# Patient Record
Sex: Male | Born: 1935
Health system: Southern US, Community
[De-identification: ages and names within clinical notes are randomized; demographics above are authoritative.]

## PROBLEM LIST (undated history)

## (undated) DIAGNOSIS — Z789 Other specified health status: Secondary | ICD-10-CM

## (undated) DIAGNOSIS — R21 Rash and other nonspecific skin eruption: Secondary | ICD-10-CM

## (undated) DIAGNOSIS — C61 Malignant neoplasm of prostate: Secondary | ICD-10-CM

## (undated) DIAGNOSIS — E785 Hyperlipidemia, unspecified: Secondary | ICD-10-CM

## (undated) DIAGNOSIS — I739 Peripheral vascular disease, unspecified: Secondary | ICD-10-CM

## (undated) DIAGNOSIS — I779 Disorder of arteries and arterioles, unspecified: Secondary | ICD-10-CM

## (undated) DIAGNOSIS — J449 Chronic obstructive pulmonary disease, unspecified: Secondary | ICD-10-CM

## (undated) DIAGNOSIS — E7800 Pure hypercholesterolemia, unspecified: Secondary | ICD-10-CM

## (undated) DIAGNOSIS — I10 Essential (primary) hypertension: Secondary | ICD-10-CM

## (undated) DIAGNOSIS — G8929 Other chronic pain: Secondary | ICD-10-CM

## (undated) DIAGNOSIS — F419 Anxiety disorder, unspecified: Secondary | ICD-10-CM

## (undated) DIAGNOSIS — K219 Gastro-esophageal reflux disease without esophagitis: Secondary | ICD-10-CM

## (undated) DIAGNOSIS — E1142 Type 2 diabetes mellitus with diabetic polyneuropathy: Principal | ICD-10-CM

## (undated) DIAGNOSIS — I4891 Unspecified atrial fibrillation: Secondary | ICD-10-CM

## (undated) DIAGNOSIS — Z951 Presence of aortocoronary bypass graft: Secondary | ICD-10-CM

## (undated) DIAGNOSIS — R9430 Abnormal result of cardiovascular function study, unspecified: Secondary | ICD-10-CM

## (undated) DIAGNOSIS — I2510 Atherosclerotic heart disease of native coronary artery without angina pectoris: Secondary | ICD-10-CM

## (undated) DIAGNOSIS — R519 Headache, unspecified: Secondary | ICD-10-CM

## (undated) DIAGNOSIS — K449 Diaphragmatic hernia without obstruction or gangrene: Secondary | ICD-10-CM

## (undated) DIAGNOSIS — D472 Monoclonal gammopathy: Secondary | ICD-10-CM

## (undated) DIAGNOSIS — IMO0002 Reserved for concepts with insufficient information to code with codable children: Secondary | ICD-10-CM

## (undated) DIAGNOSIS — R51 Headache: Secondary | ICD-10-CM

## (undated) HISTORY — PX: PROSTATE SURGERY: SHX751

## (undated) HISTORY — DX: Other chronic pain: G89.29

## (undated) HISTORY — DX: Abnormal result of cardiovascular function study, unspecified: R94.30

## (undated) HISTORY — DX: Anxiety disorder, unspecified: F41.9

## (undated) HISTORY — DX: Malignant neoplasm of prostate: C61

## (undated) HISTORY — DX: Hyperlipidemia, unspecified: E78.5

## (undated) HISTORY — DX: Atherosclerotic heart disease of native coronary artery without angina pectoris: I25.10

## (undated) HISTORY — DX: Other specified health status: Z78.9

## (undated) HISTORY — DX: Headache, unspecified: R51.9

## (undated) HISTORY — DX: Type 2 diabetes mellitus with diabetic polyneuropathy: E11.42

## (undated) HISTORY — PX: CARDIAC SURGERY: SHX584

## (undated) HISTORY — DX: Monoclonal gammopathy: D47.2

## (undated) HISTORY — DX: Headache: R51

## (undated) HISTORY — DX: Presence of aortocoronary bypass graft: Z95.1

## (undated) HISTORY — DX: Diaphragmatic hernia without obstruction or gangrene: K44.9

## (undated) HISTORY — DX: Pure hypercholesterolemia, unspecified: E78.00

## (undated) HISTORY — DX: Essential (primary) hypertension: I10

## (undated) HISTORY — DX: Unspecified atrial fibrillation: I48.91

## (undated) HISTORY — DX: Gastro-esophageal reflux disease without esophagitis: K21.9

## (undated) HISTORY — DX: Reserved for concepts with insufficient information to code with codable children: IMO0002

## (undated) HISTORY — DX: Chronic obstructive pulmonary disease, unspecified: J44.9

## (undated) HISTORY — DX: Disorder of arteries and arterioles, unspecified: I77.9

## (undated) HISTORY — PX: BLADDER SURGERY: SHX569

## (undated) HISTORY — DX: Rash and other nonspecific skin eruption: R21

## (undated) HISTORY — DX: Peripheral vascular disease, unspecified: I73.9

---

## 2001-04-24 ENCOUNTER — Encounter: Payer: Self-pay | Admitting: *Deleted

## 2001-04-25 ENCOUNTER — Inpatient Hospital Stay (HOSPITAL_COMMUNITY): Admission: RE | Admit: 2001-04-25 | Discharge: 2001-05-01 | Payer: Self-pay | Admitting: *Deleted

## 2001-04-26 ENCOUNTER — Encounter: Payer: Self-pay | Admitting: Thoracic Surgery (Cardiothoracic Vascular Surgery)

## 2001-04-27 ENCOUNTER — Encounter: Payer: Self-pay | Admitting: Thoracic Surgery (Cardiothoracic Vascular Surgery)

## 2001-04-28 ENCOUNTER — Encounter: Payer: Self-pay | Admitting: Thoracic Surgery (Cardiothoracic Vascular Surgery)

## 2001-04-29 ENCOUNTER — Encounter: Payer: Self-pay | Admitting: Thoracic Surgery (Cardiothoracic Vascular Surgery)

## 2001-06-05 ENCOUNTER — Encounter (HOSPITAL_COMMUNITY): Admission: RE | Admit: 2001-06-05 | Discharge: 2001-09-03 | Payer: Self-pay | Admitting: Cardiology

## 2001-07-19 ENCOUNTER — Encounter: Admission: RE | Admit: 2001-07-19 | Discharge: 2001-07-19 | Payer: Self-pay | Admitting: Urology

## 2001-07-19 ENCOUNTER — Encounter: Payer: Self-pay | Admitting: Urology

## 2001-07-23 ENCOUNTER — Ambulatory Visit (HOSPITAL_BASED_OUTPATIENT_CLINIC_OR_DEPARTMENT_OTHER): Admission: RE | Admit: 2001-07-23 | Discharge: 2001-07-23 | Payer: Self-pay | Admitting: Urology

## 2001-07-23 ENCOUNTER — Encounter: Payer: Self-pay | Admitting: Urology

## 2001-07-23 ENCOUNTER — Encounter (INDEPENDENT_AMBULATORY_CARE_PROVIDER_SITE_OTHER): Payer: Self-pay

## 2002-10-03 ENCOUNTER — Ambulatory Visit (HOSPITAL_BASED_OUTPATIENT_CLINIC_OR_DEPARTMENT_OTHER): Admission: RE | Admit: 2002-10-03 | Discharge: 2002-10-03 | Payer: Self-pay | Admitting: Urology

## 2002-10-03 ENCOUNTER — Encounter (INDEPENDENT_AMBULATORY_CARE_PROVIDER_SITE_OTHER): Payer: Self-pay | Admitting: *Deleted

## 2004-02-03 ENCOUNTER — Encounter (INDEPENDENT_AMBULATORY_CARE_PROVIDER_SITE_OTHER): Payer: Self-pay | Admitting: Specialist

## 2004-02-03 ENCOUNTER — Ambulatory Visit (HOSPITAL_COMMUNITY): Admission: RE | Admit: 2004-02-03 | Discharge: 2004-02-03 | Payer: Self-pay | Admitting: Gastroenterology

## 2004-10-07 ENCOUNTER — Encounter: Admission: RE | Admit: 2004-10-07 | Discharge: 2004-10-07 | Payer: Self-pay | Admitting: Urology

## 2004-10-14 ENCOUNTER — Ambulatory Visit (HOSPITAL_BASED_OUTPATIENT_CLINIC_OR_DEPARTMENT_OTHER): Admission: RE | Admit: 2004-10-14 | Discharge: 2004-10-14 | Payer: Self-pay | Admitting: Urology

## 2004-10-14 ENCOUNTER — Ambulatory Visit (HOSPITAL_COMMUNITY): Admission: RE | Admit: 2004-10-14 | Discharge: 2004-10-14 | Payer: Self-pay | Admitting: Urology

## 2005-06-14 ENCOUNTER — Ambulatory Visit: Payer: Self-pay | Admitting: Cardiology

## 2005-08-15 ENCOUNTER — Ambulatory Visit (HOSPITAL_BASED_OUTPATIENT_CLINIC_OR_DEPARTMENT_OTHER): Admission: RE | Admit: 2005-08-15 | Discharge: 2005-08-15 | Payer: Self-pay | Admitting: Urology

## 2005-08-15 ENCOUNTER — Ambulatory Visit (HOSPITAL_COMMUNITY): Admission: RE | Admit: 2005-08-15 | Discharge: 2005-08-15 | Payer: Self-pay | Admitting: Urology

## 2005-08-15 ENCOUNTER — Encounter (INDEPENDENT_AMBULATORY_CARE_PROVIDER_SITE_OTHER): Payer: Self-pay | Admitting: *Deleted

## 2006-06-14 ENCOUNTER — Ambulatory Visit: Payer: Self-pay | Admitting: Cardiology

## 2006-08-30 ENCOUNTER — Ambulatory Visit (HOSPITAL_COMMUNITY): Admission: RE | Admit: 2006-08-30 | Discharge: 2006-08-30 | Payer: Self-pay | Admitting: Urology

## 2006-09-25 ENCOUNTER — Ambulatory Visit: Admission: RE | Admit: 2006-09-25 | Discharge: 2007-02-21 | Payer: Self-pay | Admitting: Radiation Oncology

## 2006-12-21 ENCOUNTER — Ambulatory Visit: Admission: RE | Admit: 2006-12-21 | Discharge: 2007-02-21 | Payer: Self-pay | Admitting: Radiation Oncology

## 2007-02-18 ENCOUNTER — Encounter: Admission: RE | Admit: 2007-02-18 | Discharge: 2007-02-18 | Payer: Self-pay | Admitting: Surgery

## 2007-02-25 ENCOUNTER — Ambulatory Visit (HOSPITAL_BASED_OUTPATIENT_CLINIC_OR_DEPARTMENT_OTHER): Admission: RE | Admit: 2007-02-25 | Discharge: 2007-02-25 | Payer: Self-pay | Admitting: Urology

## 2007-03-18 ENCOUNTER — Ambulatory Visit: Admission: RE | Admit: 2007-03-18 | Discharge: 2007-04-17 | Payer: Self-pay | Admitting: Radiation Oncology

## 2007-07-02 ENCOUNTER — Ambulatory Visit: Payer: Self-pay | Admitting: Cardiology

## 2007-09-25 ENCOUNTER — Ambulatory Visit: Payer: Self-pay

## 2007-10-01 ENCOUNTER — Inpatient Hospital Stay (HOSPITAL_COMMUNITY): Admission: EM | Admit: 2007-10-01 | Discharge: 2007-10-02 | Payer: Self-pay | Admitting: *Deleted

## 2007-10-01 ENCOUNTER — Encounter: Payer: Self-pay | Admitting: Cardiology

## 2007-10-01 ENCOUNTER — Ambulatory Visit: Payer: Self-pay | Admitting: Internal Medicine

## 2007-10-11 ENCOUNTER — Ambulatory Visit: Payer: Self-pay

## 2007-10-15 ENCOUNTER — Ambulatory Visit: Payer: Self-pay | Admitting: Cardiology

## 2007-11-19 ENCOUNTER — Ambulatory Visit: Payer: Self-pay | Admitting: Cardiology

## 2007-12-04 ENCOUNTER — Ambulatory Visit: Payer: Self-pay | Admitting: Cardiology

## 2007-12-30 ENCOUNTER — Ambulatory Visit: Payer: Self-pay | Admitting: Cardiology

## 2008-02-05 ENCOUNTER — Ambulatory Visit: Payer: Self-pay | Admitting: Cardiology

## 2008-04-14 ENCOUNTER — Ambulatory Visit: Payer: Self-pay

## 2008-04-14 ENCOUNTER — Encounter: Payer: Self-pay | Admitting: Cardiology

## 2008-07-30 ENCOUNTER — Ambulatory Visit: Payer: Self-pay | Admitting: Cardiology

## 2008-09-09 ENCOUNTER — Emergency Department (HOSPITAL_COMMUNITY): Admission: EM | Admit: 2008-09-09 | Discharge: 2008-09-09 | Payer: Self-pay | Admitting: Emergency Medicine

## 2008-09-19 ENCOUNTER — Emergency Department (HOSPITAL_COMMUNITY): Admission: EM | Admit: 2008-09-19 | Discharge: 2008-09-19 | Payer: Self-pay | Admitting: Emergency Medicine

## 2008-10-14 ENCOUNTER — Ambulatory Visit: Payer: Self-pay

## 2009-01-21 ENCOUNTER — Ambulatory Visit: Payer: Self-pay | Admitting: Cardiology

## 2009-04-14 ENCOUNTER — Encounter: Payer: Self-pay | Admitting: Cardiology

## 2009-04-14 ENCOUNTER — Ambulatory Visit: Payer: Self-pay

## 2009-07-19 ENCOUNTER — Encounter: Payer: Self-pay | Admitting: Cardiology

## 2009-07-20 ENCOUNTER — Ambulatory Visit: Payer: Self-pay | Admitting: Cardiology

## 2009-10-26 ENCOUNTER — Encounter: Payer: Self-pay | Admitting: Cardiology

## 2009-10-27 ENCOUNTER — Encounter: Payer: Self-pay | Admitting: Cardiology

## 2009-10-27 ENCOUNTER — Ambulatory Visit: Payer: Self-pay

## 2010-02-17 ENCOUNTER — Encounter: Payer: Self-pay | Admitting: Cardiology

## 2010-02-18 ENCOUNTER — Ambulatory Visit: Payer: Self-pay | Admitting: Cardiology

## 2010-04-27 ENCOUNTER — Encounter: Payer: Self-pay | Admitting: Cardiology

## 2010-04-28 ENCOUNTER — Ambulatory Visit: Payer: Self-pay

## 2010-04-28 ENCOUNTER — Encounter: Payer: Self-pay | Admitting: Cardiology

## 2010-07-09 IMAGING — CT CT HEAD W/O CM
3 series · 17 of 37 positions shown, 19 images · non-contrast
Comparison: None

CT HEAD

CLINICAL DATA: Fall.

CT HEAD WITHOUT CONTRAST
CT CERVICAL SPINE WITHOUT CONTRAST
TECHNIQUE: Multidetector CT imaging of the head and cervical spine
was performed following the standard protocol without intravenous
contrast.  Multiplanar CT image reconstructions of the cervical
spine were also generated.

[Series 3: (id) head 4.8 h37s st · axial · 0.50mm/px · z∈[+1126,+1253]mm · 6 of 36 slices shown, 8 images]
[im 6/36  brain]
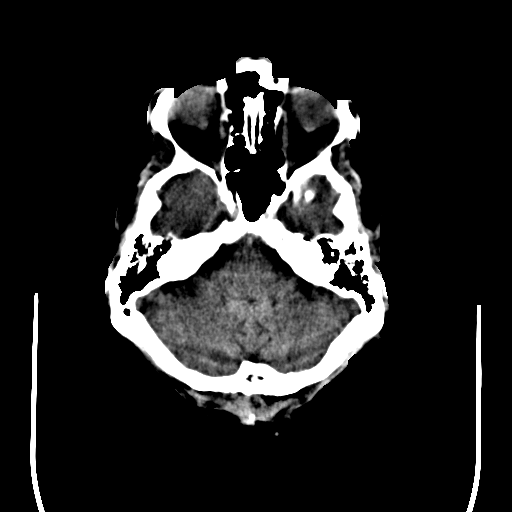
[im 6/36  bone]
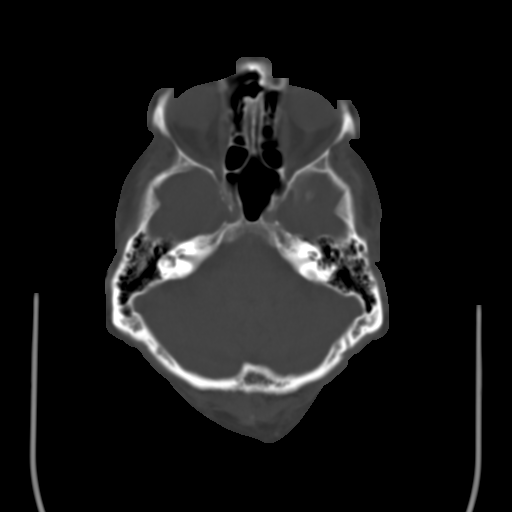
[im 11/36  brain]
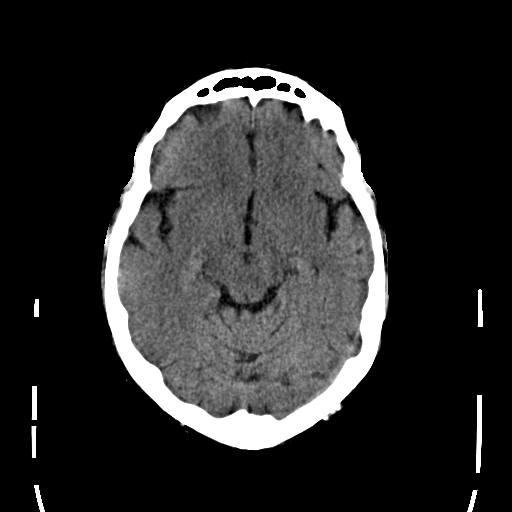
[im 16/36  brain]
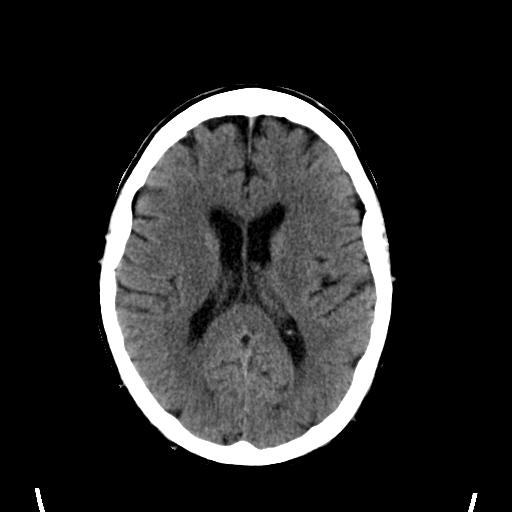
[im 21/36  brain]
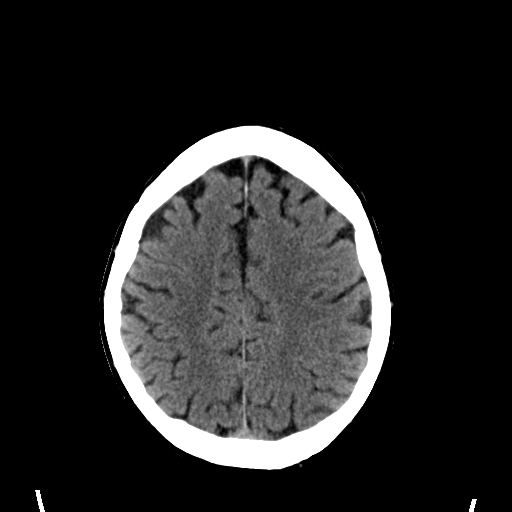
[im 26/36  brain]
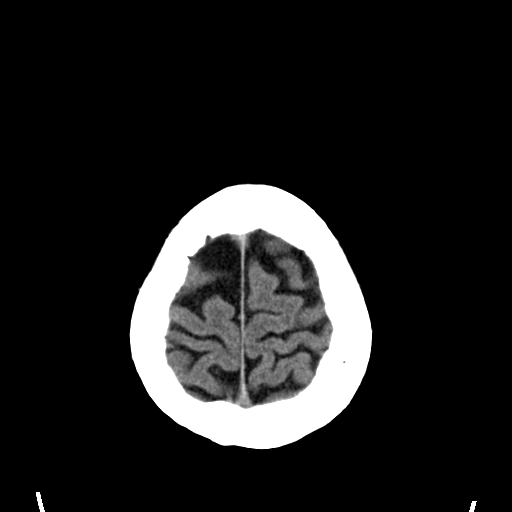
[im 26/36  bone]
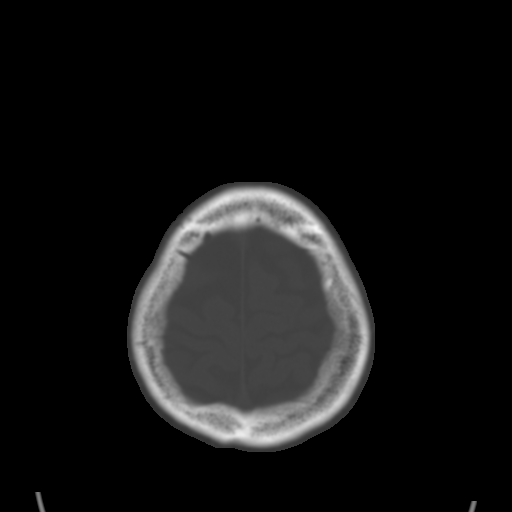
[im 31/36  brain]
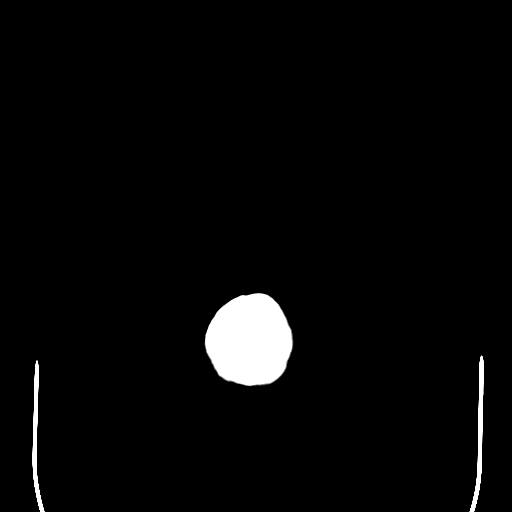

[Series 6: c_spine 2.0 b31s detail · axial · 0.27mm/px · z∈[+925,+1077]mm · 8 of 94 slices shown]
[im 9/94  bone]
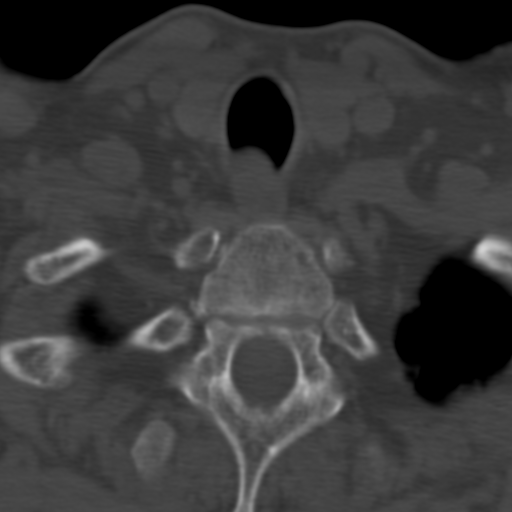
[im 18/94  bone]
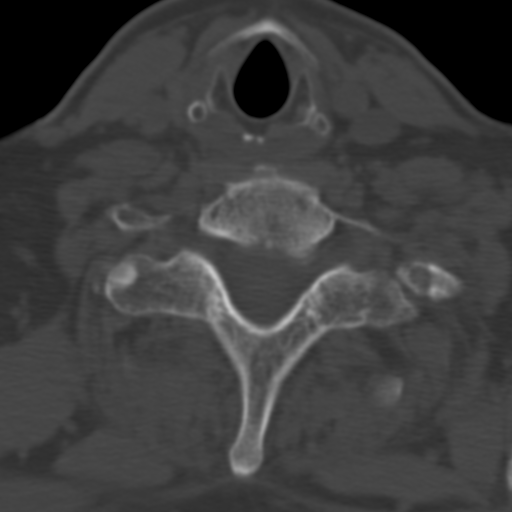
[im 32/94  bone]
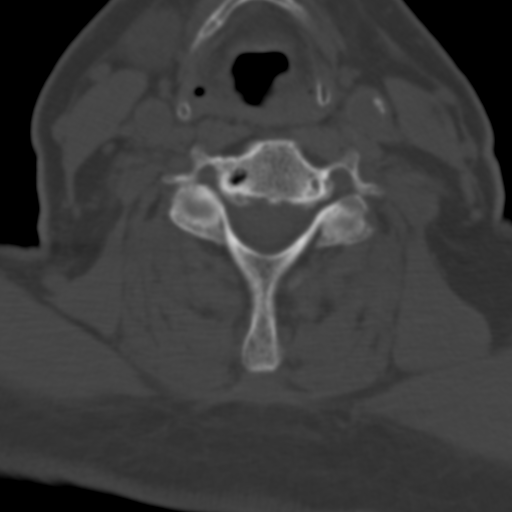
[im 40/94  bone]
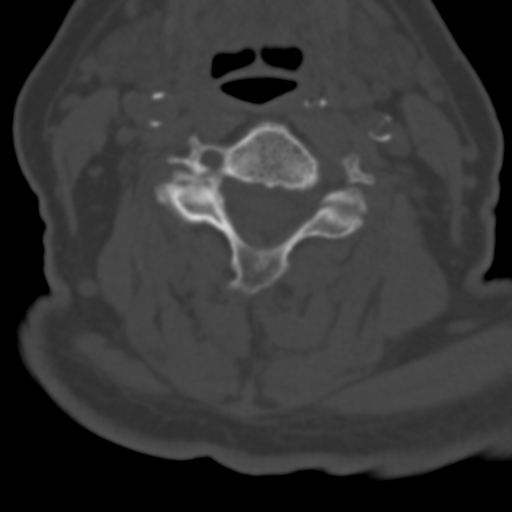
[im 54/94  bone]
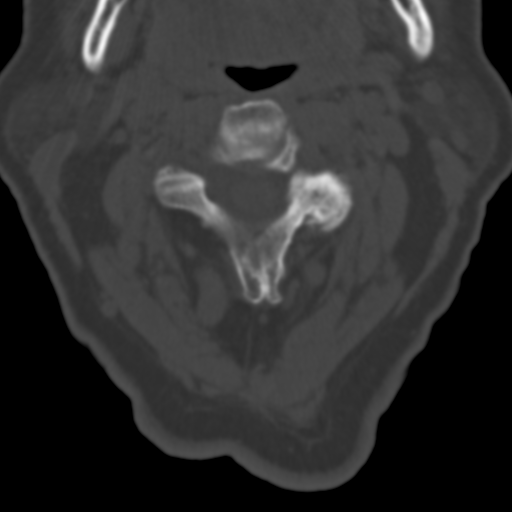
[im 63/94  bone]
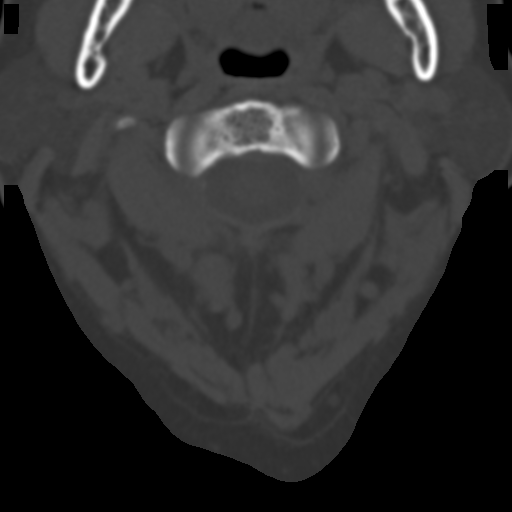
[im 76/94  bone]
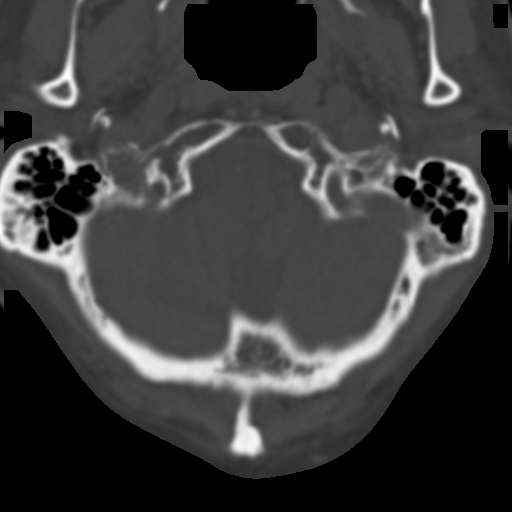
[im 85/94  bone]
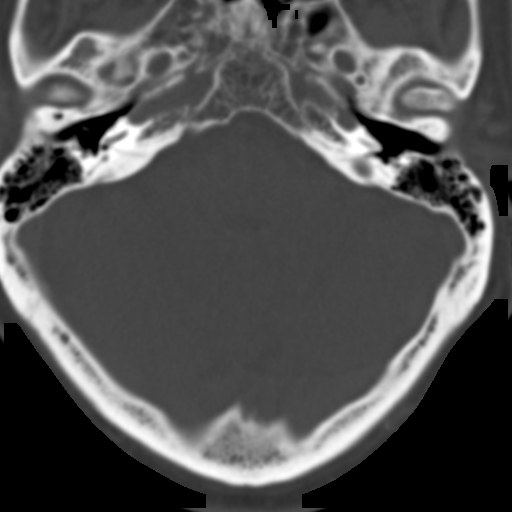

[Series 10: c_spine 3.0 spo sag thins · sagittal · 0.37mm/px · 3 of 39 slices shown]
[im 13/39  brain]
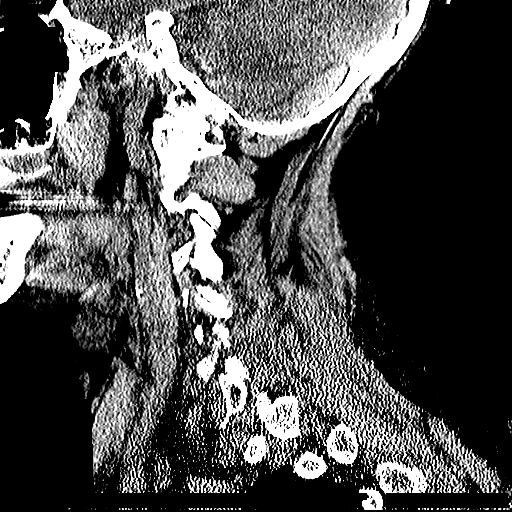
[im 20/39  brain]
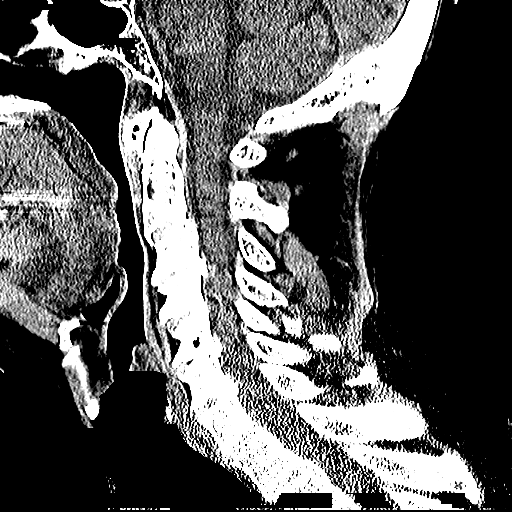
[im 26/39  brain]
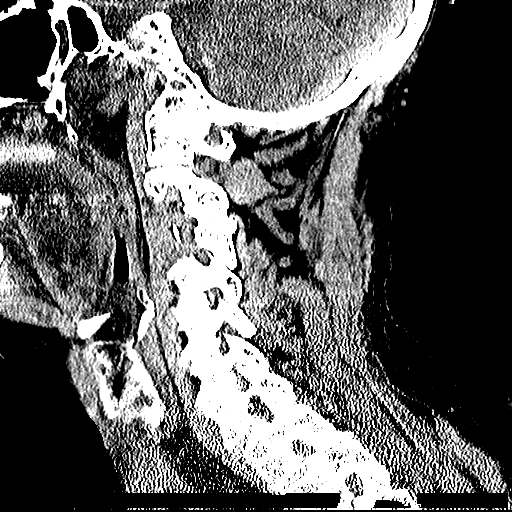

[17 of 37 positions shown; findings below may reference images not displayed]

FINDINGS: The ventricles are normal.  Negative for intracranial
hemorrhage.  Small area of  ischemia in the right internal capsule
is probably chronic.  There is no acute infarct or mass.  Negative
for skull fracture.
IMPRESSION: No acute abnormality.

CT CERVICAL SPINE
FINDINGS: Negative for fracture.  Disc degeneration and spondylosis
is noted at C3-4 especially on the right.  There is diffuse
uncinate spurring at C5-6 and C6-7.  There is facet degeneration at
multiple levels.  There is mild anterior slip of C2 and C3.
IMPRESSION: Negative for fracture.

## 2010-08-17 ENCOUNTER — Encounter: Payer: Self-pay | Admitting: Cardiology

## 2010-08-18 ENCOUNTER — Ambulatory Visit: Payer: Self-pay | Admitting: Cardiology

## 2010-08-18 ENCOUNTER — Encounter: Payer: Self-pay | Admitting: Cardiology

## 2010-08-29 ENCOUNTER — Telehealth (INDEPENDENT_AMBULATORY_CARE_PROVIDER_SITE_OTHER): Payer: Self-pay | Admitting: Radiology

## 2010-08-30 ENCOUNTER — Encounter: Payer: Self-pay | Admitting: Cardiology

## 2010-08-30 ENCOUNTER — Encounter (HOSPITAL_COMMUNITY)
Admission: RE | Admit: 2010-08-30 | Discharge: 2010-10-18 | Payer: Self-pay | Source: Home / Self Care | Attending: Cardiology | Admitting: Cardiology

## 2010-08-30 ENCOUNTER — Ambulatory Visit: Payer: Self-pay

## 2010-09-21 ENCOUNTER — Encounter: Payer: Self-pay | Admitting: Cardiology

## 2010-09-22 ENCOUNTER — Ambulatory Visit
Admission: RE | Admit: 2010-09-22 | Discharge: 2010-09-22 | Payer: Self-pay | Source: Home / Self Care | Attending: Cardiology | Admitting: Cardiology

## 2010-09-22 ENCOUNTER — Encounter: Payer: Self-pay | Admitting: Cardiology

## 2010-10-18 NOTE — Miscellaneous (Signed)
  Clinical Lists Changes  Observations: Added new observation of US CAROTID: Stable, carotid artery disease, bilaterally. 0-39% RICA stenosis 60-79% LICA stenosis  f/u 6 months (10/27/2009 10:37)      Carotid Doppler  Procedure date:  10/27/2009  Findings:      Stable, carotid artery disease, bilaterally. 0-39% RICA stenosis 60-79% LICA stenosis  f/u 6 months

## 2010-10-18 NOTE — Assessment & Plan Note (Signed)
Summary: f/u 6 months   Visit Type:  Follow-up Primary Provider:  Benedetto Goad MD  CC:  CAD.  History of Present Illness: The patient is seen for cardiology followup.  He's having some mild chest discomfort.  It is not clear if it's GI or cardiac.  He is able to exercise 3 days a week at the [facility].  Current Medications (verified): 1)  Plavix 75 Mg Tabs (Clopidogrel Bisulfate) .... Take 1 Tablet By Mouth Once A Day 2)  Triamterene-Hctz 37.5-25 Mg Tabs (Triamterene-Hctz) .... Take 1 Tablet By Mouth Once A Day 3)  Calcium 500/vitamin D 500-125 Mg-Unit Tabs (Calcium Carbonate-Vitamin D) .... Take One Tablet By Mouth Once Daily. 4)  Toprol Xl 50 Mg Xr24h-Tab (Metoprolol Succinate) .... 1/2 Tab By Mouth Once Daily 5)  Aspirin 81 Mg Tbec (Aspirin) .... Take One Tablet By Mouth Daily 6)  Vitamin C 500 Mg  Tabs (Ascorbic Acid) .... Take One Tablet By Mouth Once Daily. 7)  Vitamin D 1000 Unit  Tabs (Cholecalciferol) .... Take One Tablet By Mouth Once Daily. 8)  Fish Oil   Oil (Fish Oil) .... 2 Tabs Two Times A Day 9)  Prilosec Otc 20 Mg Tbec (Omeprazole Magnesium) .... As Needed 10)  Vitamin B-12 1000 Mcg Tabs (Cyanocobalamin) .... Once Daily  Allergies: 1)  ! Aciphex 2)  ! * Has Allergies To Other Meds, But Does Not Know The Names.  Past History:  Past Medical History: hiatal herniawe will Hypertension treated with meds changing over time Hyperlipidemia.Marland Kitchen we have never been successful starting a statin COPD Mild anxiety GERD Chronic headaches Prostate cancer treated with TUR and an implant.. CAD....DES proximal circumflex.. January, 2009 CABG 2002 Dr. Dorris Fetch Drug-eluting stent to 95% proximal circumflex January 2009 (grafts patent at that time) Ejection fraction 60%...catheterization... 2009  ( no echo data as of August 17, 2010) Carotid stenosis 60-79% LICA.  /  Doppler July, 2010.Marland Kitchen 0-39% RICA... 60-79% LICA /   Doppler.. October 27, 2009...Marland KitchenMarland KitchenMarland Kitchen stable 0-39% R. ICA, 60-79%  LICA... followup 6 months /  0-39% RICA, 60-79%LICA  Doppler... April 28, 2010 Rash in early 2009 etiology not clear.. not from Plavix.. Claritin helped  Review of Systems       Patient denies fever, chills, headache, sweats, rash, change in vision, change in hearing, shortness of breath, cough, nausea vomiting, urinary symptoms.  All of the systems are reviewed and are negative.  Vital Signs:  Patient profile:   77 year old male Height:      69 inches Weight:      192 pounds BMI:     28.46 Pulse rate:   64 / minute BP sitting:   110 / 68  (left arm)  Vitals Entered By: Laurance Flatten CMA (August 18, 2010 10:09 AM)  Physical Exam  General:  patient looks quite good. Head:  head is atraumatic. Eyes:  no xanthelasma. Neck:  no jugular venous extension. Chest Wall:  no chest wall tenderness. Lungs:  lungs are clear.  Respiratory effort is nonlabored. Heart:  cardiac exam reveals S1-S2.  No clicks or significant murmurs. Abdomen:  abdomen is soft. Msk:  no musculoskeletal deformities. Extremities:  no peripheral edema. Skin:  no skin rashes. Psych:  patient is oriented to person time and place.  Affect is normal.   Impression & Recommendations:  Problem # 1:  * STRESS OF CARING FOR HIS WIFE. This issue continues but the patient does best he can.  Problem # 2:  CAROTID ARTERY STENOSIS (  ICD-433.10)  His updated medication list for this problem includes:    Plavix 75 Mg Tabs (Clopidogrel bisulfate) .Marland Kitchen... Take 1 tablet by mouth once a day    Aspirin 81 Mg Tbec (Aspirin) .Marland Kitchen... Take one tablet by mouth daily The patient had followup carotid Dopplers in August, 2011.  Stable.  Problem # 3:  CAD (ICD-414.00)  His updated medication list for this problem includes:    Plavix 75 Mg Tabs (Clopidogrel bisulfate) .Marland Kitchen... Take 1 tablet by mouth once a day    Toprol Xl 50 Mg Xr24h-tab (Metoprolol succinate) .Marland Kitchen... 1/2 tab by mouth once daily    Aspirin 81 Mg Tbec (Aspirin) .Marland Kitchen... Take one  tablet by mouth daily the patient is having some chest discomfort.  It could be GI.  His last intervention was 2009.  We need an exercise nuclear scan for further information and this will be scheduled.  EKG done today and reviewed by me.  I compared it to the old tracing.  Patient has old diffuse ST-T wave changes.  There is no significant change.  Orders: Nuclear Stress Test (Nuc Stress Test) EKG w/ Interpretation (93000)  Problem # 4:  GERD (ICD-530.81) The patient has GERD.  I've encouraged him to use a PPI but not omeprazole.I'll see him for cardiology followup.  Patient Instructions: 1)  Your physician recommends that you schedule a follow-up appointment in: 5 weeks with Dr. Myrtis Ser 2)  Your physician recommends that you continue on your current medications as directed. Please refer to the Current Medication list given to you today. 3)  Your physician has requested that you have an exercise stress myoview.  For further information please visit [email or web address]  Please follow instruction sheet, as given.

## 2010-10-18 NOTE — Miscellaneous (Signed)
  Clinical Lists Changes  Observations: Added new observation of PAST MED HX: hiatal herniawe will Hypertension treated with meds changing over time Hyperlipidemia.Marland Kitchen we have never been successful starting a statin COPD Mild anxiety GERD Chronic headaches Prostate cancer treated with TUR and an implant CAD CABG 2002 Dr. Dorris Fetch Drug-eluting stent to 95% proximal circumflex January 2009 (grafts patent at that time) Ejection fraction 60% Carotid stenosis 60-79% LICA.  /  Doppler July, 2010.Marland Kitchen 0-39% RICA... 60-79% LICA Rash in early 2009 etiology not clear.. not from Plavix.. Claritin helped (07/19/2009 13:51) Added new observation of PRIMARY MD: Benedetto Goad MD (07/19/2009 13:51)       Past History:  Past Medical History: hiatal herniawe will Hypertension treated with meds changing over time Hyperlipidemia.Marland Kitchen we have never been successful starting a statin COPD Mild anxiety GERD Chronic headaches Prostate cancer treated with TUR and an implant CAD CABG 2002 Dr. Dorris Fetch Drug-eluting stent to 95% proximal circumflex January 2009 (grafts patent at that time) Ejection fraction 60% Carotid stenosis 60-79% LICA.  /  Doppler July, 2010.Marland Kitchen 0-39% RICA... 60-79% LICA Rash in early 2009 etiology not clear.. not from Plavix.. Claritin helped

## 2010-10-18 NOTE — Miscellaneous (Signed)
Summary: Orders Update  Clinical Lists Changes  Orders: Added new Test order of Carotid Duplex (Carotid Duplex) - Signed 

## 2010-10-18 NOTE — Assessment & Plan Note (Signed)
Summary: f67m   Visit Type:  Follow-up Primary Provider:  Benedetto Goad MD  CC:  CAD.  History of Present Illness: The patient is seen for followup of coronary disease.  Is not having any significant chest pain.  He does exercise regularly.  His wife is limited to the house and he has to help her physically all day long.  Because of this he did not get away from the house on a regular basis.  He does exercise in the gym.  He would like to ride a bicycle I cannot get away from his home.  He does work outside.  He describes some chest heaviness but it is usually related to soreness the day after he's done work.  It does not sound like he is having significant ischemia.  He has not had syncope or presyncope.  Current Medications (verified): 1)  Plavix 75 Mg Tabs (Clopidogrel Bisulfate) .... Take 1 Tablet By Mouth Once A Day 2)  Triamterene-Hctz 37.5-25 Mg Tabs (Triamterene-Hctz) .... Take 1 Tablet By Mouth Once A Day 3)  Calcium 500/vitamin D 500-125 Mg-Unit Tabs (Calcium Carbonate-Vitamin D) .... Take One Tablet By Mouth Once Daily. 4)  Toprol Xl 50 Mg Xr24h-Tab (Metoprolol Succinate) .... 1/2 Tab By Mouth Once Daily 5)  Aspirin 81 Mg Tbec (Aspirin) .... Take One Tablet By Mouth Daily 6)  Vitamin C 500 Mg  Tabs (Ascorbic Acid) .... Take One Tablet By Mouth Once Daily. 7)  Vitamin D 1000 Unit  Tabs (Cholecalciferol) .... Take One Tablet By Mouth Once Daily. 8)  Fish Oil   Oil (Fish Oil) .... 2 Tabs Two Times A Day 9)  Prilosec Otc 20 Mg Tbec (Omeprazole Magnesium) .... As Needed 10)  Vitamin B-12 1000 Mcg Tabs (Cyanocobalamin) .... Take One Tablet By Mouth Once Daily.  Allergies (verified): 1)  ! Aciphex 2)  ! * Has Allergies To Other Meds, But Does Not Know The Names.  Past History:  Past Medical History: hiatal herniawe will Hypertension treated with meds changing over time Hyperlipidemia.Marland Kitchen we have never been successful starting a statin COPD Mild anxiety GERD Chronic  headaches Prostate cancer treated with TUR and an implant CAD....DES proximal circumflex.. January, 2009 CABG 2002 Dr. Dorris Fetch Drug-eluting stent to 95% proximal circumflex January 2009 (grafts patent at that time) Ejection fraction 60%...catheterization... 2009 Carotid stenosis 60-79% LICA.  /  Doppler July, 2010.Marland Kitchen 0-39% RICA... 60-79% LICA Rash in early 2009 etiology not clear.. not from Plavix.. Claritin helped  Review of Systems       The patient denies fever, chills, headache, sweats, rash, change in vision, change in hearing, chest pain, shortness of breath, cough, nausea vomiting, urinary symptoms.  All other systems are reviewed and are negative.  Vital Signs:  Patient profile:   77 year old male Height:      69 inches Weight:      188 pounds BMI:     27.86 Pulse rate:   58 / minute BP sitting:   130 / 76  (left arm)  Vitals Entered By: Laurance Flatten CMA (July 20, 2009 11:12 AM)  Physical Exam  General:  patient is quite stable today. Eyes:  no xanthelasma. Neck:  no jugular venous distention. Lungs:  lungs are clear.  Respiratory effort is nonlabored. Heart:  cardiac exam reveals S1-S2.  No clicks or significant murmurs. Abdomen:  abdomen is soft. Msk:  no musculoskeletal deformities. Extremities:  no peripheral edema. Psych:  patient is oriented to person time and place.  Affect is normal.   Impression & Recommendations:  Problem # 1:  CAROTID ARTERY STENOSIS (ICD-433.10)  His updated medication list for this problem includes:    Plavix 75 Mg Tabs (Clopidogrel bisulfate) .Marland Kitchen... Take 1 tablet by mouth once a day    Aspirin 81 Mg Tbec (Aspirin) .Marland Kitchen... Take one tablet by mouth daily Patient has had followup Dopplers and his carotids are stable.  No change in therapy.  Problem # 2:  CAD (ICD-414.00)  His updated medication list for this problem includes:    Plavix 75 Mg Tabs (Clopidogrel bisulfate) .Marland Kitchen... Take 1 tablet by mouth once a day    Toprol Xl 50 Mg  Xr24h-tab (Metoprolol succinate) .Marland Kitchen... 1/2 tab by mouth once daily    Aspirin 81 Mg Tbec (Aspirin) .Marland Kitchen... Take one tablet by mouth daily Coronary disease is stable.  I have carefully questioned him and I feel his current symptoms are noncardiac in origin. EKG is done today and reviewed by me.  There is sinus bradycardia and old inverted T waves diffusely.  There is no significant change.  Problem # 3:  ESSENTIAL HYPERTENSION, BENIGN (ICD-401.1)  His updated medication list for this problem includes:    Triamterene-hctz 37.5-25 Mg Tabs (Triamterene-hctz) .Marland Kitchen... Take 1 tablet by mouth once a day    Toprol Xl 50 Mg Xr24h-tab (Metoprolol succinate) .Marland Kitchen... 1/2 tab by mouth once daily    Aspirin 81 Mg Tbec (Aspirin) .Marland Kitchen... Take one tablet by mouth daily Blood pressure is under good control.  No change in therapy.  Problem # 4:  * STRESS OF CARING FOR HIS WIFE. Patient is totally committed to helping take care of his wife.  He is very positive about this but admits that it is very large job and limits his ability to get out of his home.  Other Orders: EKG w/ Interpretation (93000)  Patient Instructions: 1)  Your physician recommends that you schedule a follow-up appointment in: 6 months. The office will mail a reminder 1 to 2 months prior appointment date.

## 2010-10-18 NOTE — Assessment & Plan Note (Signed)
Summary: 6 month/dmp  Medications Added PLAVIX 75 MG TABS (CLOPIDOGREL BISULFATE) Take 1 tablet by mouth once a day TRIAMTERENE-HCTZ 37.5-25 MG TABS (TRIAMTERENE-HCTZ) Take 1 tablet by mouth once a day CALCIUM 500/VITAMIN D 500-125 MG-UNIT TABS (CALCIUM CARBONATE-VITAMIN D) Take one tablet by mouth once daily. ACIPHEX 20 MG TBEC (RABEPRAZOLE SODIUM) Take one tablet by mouth once daily. TOPROL XL 50 MG XR24H-TAB (METOPROLOL SUCCINATE) 1/2 tab by mouth once daily ASPIRIN 81 MG TBEC (ASPIRIN) Take one tablet by mouth daily VITAMIN C 500 MG  TABS (ASCORBIC ACID) Take one tablet by mouth once daily. VITAMIN D 1000 UNIT  TABS (CHOLECALCIFEROL) Take one tablet by mouth once daily. LOVAZA 1 GM CAPS (OMEGA-3-ACID ETHYL ESTERS) Take two tablets by mouth two times a day BUDEPRION XL 150 MG XR24H-TAB (BUPROPION HCL) Take one tablet by mouth once daily.        Visit Type:  Follow-up Primary Provider:  Benedetto Goad MD  CC:  Followup coronary artery disease.  History of Present Illness: The patient is doing well and initially with his coronary disease he had a pressing squeezing sensation in his anterior chest. He has not had any of this sensation. He does mention to me than walking on the treadmill he has the discomfort and under his right shoulder blade. Also he has some shortness of breath at night. At the same time he was able to scrub wooden floors for 4 hours in the past few weeks with no problems. I believe that his shortness of breath he is merely the fact that he takes a deep breath from time to time. I feel that his shoulder discomfort is not ischemic in origin. I have considered a stress Myoview scan but decided not to because his exercise level is so high at this time.   Current Medications (verified): 1)  Plavix 75 Mg Tabs (Clopidogrel Bisulfate) .... Take 1 Tablet By Mouth Once A Day 2)  Triamterene-Hctz 37.5-25 Mg Tabs (Triamterene-Hctz) .... Take 1 Tablet By Mouth Once A Day 3)  Calcium  500/vitamin D 500-125 Mg-Unit Tabs (Calcium Carbonate-Vitamin D) .... Take One Tablet By Mouth Once Daily. 4)  Aciphex 20 Mg Tbec (Rabeprazole Sodium) .... Take One Tablet By Mouth Once Daily. 5)  Toprol Xl 50 Mg Xr24h-Tab (Metoprolol Succinate) .... 1/2 Tab By Mouth Once Daily 6)  Aspirin 81 Mg Tbec (Aspirin) .... Take One Tablet By Mouth Daily 7)  Vitamin C 500 Mg  Tabs (Ascorbic Acid) .... Take One Tablet By Mouth Once Daily. 8)  Vitamin D 1000 Unit  Tabs (Cholecalciferol) .... Take One Tablet By Mouth Once Daily. 9)  Lovaza 1 Gm Caps (Omega-3-Acid Ethyl Esters) .... Take Two Tablets By Mouth Two Times A Day 10)  Budeprion Xl 150 Mg Xr24h-Tab (Bupropion Hcl) .... Take One Tablet By Mouth Once Daily.  Past History:  Past Medical History:    hiatal hernia    Hypertension treated with meds changing over time    Hyperlipidemia.Marland Kitchen we have never been successful starting a statin    COPD    Mild anxiety    GERD    Chronic headaches    Prostate cancer treated with TUR and an implant    CAD    CABG 2002 Dr. Dorris Fetch    Drug-eluting stent to 95% proximal circumflex January 2009 (grafts patent at that time)    Ejection fraction 60%    Carotid stenosis 60-79% LICA    Rash in early 2009 etiology not clear.. not from Plavix.. Claritin  helped (07/30/2008)  Review of Systems       the patient is quite stable he has no fevers or chills. No skin rashes at this time. He has no headaches. There's no change in his vision or his hearing. There is no cough. As mentioned he has some shortness of breath only Down at night and this is not PND or orthopnea area he takes a deep breath and he is fine. He's not having any of his classic chest discomfort which is a squeezing in the anterior aspect of his chest. He has no GI or GU symptoms. There is no swelling. All other systems are reviewed and are negative.  Vital Signs:  Patient profile:   77 year old male Height:      69 inches Weight:      184  pounds BMI:     27.27 Pulse rate:   57 / minute BP sitting:   96 / 64  (right arm)  Vitals Entered By: Laurance Flatten CMA (Jan 21, 2009 9:37 AM)  Physical Exam  General:  In general he is quite stable. Eyes:  No xanthelasma there is normal extraocular motion. Neck:  There is no jugular venous distention. Lungs:  Lungs clear. Heart:  Cardiac exam normal S1-S2. No clicks or significant murmurs. Abdomen:  The abdomen is soft. Extremities:  There is no significant peripheral edema. Psych:  The patient is oriented to person time and place. Affect is normal.   Impression & Recommendations:  Problem # 1:  CAROTID ARTERY STENOSIS (ICD-433.10)  His updated medication list for this problem includes:    Plavix 75 Mg Tabs (Clopidogrel bisulfate) .Marland Kitchen... Take 1 tablet by mouth once a day    Aspirin 81 Mg Tbec (Aspirin) .Marland Kitchen... Take one tablet by mouth daily H. and sclerotic diseases followed with ongoing Dopplers.  Orders: EKG w/ Interpretation (93000)  His updated medication list for this problem includes:    Plavix 75 Mg Tabs (Clopidogrel bisulfate) .Marland Kitchen... Take 1 tablet by mouth once a day    Aspirin 81 Mg Tbec (Aspirin) .Marland Kitchen... Take one tablet by mouth daily  Problem # 2:  CAD (ICD-414.00)  His updated medication list for this problem includes:    Plavix 75 Mg Tabs (Clopidogrel bisulfate) .Marland Kitchen... Take 1 tablet by mouth once a day    Toprol Xl 50 Mg Xr24h-tab (Metoprolol succinate) .Marland Kitchen... 1/2 tab by mouth once daily    Aspirin 81 Mg Tbec (Aspirin) .Marland Kitchen... Take one tablet by mouth daily The patient had an intervention in January of 09. I have considered a Myoview scan but decided not to do that at this time. I believe that his shoulder discomfort is not ischemic. He is not having any of his usual anterior chest discomfort. I will see him back in 6 months to reconsider.  His updated medication list for this problem includes:    Plavix 75 Mg Tabs (Clopidogrel bisulfate) .Marland Kitchen... Take 1 tablet by mouth  once a day    Toprol Xl 50 Mg Xr24h-tab (Metoprolol succinate) .Marland Kitchen... 1/2 tab by mouth once daily    Aspirin 81 Mg Tbec (Aspirin) .Marland Kitchen... Take one tablet by mouth daily  Problem # 3:  HYPERLIPIDEMIA (ICD-272.4)  His updated medication list for this problem includes:    Lovaza 1 Gm Caps (Omega-3-acid ethyl esters) .Marland Kitchen... Take two tablets by mouth two times a day Unfortunately he cannot use a statin.  His updated medication list for this problem includes:    Lovaza 1 Gm  Caps (Omega-3-acid ethyl esters) .Marland Kitchen... Take two tablets by mouth two times a day  Problem # 4:  ESSENTIAL HYPERTENSION, BENIGN (ICD-401.1)  His updated medication list for this problem includes:    Triamterene-hctz 37.5-25 Mg Tabs (Triamterene-hctz) .Marland Kitchen... Take 1 tablet by mouth once a day    Toprol Xl 50 Mg Xr24h-tab (Metoprolol succinate) .Marland Kitchen... 1/2 tab by mouth once daily    Aspirin 81 Mg Tbec (Aspirin) .Marland Kitchen... Take one tablet by mouth daily Blood pressure is under good control at this time. No change in his meds.  His updated medication list for this problem includes:    Triamterene-hctz 37.5-25 Mg Tabs (Triamterene-hctz) .Marland Kitchen... Take 1 tablet by mouth once a day    Toprol Xl 50 Mg Xr24h-tab (Metoprolol succinate) .Marland Kitchen... 1/2 tab by mouth once daily    Aspirin 81 Mg Tbec (Aspirin) .Marland Kitchen... Take one tablet by mouth daily  Patient Instructions: 1)  Follow up in 6 months

## 2010-10-18 NOTE — Miscellaneous (Signed)
  Clinical Lists Changes  Observations: Added new observation of PAST MED HX: hiatal herniawe will Hypertension treated with meds changing over time Hyperlipidemia.Marland Kitchen we have never been successful starting a statin COPD Mild anxiety GERD Chronic headaches Prostate cancer treated with TUR and an implant CAD....DES proximal circumflex.. January, 2009 CABG 2002 Dr. Dorris Fetch Drug-eluting stent to 95% proximal circumflex January 2009 (grafts patent at that time) Ejection fraction 60%...catheterization... 2009  ( no echo data as of August 17, 2010) Carotid stenosis 60-79% LICA.  /  Doppler July, 2010.Marland Kitchen 0-39% RICA... 60-79% LICA /   Doppler.. October 27, 2009...Marland KitchenMarland KitchenMarland Kitchen stable 0-39% R. ICA, 60-79% LICA... followup 6 months /  0-39% RICA, 60-79%LICA  Doppler... April 28, 2010 Rash in early 2009 etiology not clear.. not from Plavix.. Claritin helped (08/17/2010 13:17) Added new observation of PRIMARY MD: Benedetto Goad MD (08/17/2010 13:17)       Past History:  Past Medical History: hiatal herniawe will Hypertension treated with meds changing over time Hyperlipidemia.Marland Kitchen we have never been successful starting a statin COPD Mild anxiety GERD Chronic headaches Prostate cancer treated with TUR and an implant CAD....DES proximal circumflex.. January, 2009 CABG 2002 Dr. Dorris Fetch Drug-eluting stent to 95% proximal circumflex January 2009 (grafts patent at that time) Ejection fraction 60%...catheterization... 2009  ( no echo data as of August 17, 2010) Carotid stenosis 60-79% LICA.  /  Doppler July, 2010.Marland Kitchen 0-39% RICA... 60-79% LICA /   Doppler.. October 27, 2009...Marland KitchenMarland KitchenMarland Kitchen stable 0-39% R. ICA, 60-79% LICA... followup 6 months /  0-39% RICA, 60-79%LICA  Doppler... April 28, 2010 Rash in early 2009 etiology not clear.. not from Plavix.. Claritin helped

## 2010-10-18 NOTE — Assessment & Plan Note (Signed)
Summary: 6 month rov/sl   Visit Type:  Follow-up Primary Provider:  Benedetto Goad MD  CC:  CAD.  History of Present Illness: The patient is seen for cardiology followup.  He is doing well.  He exercises.  Is not having chest pain or shortness of breath.  He also takes full care of his wife who is housebound.  Current Medications (verified): 1)  Plavix 75 Mg Tabs (Clopidogrel Bisulfate) .... Take 1 Tablet By Mouth Once A Day 2)  Triamterene-Hctz 37.5-25 Mg Tabs (Triamterene-Hctz) .... Take 1 Tablet By Mouth Once A Day 3)  Calcium 500/vitamin D 500-125 Mg-Unit Tabs (Calcium Carbonate-Vitamin D) .... Take One Tablet By Mouth Once Daily. 4)  Toprol Xl 50 Mg Xr24h-Tab (Metoprolol Succinate) .... 1/2 Tab By Mouth Once Daily 5)  Aspirin 81 Mg Tbec (Aspirin) .... Take One Tablet By Mouth Daily 6)  Vitamin C 500 Mg  Tabs (Ascorbic Acid) .... Take One Tablet By Mouth Once Daily. 7)  Vitamin D 1000 Unit  Tabs (Cholecalciferol) .... Take One Tablet By Mouth Once Daily. 8)  Fish Oil   Oil (Fish Oil) .... 2 Tabs Two Times A Day 9)  Prilosec Otc 20 Mg Tbec (Omeprazole Magnesium) .... As Needed  Allergies (verified): 1)  ! Aciphex 2)  ! * Has Allergies To Other Meds, But Does Not Know The Names.  Past History:  Past Medical History: Reviewed history from 02/17/2010 and no changes required. hiatal herniawe will Hypertension treated with meds changing over time Hyperlipidemia.Marland Kitchen we have never been successful starting a statin COPD Mild anxiety GERD Chronic headaches Prostate cancer treated with TUR and an implant CAD....DES proximal circumflex.. January, 2009 CABG 2002 Dr. Dorris Fetch Drug-eluting stent to 95% proximal circumflex January 2009 (grafts patent at that time) Ejection fraction 60%...catheterization... 2009 Carotid stenosis 60-79% LICA.  /  Doppler July, 2010.Marland Kitchen 0-39% RICA... 60-79% LICA /   Doppler.. October 27, 2009...Marland KitchenMarland KitchenMarland Kitchen stable 0-39% R. ICA, 60-79% LICA... followup 6 months Rash  in early 2009 etiology not clear.. not from Plavix.. Claritin helped  Review of Systems       Patient denies fever, chills, headache, sweats, rash, change in vision, change in hearing, chest pain, cough, nausea vomiting, urinary symptoms.  All of the systems are reviewed and are negative.  Vital Signs:  Patient profile:   77 year old male Height:      69 inches Weight:      192 pounds BMI:     28.46 Pulse rate:   64 / minute BP sitting:   130 / 60  (left arm)  Vitals Entered By: Burnett Kanaris, CNA (February 18, 2010 3:41 PM)  Physical Exam  General:  patient is stable. Eyes:  no xanthelasma. Neck:  no jugular venous distention. Lungs:  lungs are clear respiratory effort is nonlabored. Heart:  cardiac exam reveals S1 and S2.  No clicks or significant murmurs. Abdomen:  abdomen is soft. Extremities:  no peripheral edema. Psych:  patient is oriented to person time and place.  Affect is normal   Impression & Recommendations:  Problem # 1:  * STRESS OF CARING FOR HIS WIFE. this tissue continues but the patient continues to move forward.  Problem # 2:  CAROTID ARTERY STENOSIS (ICD-433.10)  His updated medication list for this problem includes:    Plavix 75 Mg Tabs (Clopidogrel bisulfate) .Marland Kitchen... Take 1 tablet by mouth once a day    Aspirin 81 Mg Tbec (Aspirin) .Marland Kitchen... Take one tablet by mouth daily The patient's  carotids are followed very carefully.  His last study was done October 27, 2009.  He has a 60-70% LICA.  There is plan for follow up in 6 months.  Problem # 3:  CAD (ICD-414.00)  His updated medication list for this problem includes:    Plavix 75 Mg Tabs (Clopidogrel bisulfate) .Marland Kitchen... Take 1 tablet by mouth once a day    Toprol Xl 50 Mg Xr24h-tab (Metoprolol succinate) .Marland Kitchen... 1/2 tab by mouth once daily    Aspirin 81 Mg Tbec (Aspirin) .Marland Kitchen... Take one tablet by mouth daily Coronary disease is stable.  Is not having any significant symptoms and no tests are needed.  We'll see him  back 6 months.  Patient Instructions: 1)  Your physician recommends that you schedule a follow-up appointment in: 6 months

## 2010-10-18 NOTE — Miscellaneous (Signed)
  Clinical Lists Changes  Observations: Added new observation of PAST MED HX: hiatal herniawe will Hypertension treated with meds changing over time Hyperlipidemia.Marland Kitchen we have never been successful starting a statin COPD Mild anxiety GERD Chronic headaches Prostate cancer treated with TUR and an implant CAD....DES proximal circumflex.. January, 2009 CABG 2002 Dr. Dorris Fetch Drug-eluting stent to 95% proximal circumflex January 2009 (grafts patent at that time) Ejection fraction 60%...catheterization... 2009 Carotid stenosis 60-79% LICA.  /  Doppler July, 2010.Marland Kitchen 0-39% RICA... 60-79% LICA /   Doppler.. October 27, 2009...Marland KitchenMarland KitchenMarland Kitchen stable 0-39% R. ICA, 60-79% LICA... followup 6 months Rash in early 2009 etiology not clear.. not from Plavix.. Claritin helped (02/17/2010 16:24) Added new observation of PRIMARY MD: Benedetto Goad MD (02/17/2010 16:24)       Past History:  Past Medical History: hiatal herniawe will Hypertension treated with meds changing over time Hyperlipidemia.Marland Kitchen we have never been successful starting a statin COPD Mild anxiety GERD Chronic headaches Prostate cancer treated with TUR and an implant CAD....DES proximal circumflex.. January, 2009 CABG 2002 Dr. Dorris Fetch Drug-eluting stent to 95% proximal circumflex January 2009 (grafts patent at that time) Ejection fraction 60%...catheterization... 2009 Carotid stenosis 60-79% LICA.  /  Doppler July, 2010.Marland Kitchen 0-39% RICA... 60-79% LICA /   Doppler.. October 27, 2009...Marland KitchenMarland KitchenMarland Kitchen stable 0-39% R. ICA, 60-79% LICA... followup 6 months Rash in early 2009 etiology not clear.. not from Plavix.. Claritin helped

## 2010-10-20 NOTE — Progress Notes (Signed)
Summary: ncu pre-procedure  Phone Note Outgoing Call   Call placed by: Harlow Asa CNMT Call placed to: Patient Reason for Call: Confirm/change Appt Summary of Call: Reviewed information on [employer] Information Sheet (see scanned document for further details).  Spoke with patient's wife.      Nuclear Med Background Indications for Stress Test: Evaluation for Ischemia, Graft Patency, Stent Patency   History: CABG, COPD, Heart Catheterization, Stents  History Comments: '02 CABG ;'09 Cath/Stents- 95% CFX  Symptoms: Chest Pain    Nuclear Pre-Procedure Cardiac Risk Factors: Carotid Disease, Hypertension Height (in): 69 Tech Comments: 7/10  0-39% RICA / 60-79% LICA

## 2010-10-20 NOTE — Assessment & Plan Note (Signed)
Summary: [id number]   Visit Type:  Follow-up Primary Provider:  Benedetto Goad MD  CC:  CAD.  History of Present Illness: The patient is seen for followup coronary artery disease.  I saw him on August 18, 2010.  He did have some vague chest discomfort.  He has known coronary disease.  Decision was made to proceed with a stress nuclear scan.  This was done and he had no ischemia.  Wall motion was normal with a normal ejection fraction.  Today he is feeling well.  He does have some mild GERD symptoms.  Current Medications (verified): 1)  Plavix 75 Mg Tabs (Clopidogrel Bisulfate) .... Take 1 Tablet By Mouth Once A Day 2)  Triamterene-Hctz 37.5-25 Mg Tabs (Triamterene-Hctz) .... Take 1 Tablet By Mouth Once A Day 3)  Calcium 500/vitamin D 500-125 Mg-Unit Tabs (Calcium Carbonate-Vitamin D) .... Take One Tablet By Mouth Once Daily. 4)  Toprol Xl 50 Mg Xr24h-Tab (Metoprolol Succinate) .... 1/2 Tab By Mouth Once Daily 5)  Aspirin 81 Mg Tbec (Aspirin) .... Take One Tablet By Mouth Daily 6)  Vitamin C 500 Mg  Tabs (Ascorbic Acid) .... Take One Tablet By Mouth Once Daily. 7)  Vitamin D 1000 Unit  Tabs (Cholecalciferol) .... Take One Tablet By Mouth Once Daily. 8)  Fish Oil   Oil (Fish Oil) .... 2 Tabs Two Times A Day 9)  Prevacid 30 Mg Cpdr (Lansoprazole) .... Take 1 Tablet By Mouth Once A Day As Needed 10)  Vitamin B-12 1000 Mcg Tabs (Cyanocobalamin) .... Once Daily  Allergies (verified): 1)  ! Aciphex 2)  ! * Has Allergies To Other Meds, But Does Not Know The Names.  Past History:  Past Medical History: hiatal herniawe will Hypertension treated with meds changing over time Hyperlipidemia.Marland Kitchen we have never been successful starting a statin COPD Mild anxiety.Marland Kitchen GERD Chronic headaches Prostate cancer treated with TUR and an implant.. CAD....DES proximal circumflex.. January, 2009  /   nuclear..08/30/2010...no ischemia..EF 70% CABG 2002 Dr. Dorris Fetch Drug-eluting stent to 95% proximal circumflex  January 2009 (grafts patent at that time) Ejection fraction 60%...catheterization... 2009  ( no echo data as of August 17, 2010) Carotid stenosis 60-79% LICA.  /  Doppler July, 2010.Marland Kitchen 0-39% RICA... 60-79% LICA /   Doppler.. October 27, 2009...Marland KitchenMarland KitchenMarland Kitchen stable 0-39% R. ICA, 60-79% LICA... followup 6 months /  0-39% RICA, 60-79%LICA  Doppler... April 28, 2010 Rash in early 2009 etiology not clear.. not from Plavix.. Claritin helped EF  70%...nuclear...08/2010  Review of Systems       Patient denies fever, chills, headache, sweats, rash, change in vision, change in hearing chest pain, cough, nausea vomiting, urinary symptoms.  All other systems are reviewed and are negative.  Vital Signs:  Patient profile:   77 year old male Height:      69.5 inches Weight:      190 pounds BMI:     27.76 Pulse rate:   75 / minute BP sitting:   130 / 68  (left arm) Cuff size:   regular  Vitals Entered By: Hardin Negus, RMA (September 22, 2010 2:45 PM)  Physical Exam  General:  he is stable today. Eyes:  no xanthelasma. Neck:  no jugular venous distention. Lungs:  lungs are clear.  Respiratory is not labored. Heart:  cardiac exam reveals S1-S2 no clicks or significant murmurs. Abdomen:  abdomen is soft. Extremities:  no peripheral edema. Psych:  patient is oriented to person time and place her affect is normal.  Impression & Recommendations:  Problem # 1:  * STRESS OF CARING FOR HIS WIFE. This issue continues but he feels with it well.  Problem # 2:  CAROTID ARTERY STENOSIS (ICD-433.10)  His updated medication list for this problem includes:    Plavix 75 Mg Tabs (Clopidogrel bisulfate) .Marland Kitchen... Take 1 tablet by mouth once a day    Aspirin 81 Mg Tbec (Aspirin) .Marland Kitchen... Take one tablet by mouth daily His carotids are followed carefully.  Problem # 3:  CAD (ICD-414.00)  His updated medication list for this problem includes:    Plavix 75 Mg Tabs (Clopidogrel bisulfate) .Marland Kitchen... Take 1 tablet by mouth  once a day    Toprol Xl 50 Mg Xr24h-tab (Metoprolol succinate) .Marland Kitchen... 1/2 tab by mouth once daily    Aspirin 81 Mg Tbec (Aspirin) .Marland Kitchen... Take one tablet by mouth daily Coronary disease is stable.  His nuclear exercise study showed no ischemia and normal LV function.  No further workup.  I will see him back in one year for followup.  Patient Instructions: 1)  Your physician wants you to follow-up in:  1 year.  You will receive a reminder letter in the mail two months in advance. If you don't receive a letter, please call our office to schedule the follow-up appointment.

## 2010-10-20 NOTE — Miscellaneous (Signed)
  Clinical Lists Changes  Observations: Added new observation of PAST MED HX: hiatal herniawe will Hypertension treated with meds changing over time Hyperlipidemia.William Weber we have never been successful starting a statin COPD Mild anxiety GERD Chronic headaches Prostate cancer treated with TUR and an implant.. CAD....DES proximal circumflex.. January, 2009  /   nuclear..08/30/2010...no ischemia..EF 70% CABG 2002 Dr. Dorris Fetch Drug-eluting stent to 95% proximal circumflex January 2009 (grafts patent at that time) Ejection fraction 60%...catheterization... 2009  ( no echo data as of August 17, 2010) Carotid stenosis 60-79% LICA.  /  Doppler July, 2010.William Weber 0-39% RICA... 60-79% LICA /   Doppler.. October 27, 2009...William KitchenMarland KitchenMarland Weber stable 0-39% R. ICA, 60-79% LICA... followup 6 months /  0-39% RICA, 60-79%LICA  Doppler... April 28, 2010 Rash in early 2009 etiology not clear.. not from Plavix.. Claritin helped EF  70%...nuclear...08/2010 (09/21/2010 19:27) Added new observation of PRIMARY MD: Benedetto Goad MD (09/21/2010 19:27)       Past History:  Past Medical History: hiatal herniawe will Hypertension treated with meds changing over time Hyperlipidemia.William Weber we have never been successful starting a statin COPD Mild anxiety GERD Chronic headaches Prostate cancer treated with TUR and an implant.. CAD....DES proximal circumflex.. January, 2009  /   nuclear..08/30/2010...no ischemia..EF 70% CABG 2002 Dr. Dorris Fetch Drug-eluting stent to 95% proximal circumflex January 2009 (grafts patent at that time) Ejection fraction 60%...catheterization... 2009  ( no echo data as of August 17, 2010) Carotid stenosis 60-79% LICA.  /  Doppler July, 2010.William Weber 0-39% RICA... 60-79% LICA /   Doppler.. October 27, 2009...William KitchenMarland KitchenMarland Weber stable 0-39% R. ICA, 60-79% LICA... followup 6 months /  0-39% RICA, 60-79%LICA  Doppler... April 28, 2010 Rash in early 2009 etiology not clear.. not from Plavix.. Claritin helped EF   70%...nuclear...08/2010

## 2010-10-20 NOTE — Assessment & Plan Note (Signed)
Summary: Cardiology Nuclear Testing  Nuclear Med Background Indications for Stress Test: Evaluation for Ischemia, Graft Patency, Stent Patency   History: CABG, COPD, Heart Catheterization, Stents  History Comments: '02 CABG ;'09 Cath/Stents- 95% CFX  Symptoms: Chest Pain, DOE, Palpitations, SOB    Nuclear Pre-Procedure Cardiac Risk Factors: Carotid Disease, Hypertension Caffeine/Decaff Intake: None NPO After: 7:00 PM Lungs: clear IV 0.9% NS with Angio Cath: 22g     IV Site: R Wrist IV Started by: Irean Hong, RN Chest Size (in) 42     Height (in): 69.5 Weight (lb): 188 BMI: 27.46 Tech Comments: Held toprol x 36 hrs.  Nuclear Med Study 1 or 2 day study:  1 day     Stress Test Type:  Stress Reading MD:  Olga Millers, MD     Referring MD:  J.Katz Resting Radionuclide:  Technetium 66m Tetrofosmin     Resting Radionuclide Dose:  11.0 mCi  Stress Radionuclide:  Technetium 73m Tetrofosmin     Stress Radionuclide Dose:  33.0 mCi   Stress Protocol Exercise Time (min):  7:31 min     Max HR:  132 bpm     Predicted Max HR:  142 bpm  Max Systolic BP: 206 mm Hg     Percent Max HR:  92.96 %     METS: 9.3 Rate Pressure Product:  13086    Stress Test Technologist:  Milana Na, EMT-P     Nuclear Technologist:  Doyne Keel, CNMT  Rest Procedure  Myocardial perfusion imaging was performed at rest 45 minutes following the intravenous administration of Technetium 52m Tetrofosmin.  Stress Procedure  The patient exercised for 7:31. The patient stopped due to fatigue and denied any chest pain.  There were non specific ST-T wave changes occ pacs/pvcs/pat.  Technetium 31m Tetrofosmin was injected at peak exercise and myocardial perfusion imaging was performed after a brief delay.  QPS Raw Data Images:  Acquisition technically good; normal left ventricular size. Stress Images:  Normal homogeneous uptake in all areas of the myocardium. Rest Images:  Normal homogeneous uptake in all  areas of the myocardium. Subtraction (SDS):  No evidence of ischemia. Transient Ischemic Dilatation:  0.96  (Normal <1.22)  Lung/Heart Ratio:  0.32  (Normal <0.45)  Quantitative Gated Spect Images QGS EDV:  80 ml QGS ESV:  22 ml QGS EF:  73 % QGS cine images:  Normal wall motion.   Overall Impression  Exercise Capacity: Fair exercise capacity. BP Response: Normal blood pressure response. Clinical Symptoms: No chest pain ECG Impression: No significant ST segment change suggestive of ischemia. Overall Impression: Normal stress nuclear study with no ischemia or infarction.  Appended Document: Cardiology Nuclear Testing Good  Appended Document: Cardiology Nuclear Testing pt's wife aware

## 2010-10-20 NOTE — Miscellaneous (Signed)
Summary: Rehab Report  Rehab Report   Imported By: Omer Jack 09/22/2010 16:33:28  _____________________________________________________________________  External Attachment:    Type:   Image     Comment:   External Document

## 2010-11-16 ENCOUNTER — Encounter: Payer: Self-pay | Admitting: Cardiology

## 2010-11-17 ENCOUNTER — Encounter (INDEPENDENT_AMBULATORY_CARE_PROVIDER_SITE_OTHER): Payer: Medicare Other

## 2010-11-17 ENCOUNTER — Encounter: Payer: Self-pay | Admitting: Cardiology

## 2010-11-17 DIAGNOSIS — I6529 Occlusion and stenosis of unspecified carotid artery: Secondary | ICD-10-CM

## 2010-11-24 NOTE — Miscellaneous (Signed)
Summary: Orders Update  Clinical Lists Changes  Orders: Added new Test order of Carotid Duplex (Carotid Duplex) - Signed 

## 2011-01-31 NOTE — Assessment & Plan Note (Signed)
[facility]                            CARDIOLOGY OFFICE NOTE   William Weber, William Weber                 MRN:          [id number]  DATE:12/30/2007                            DOB:          [birth year]    William Weber is doing well.  I saw him last on December 04, 2007.  Fortunately, we did not have to stop his Plavix.  Since then, he has had  the return of some mild intermittent itching.  I made it clear to him  that Claritin on a regular basis would be quite safe for him.  He has  not had any major change in his headaches.  Unfortunately, his blood  pressure remains elevated.  He takes his pressure at home, and usually  the systolic is somewhere mildly above 150.  Today it is 165/77.   PAST MEDICAL HISTORY:   ALLERGIES:  No known drug allergies.   MEDICATIONS:  1 . Plavix.  1. Aspirin.  2. Fish oil.  3. Toprol 25.  4. Lisinopril 20 b.i.d.   OTHER MEDICAL PROBLEMS:  See the list on the note of November 19, 2007.   REVIEW OF SYSTEMS:  Other than the HPI, his Review of Systems is  negative.   PHYSICAL EXAMINATION:  VITAL SIGNS:  Blood pressure is 165/77 with a  pulse of 58.  GENERAL:  The patient is oriented to person, time and place.  Affect is  normal.  HEENT:  Reveals no xanthelasma.  He has normal extraocular motion.  NECK:  There are no carotid bruits.  There is no jugular venous  distention.  LUNGS:  Clear.  Respiratory effort is not labored.  CARDIAC:  Exam reveals S1 with an S2.  There are no clicks or  significant murmurs.  ABDOMEN:  Soft.  EXTREMITIES:  He is no peripheral edema.   Problems are listed on the note of November 19, 2007.  #2.  Hypertension.  I believe that amlodipine would be a great drug for  him, but we are worried about headaches.  We will try  hydrochlorothiazide in a small dose, and I will see him back for  followup.    [name], MD, [facility]  Electronically Signed   JDK/MedQ  DD: 12/30/2007  DT: 12/30/2007   Job #: [id number]   cc:   [name], [name]

## 2011-01-31 NOTE — Assessment & Plan Note (Signed)
[facility]                            CARDIOLOGY OFFICE NOTE   ISAO, SELTZER                 MRN:          [id number]  DATE:12/04/2007                            DOB:          1929/09/25    William Weber is seen in followup.  See my note of November 19, 2007.  At  that time, he had a rash and we were quite concerned about it.  We had  him take Claritin for a few days and he was going to switch to Ticlid if  needed.  His rash disappeared and he felt better and he never had to  take Ticlid.  He is having a mild headache on a more frequent basis than  usual.  He is concerned that it might be from his Plavix, but he is  stable.  He is doing well otherwise.   PAST MEDICAL HISTORY:   ALLERGIES:  NO KNOWN DRUG ALLERGIES.   MEDICATIONS:  1. Plavix.  2. Aspirin.  3. Lisinopril 20 (to be increased to 20 b.i.d.).  4. Calcium.  5. Flaxseed oil.  6. Fish oil.  7. Toprol.   OTHER MEDICAL PROBLEMS:  See the list on my note of November 19, 2007.   REVIEW OF SYSTEMS:  He is feeling well other than the HPI.   PHYSICAL EXAM:  Blood pressure is 140/77 with a pulse of 56.  He brought  in blood pressures from home and his systolic tends to run higher.  We  will increase his medications.  HEENT:  Reveals no xanthelasma.  He has normal extraocular motion.  There are no carotid bruits.  There is no jugular venous distention.  LUNGS:  Clear.  Respiratory effort is not labored.  CARDIAC:  Exam reveals an S1 with an S2.  There are no clicks or  significant murmurs.  The abdomen is soft.  There is no peripheral  edema.   William Weber is stable.  We will increase his lisinopril dose to 20  b.i.d.  I considered adding Norvasc, but I did not want to cause any  worsening headaches.  I will see him for followup.     William Abed, MD, [facility]  Electronically Signed    JDK/MedQ  DD: 12/04/2007  DT: 12/05/2007  Job #: [id number]

## 2011-01-31 NOTE — Assessment & Plan Note (Signed)
[facility]                            CARDIOLOGY OFFICE NOTE   BRYSON, William                 MRN:          [id number]  DATE:09/25/2007                            DOB:          09/03/1930    This is a patient of Dr. Willa Rough.  [name] is a 77 year old  married white male patient of Dr. Henrietta Hoover who has a history of coronary  artery disease status post CABG in 2002.  He has done very well since  then without any significant problems.  He comes in today because he got  up to go to bathroom Saturday evening and noticed a discomfort in his  left chest and numbness in his left arm.  This went away on its own but  when he told Dr. Andrey Campanile about it on Monday he made an appointment to be  seen here.  In talking in detail with this patient, he says he has had  numbness in his left chest since the surgery and felt it was due to  nerve being cut.  He can reproduce the pain in his arm and tingling by  squeezing on his elbow.  He walks a half an hour on the treadmill 3  times a week, lifts weights without any symptoms.  In the same breath,  he says he can walk doing his regular activity and feel some numbness in  his left chest.  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  He also has elevated blood pressure in the office  today 180/90, although he has not taken his medications before coming  in.  He also does not take any of lipid lowering agents because he has  had side effects from all of them.   The patient also has had increased symptoms from his hiatal hernia.  He  says he has quite a lot of belching and reflux of bile-like fluid after  he eats.  He recently changed himself from Prilosec to Aciphex.   CURRENT MEDICATIONS:  1. Metoprolol 50 mg 1/2 daily.  2. Fish oil 1200 mg daily.  3. Aspirin 81 mg daily.  4. Vesicare 10 mg daily.  5. Essential vitamins daily.  6. Calcium plus D 500 mg daily.  7. Flax seed oil daily.  8. Aciphex 20 mg daily.   PHYSICAL EXAM:  This is a pleasant 77 year old white male in no acute  distress.  Blood pressure 180/90, pulse 63, weight 197.  Neck is without JVD, HJR, question of a right carotid bruit.  LUNGS:  Clear anterior posterior and lateral.  HEART:  Regular rate and rhythm at 63 beats per minute.  Normal S1-S2,  positive S4 no murmur, rub, bruits, thrill or heave noted.  Abdomen is soft organomegaly, masses, lesions or abnormal tenderness.  EXTREMITIES:  Without cyanosis, clubbing, or edema.  He has good distal  pulses.   IMPRESSION:  1. Chest pain with some typical and atypical features.  2. Coronary artery disease status post CABG in 2002.  LIMA to the LAD,  SVG to the diagonal, SVG to the PDA and PLA, and SVG to the OM1.    1. Hypertension uncontrolled.  2. Hypercholesterolemia.  The patient cannot tolerate Statins.  3. Question of chronic obstructive pulmonary disease.  4. Prostate cancer as well as a bladder tumor followed by Dr. Dayton Scrape      and Dr. Logan Bores.  5. History of anxiety.  6. History of headaches stable.  7. History of gastroesophageal reflux disease and hiatal hernia acting      up.   PLAN:  At this time I had a long discussion with Mr. Adkison about  options.  One of which was proceeding with cardiac catheterization  versus stress Myoview.  His wife is quite disabled and he is the primary  care giver, and would like to hold off on cath if at all possible.  Some  of his symptoms are quite atypical so I feel we can proceed safely with  a stress Myoview to further evaluate his chest pain.  I have also given  a prescription for lisinopril 10 mg daily in hopes of better blood  pressure control, and he will need carotid Dopplers.  He will follow up  with Dr. Myrtis Ser after these tests are performed.      Jacolyn Reedy, PA-C  Electronically  Signed      Veverly Fells. Excell Seltzer, MD  Electronically Signed   ML/MedQ  DD: 09/25/2007  DT: 09/25/2007  Job #: [id number]   cc:   [name], [name]  Jamison Neighbor, [name]  Maryln Gottron, [name]

## 2011-01-31 NOTE — Assessment & Plan Note (Signed)
[facility]                            CARDIOLOGY OFFICE NOTE   William Weber, William Weber                 MRN:          [id number]  DATE:07/30/2008                            DOB:          [birth year]    William Weber is doing well.  I followed him for hypertension and  coronary artery disease.  He also has carotid disease.  His coronary  artery disease is stable.  He had a procedure in January 2009.  He has  been stable since then.  He had a drug-eluting stent to the proximal  circumflex.  I told him I would like for him to be on Plavix for  approximately 2 years.  He is not having any chest pain or shortness of  breath.   PAST MEDICAL HISTORY:   ALLERGIES:  No known drug allergies.   MEDICATIONS:  See the flow sheet.   REVIEW OF SYSTEMS:  He has no GI or GU symptoms.  He is not having any  fevers or chills.  He has no headaches.  There are no skin rashes.  His  review of systems otherwise is negative.   PHYSICAL EXAMINATION:  VITAL SIGNS:  Blood pressure is 136/74 with a  pulse of 61.  GENERAL:  The patient is oriented to person, time, and place.  Affect is  normal.  HEENT:  No xanthelasma.  He has normal extraocular motion.  NECK:  There are no carotid bruits.  There is no jugular venous  distention.  LUNGS:  Clear.  Respiratory effort is not labored.  CARDIAC:  S1 with an S2.  There are no clicks or significant murmurs.  ABDOMEN:  Soft.  EXTREMITIES:  He has no peripheral edema.   EKG shows old diffuse ST-T wave changes, but no change.   Problems are listed on the note of November 19, 2007.  #2.  Hypertension controlled.  #3.  Hyperlipidemia.  Unfortunately, we have never been able to get him  on a statin.  #10.  Status post intervention in January 2009 and he is quite stable,  and he will continue Plavix.  #11.  Carotid disease.  He had followup carotid Doppler on April 14, 2008, and he has disease of significance on the left that is  being  followed with a 76-month followup.   William Weber is stable.  No change in his meds.  I will see him back  in 6 months.     [name], MD, [facility]  Electronically Signed    JDK/MedQ  DD: 07/30/2008  DT: 07/31/2008  Job #: [id number]   cc:   [name], [name]

## 2011-01-31 NOTE — Cardiovascular Report (Signed)
NAME:  LINCOLN, GINLEY NO.:  [id number]   MEDICAL RECORD NO.:  [id number]          PATIENT TYPE:  INP   LOCATION:  2807                         FACILITY:  [facility]   PHYSICIAN:  Everardo Beals. Juanda Chance, MD, FACCDATE OF BIRTH:  December 04, 1929   DATE OF PROCEDURE:  10/01/2007  DATE OF DISCHARGE:                            CARDIAC CATHETERIZATION   CLINICAL HISTORY:  Mr. Dull is 77 years old and in 2002 had bypass  surgery by Dr. Dorris Fetch.  He has done well since that time but over  the last several weeks he has developed exertional chest pain and today  his pain got worse and he came to the emergency room where his pain was  unrelieved with nitroglycerin.  He was seen by Dr. Gala Romney and  scheduled for evaluation with angiography.  He is currently retired but  previously held 2 jobs Pharmacist, community.   PROCEDURE:  The procedure was performed via the right femoral artery  using an arterial sheath and 5-French preformed coronary catheters.  A  front wall arterial puncture was performed and Omnipaque contrast was  used.  JR-4 catheter was used for injection of the LIMA graft.  At  completion of the diagnostic study we made a decision to do intervention  on the native circumflex artery which was a complex lesion.  This lesion  was new from the preoperative study.   The patient was given Angiomax bolus and infusion and was given 4  chewable aspirin and was given 600 mg of Plavix and 20 mg of Pepcid.  We  chose a CLS-4 6-French guiding catheter with side holes.  The lesion was  quite complex with a sharp bend in the circumflex artery and then a  complex long lesion that was extremely tight.  We were finally able to  navigate across the lesion with a PT2 light support wire.  We used a  long wire and over-the-wire balloon.  We then passed a 2.0 x 20-mm  Maverick to the lesion and expanded this with 2 inflations up to 12  atmospheres for 20 seconds.  It took 12 atmospheres  to eliminate the  weight in the balloon.  We then deployed a 2.5 x 18 mm Promus stent.  There was a sharper than 90 degree bend in the circumflex artery and it  was very difficult to get the Promus stent to the lesion, but we were  finally able to accomplish this by deep seating the guiding catheter.  We positioned the stent and deployed it with 1 inflation of 12  atmospheres for 30 seconds.  We then postdilated with a 2.75 x 8 mm  Quantum Maverick, performing 3 inflations to 16 atmospheres for 20  seconds.  Final diagnostics were then performed through the guiding  catheter.  The right femoral artery was closed with Angio-Seal at the  end of the procedure.  The patient tolerated the procedure well and left  the laboratory in satisfactory condition.   RESULTS:  Left main coronary artery:  The left main coronary artery was  free of significant disease.   Left anterior descending artery:  The left anterior descending  artery  gave rise to 2 diagonal branches and then there was competing flow  distally.  There was a 50% narrowing in the proximal LAD and there was  an 80% ostial narrowing in the first diagonal branch.   Circumflex artery:  The circumflex artery gave rise to a ramus branch, a  marginal branch and 2 posterolateral branches.  There was 80% stenosis  in the ramus branch.  The marginal branch was completely occluded.  There was a complex lesion in the proximal circumflex artery which  extended over about 15 mm right after a sharp bend in the circumflex  artery.  There was also an 80% lesion at the ostium of the first of the  posterolateral branches.   Right coronary artery:  The right coronary artery was a moderate-sized  vessel and gave rise to a right ventricular branch, a posterior  descending branch and a posterolateral branch.  There was a 50% ostial  stenosis and a long 50% stenosis in the midportion of the vessel.  There  was a 90% stenosis in the  posterolateral branch  and an 80% stenosis in  the posterolateral branch.  There was competing flow in the  posterolateral branch.   The saphenous vein graft to the posterior descending branch of the right  coronary was patent and functioned normally.  The previous report said  there was a graft to the posterior descending branch as well but we were  not able to identify that there was a connection on our angiogram today.   The saphenous vein graft to the marginal branch of the circumflex artery  was patent with a 20% narrowing in the proximal portion.   The saphenous vein graft to the diagonal branch of the LAD was patent  with luminal irregularities.   The LIMA graft to the LAD was patent and functioned normally.   Left ventriculogram:  Left ventriculogram performed in the RAO  projection showed good wall motion with no areas of hypokinesis.  The  estimated ejection fraction was 60%.   Following stenting of the lesion in the circumflex artery, stenosis  improved from 95% to 0%.   CONCLUSION:  1. Coronary artery disease status post prior coronary bypass graft      surgery 2002.  2. Severe native vessel disease with 50% stenosis in the proximal LAD,      80% ostial stenosis in the first large diagonal branch, 80%      stenosis in a small ramus branch of the circumflex artery, total      occlusion of the marginal branch of the circumflex artery, 95%      stenosis in the proximal circumflex artery, a 50% ostial and 50%      mid stenosis in the right coronary artery with 90 and 80% stenosis      in the large posterolateral branch.  3. Patent vein graft to the posterolateral branch of the right      coronary artery, patent vein graft to the marginal branch of the      circumflex artery with 20% proximal stenosis, patent vein graft to      the diagonal branch of the LAD, and patent LIMA graft to the LAD.  4. Normal left ventricular function with estimated ejection fraction      of 60%.  5. Successful  percutaneous coronary intervention of the lesion in the      proximal native circumflex artery using a Promus drug-eluting stent      with  improvement of the __________  narrowing from 95% to 0%.   DISPOSITION:  The patient was returned __________  for further  observation.  He should remain on Plavix for at least a year.  Will plan  to discharge him tomorrow if he remains stable.      Bruce Elvera Lennox Juanda Chance, MD, [facility]  Electronically Signed     BRB/MEDQ  D:  10/01/2007  T:  10/02/2007  Job:  [id number]   cc:   [name], MD, [facility]  [name], [name]

## 2011-01-31 NOTE — Assessment & Plan Note (Signed)
[facility]                            CARDIOLOGY OFFICE NOTE   William, [name]                 MRN:          [id number]  DATE:07/02/2007                            DOB:          05-May-1930    Mr. William Weber is doing very well.  He is very active physically.  He is  not having chest pain.  He has had no syncope or presyncope.  There has  been no shortness of breath.  He has coronary disease post CABG in 2005.  He has been treated for his bladder and prostate tumors, and he has done  very well.  He is followed by Dr. Marcelyn Bruins and Dr. Dayton Scrape in the  cancer center.   Today in the office, he is doing well.   PAST MEDICAL HISTORY:   ALLERGIES:  He has felt poorly and hurt all over with cholesterol  medicines.   MEDICATIONS:  Metoprolol, fish oil, aspirin, Vesicare, Prilosec,  vitamins, and he receives a shot every 4 months related to his prostate  cancer.  He has had prostate radiation and seed implants.   OTHER MEDICAL PROBLEMS.:  See the list below.   REVIEW OF SYSTEMS:  He has some hoarseness in his voice.  This is  intermittent.  He wonders if this is related to his taking Nexium which  he has now stopped.  Otherwise, Review of Systems is negative.   PHYSICAL EXAMINATION:  VITAL SIGNS:.  Weight is 192, blood pressure  130/80 with a pulse of 59.  GENERAL:  The patient is oriented to person, time and place.  Affect is  normal.  HEENT:  Reveals no xanthelasma.  He has normal extraocular motion.  NECK:  No carotid bruits, no jugular venous distention.  LUNGS:  Clear.  Respiratory effort is not labored.  CARDIAC:  Exam reveals S1 and S2.  There are no clicks or significant  murmurs.  ABDOMEN:  Soft.  He has no masses or bruits.  He has normal bowel  sounds.  EXTREMITIES:  There is no significant peripheral edema.  There are no  significant musculoskeletal deformities.   EKG reveals sinus bradycardia with nonspecific ST-T wave  changes.   PROBLEM LIST:  1. Coronary disease post coronary artery bypass grafting.  He is on a      beta blocker and aspirin.  2. Hypercholesterolemia.  I would very much like for him to be on a      statin.  He  absolutely will not do that as he says he felt      terrible with it.  He is taking some over-the-counter medications.      We talked about the pros and cons of check his cholesterol.  I      believe this will be helpful at this point, and then we can at      least re-discuss where we are.  3. Question of chronic obstructive pulmonary disease.  4. Prostate and bladder tumors which have been treated successfully      with Dr. Dayton Scrape and Dr. Logan Bores.  5. History of some anxiety that is quite stable.  6. Hypertension, treated.  7. Headaches, stable.  8. Intermittent roughness in his voice.  Etiology is not clear to      me.  No further workup from the cardiac viewpoint.   Mr. Brockbank is stable.  We will check a fasting lipid.  No further  cardiac evaluation at this time.  I will see him back in 1 year.     [name], MD, [facility]  Electronically Signed    JDK/MedQ  DD: 07/02/2007  DT: 07/03/2007  Job #: [id number]   cc:   [name], [name]  Jamison Neighbor, [name]  Maryln Gottron, [name]

## 2011-01-31 NOTE — Discharge Summary (Signed)
NAME:  GOPAL, MALTER NO.:  [id number]   MEDICAL RECORD NO.:  [id number]          PATIENT TYPE:  INP   LOCATION:  2918                         FACILITY:  [facility]   PHYSICIAN:  Everardo Beals. Juanda Chance, MD, FACCDATE OF BIRTH:  [birth year]   DATE OF ADMISSION:  10/01/2007  DATE OF DISCHARGE:  10/02/2007                               DISCHARGE SUMMARY   PRIMARY CARDIOLOGIST:  Dr. Willa Rough.   PRIMARY CARE PROVIDERS:  Dr. Benedetto Goad and Dr. Marcelyn Bruins.   DISCHARGE DIAGNOSIS:  Unstable angina/coronary artery disease.   SECONDARY DIAGNOSES:  1. History of hiatal hernia.  2. Hypertension.  3. Hyperlipidemia.  4. Chronic obstructive pulmonary disease.  5. Anxiety.  6. Gastroesophageal reflux disease.  7. Chronic headaches.  8. Prostate carcinoma status post seed implant and transurethral      resection of the prostate.   ALLERGIES:  NO KNOWN DRUG ALLERGIES.   PROCEDURE:  Left heart cardiac catheterization with successful PCI and  stenting of the native left circumflex with placement of a 2.5 x 18 mm  Promus drug-eluting stent.   HISTORY OF PRESENT ILLNESS:  A 77 year old married Caucasian male with  prior history of CAD status post CABG in August 2002 who recently was  seen by Herma Carson, PA in our office on September 25, 2007 with  complaints of chest discomfort.  Catheterization was recommended,  however, the patient preferred a stress Myoview which was subsequently  scheduled.  He was also hypertensive at that visit and started on  lisinopril 10 mg daily.  Unfortunately he woke up on the evening of  January 12,2009 with chest discomfort with radiation to the left  shoulder and elbow which subsequently lingered when he went back to bed.  He then presented to the [facility] ED for further evaluation.  In the  ED ECG showed anterior ST-segment depression and he was admitted for  further evaluation and cardiac catheterization.   HOSPITAL COURSE:  The patient  underwent left heart rate catheterization  on October 01, 2007 revealing 4/4 patent grafts including the LIMA to  the LAD, vein graft to the diagonal, vein graft to the obtuse marginal  and vein graft to the posterolateral branch of the RCA.  EF was 60%.  He  did have new a 95% stenosis in the proximal left circumflex.  This was  successfully stented with a 2.5 x 18 mm Promus drug-eluting stent.  Post  procedure he was maintained on IV nitroglycerin secondary to elevated  blood pressure, however, with titration of ACE inhibitor therapy blood  pressure has improved and IV nitro was weaned off.  He has been  ambulating without difficulty or recurrent symptoms and will be  discharged home today in satisfactory condition.  Of note, the patient  has tried multiple statins in the past including as well as Zetia all of  which have caused myalgias and for that reason he will not initiated on  statin therapy.   DISCHARGE LABS:  Hemoglobin 12.8, hematocrit 36.6, WBC 6.7, platelets  160.  Sodium 140, potassium 3.8, chloride 104, CO2 28, BUN 7, creatinine  0.86, glucose 123,  CK 117, MB 2.2, troponin I 0.06, total cholesterol  202, triglycerides 123, HDL 34, LDL 143, INR 1.0.   DISPOSITION:  The patient is being discharged home today in good  condition.   FOLLOW-UP APPOINTMENTS:  We have arrange for follow-up with Dr. Willa Rough on October 15, 2007 at 11:00 a.m.  He has a carotid Doppler  scheduled for October 07, 2007 at 11:30 a.m. (These have been previously  scheduled).  He is asked to follow up with Dr. Benedetto Goad as scheduled.   DISCHARGE MEDICATIONS:  1. Aspirin 325 mg daily.  2. Plavix 75 mg daily.  3. Toprol XL 25 mg daily.  4. Lisinopril 20 mg daily.  5. Vesicare 10 mg daily.  6. Aciphex 20 mg daily.  7. Calcium 600 mg plus D daily.  8. Flaxedil 1 tablet daily.  9. Fish oil 2 tablets daily.  10.Nitroglycerin 0.4 mg sublingual p.r.n. chest pain.   OUTSTANDING LAB STUDIES:   None.   Duration of discharge encounter 60 minutes including physician time.      Nicolasa Ducking, ANP      Bruce R. Juanda Chance, MD, [facility]  Electronically Signed    CB/MEDQ  D:  10/02/2007  T:  10/02/2007  Job:  [id number]   cc:   [name], [name]

## 2011-01-31 NOTE — H&P (Signed)
NAME:  KHYREN, HING NO.:  [id number]   MEDICAL RECORD NO.:  [id number]          PATIENT TYPE:  INP   LOCATION:  1846                         FACILITY:  [facility]   PHYSICIAN:  Bevelyn Buckles. Bensimhon, MDDATE OF BIRTH:  Jul 31, 1930   DATE OF ADMISSION:  10/01/2007  DATE OF DISCHARGE:                              HISTORY & PHYSICAL   PRIMARY CARDIOLOGIST:  Dr. Willa Rough.   PRIMARY CARE PHYSICIAN:  Dr. Benedetto Goad.   HISTORY AND PHYSICAL:  This is a 77 year old Caucasian male with known  history of coronary artery disease status post coronary artery bypass  five vessel in 2002 with a history of prostate cancer and  hypercholesterolemia with complaints of chest discomfort.  The patient  actually saw Herma Carson, Physician Assistant, on September 25, 2007,  with complaints of chest pain.  At that time the patient was recommended  to have catheterization but refused but agreed to stress Myoview which  is scheduled later this week.  The patient was known to be hypertensive  at that time and was given lisinopril 10 mg to take secondary to this.   The patient woke up last night to go to the bathroom.  When he was  walking back to bed he began to some chest pain.  It felt like something  snapped and he felt heart pain radiating to his left shoulder and elbow.  It lasted a couple of minutes.  Went back to bed but could not sleep  secondary to lingering pain which comes and goes.  He went back to bed  after the pain went away.   He got up this morning and complained of intermittent chest discomforts  again this a.m., pressure.  He took his blood pressure medications  today.  Usually he is very active.  He goes to the gym three times a  week.  He lifts weights.  He does heavy yard work but has noticed that  he has been more tired lately and more short of breath lately.   The patient called his daughter who came by to see him concerning this  discomfort.  The patient's  daughter called [facility] and was  advised to come to the [facility].   On arrival at [facility] the patient was found to be hypertensive with a  blood pressure of 192/107, pulse 59, respirations 14, with a temperature  97.9.  O2 sat 99% on 2 liters.  The patient was given 4 baby aspirin and  started on nitroglycerin on my assessment and is being admitted to rule  out myocardial infarction with the possible need for catheterization.   REVIEW OF SYSTEMS:  Positive for chest pains, shortness of breath,  nausea, and generalized fatigue.  Otherwise negative.   PAST MEDICAL HISTORY:  1. Coronary artery disease.      a.     Status post 5-vessel coronary artery bypass grafting in 2002       with LIMA to LAD, SVG to first diagonal, SVG to OM, sequential SVG       to PDA and posterolateral in August of 2002.  2.  Hiatal hernia.  3. Hypertension.  Not well controlled.  4. Hypercholesterolemia.  5. Chronic obstructive pulmonary disease.  6. Anxiety.  7. History of chronic gastroesophageal reflux disease.  8. History of frequent headaches.   CURRENT MEDICATIONS:  1. Metoprolol 25 mg once daily.  2. Fish oil 1200 mg once daily.  3. Aspirin 81 mg once daily.  4. Vesicare 10 mg once daily.  5. Multivitamin once daily.  6. Calcium plus D 500 mg once daily.  7. Flaxseed oil once daily.  8. Aciphex 20 mg once daily.  9. Lisinopril 10 mg daily.   ALLERGIES:  No known drug allergies.   SOCIAL HISTORY:  The patient lives in [place] with his wife.  He  works part time, does a lot of physical labor doing the yard.  He is  retired Actuary. Company secretary.  He is married with 2 children.  Does not smoke,  does not drink alcohol.  No drug use.   FAMILY HISTORY:  Positive for coronary artery disease.   LABORATORY DATA:  Current labs are pending.  EKG revealing sinus with T-wave inversion noted laterally with mild  flattening noted in V3 as well and V1 unchanged since prior EKG in  June  2008.  Chest x-ray results are pending.   PHYSICAL EXAMINATION:  VITAL SIGNS:  Blood pressure is 186/88, pulse 59,  respirations 14, temperature 97.2.  HEENT:  Head is normocephalic, atraumatic.  Patient wears glasses.  NECK:  Supple.  There is jugular venous distention.  There are no  carotid bruits appreciated.  CARDIOVASCULAR:  Regular rate and rhythm.  Distant heart sounds without  murmurs, rubs, or gallops.  Pulses are 2+ and equal.  LUNGS:  Essentially clear to auscultation without wheezes, rales, or  rhonchi.  ABDOMEN:  Soft.  Non-tender.  2+ bowel sounds.  EXTREMITIES:  Without clubbing, cyanosis, and edema.  NEUROLOGIC:  Cranial nerves 2-12 are grossly intact.   IMPRESSION:  1. Unstable angina with mixed component.  2. Severe hypertension, not well controlled on Toprol and Lisinopril.  3. Known coronary artery disease.  Status post 5-vessel coronary      artery bypass grafting.  4. Hypercholesterolemia.  5. Anxiety.  6. Gastroesophageal reflux disease.   PLAN:  The patient has been seen and examined by myself and Dr. Harolyn Rutherford.  The patient will be admitted.  We will rule out myocardial  infarction.  We will start nitroglycerin drip, give hydralazine to get  blood pressure better controlled.  We will cycle cardiac enzymes.  Patient is having worsening unstable angina and uncontrolled  hypertension.  We have discussed the need to proceed with cardiac  catheterization to the patient who  verbalizes understanding.  Our plan, we will get his blood pressure  under control, start Heparin, and give hydralazine intermittently to  give get blood pressure under control as he is bradycardic at present.  We will make further recommendations after catheterization is completed  and blood pressure is well controlled.      Bettey Mare. Lyman Bishop, NP      Bevelyn Buckles. Bensimhon, MD  Electronically Signed    KML/MEDQ  D:  10/01/2007  T:  10/01/2007  Job:  [id number]    cc:   [name], [name]

## 2011-01-31 NOTE — Assessment & Plan Note (Signed)
[facility]                            CARDIOLOGY OFFICE NOTE   William, Weber                 MRN:          [id number]  DATE:10/15/2007                            DOB:          08/31/1930    William Weber is stable today.  I had seen him in on July 02, 2007.  More recently he had some symptoms.  There was consideration of a stress  study.  Also consideration was given to catheterization.  It is outlined  in the chart that the patient preferred to wait.  However, he did have  return of symptoms and he was hospitalized.  On the day of admission he  had pain in his chest.  Also he had radiation to his arm.  When he got  to the hospital he had an acute intervention and he stabilized and went  home.   Since being at home.  He has some vague chest heaviness since some vague  arm heaviness but this does not sound like his original symptom.   PAST MEDICAL HISTORY:   ALLERGIES:  No known drug allergies.   MEDICATIONS:  Plavix 75, aspirin 81, Toprol XL, Vesicare, Aciphex,  lisinopril, multivitamin, calcium, flaxseed and fish oil.   OTHER MEDICAL PROBLEMS:  See the list below.   REVIEW OF SYSTEMS:  Other than the HPI.  He is stable today and feeling  well.   PHYSICAL EXAM:  Blood pressure is 120/58 with a pulse of 60.  The patient is oriented to person, time and place.  Affect is normal.  He is here with his daughter and I have answered any questions from both  of them.  He is oriented x3.  Affect is normal.  HEENT:  Reveals no xanthelasma.  He has normal extraocular motion.  There are no carotid bruits.  There is no jugular venous distention.  Lungs are clear.  Respiratory effort is not labored.  Cardiac exam reveals S1-S2.  His abdomen is soft.  He has no masses or bruits.  He has no significant peripheral edema.   EKG is being done now for new baseline comparison out of the hospital.  Also the patient had carotid Doppler done on  October 11, 2007.  There is  0-39% right internal carotid artery stenosis and 60-79% left internal  carotid artery stenosis on the low end of the range.  It will be  recommended that he had a follow-up in 6 months.   PROBLEMS:  1. History of hiatal hernia.  2. Hypertension treated.  3. Hyperlipidemia.  Over time, we have tried very hard to have him on      a statin and we have never been able to successfully do this.  4. Chronic obstructive pulmonary disease.  5. History of some anxiety.  6. Gastroesophageal reflux disease.  7. Next chronic headache.  8. Prostate carcinoma status post seed implant and TUR.  9. Coronary disease status post CABG.  10.Status post acute event on October 01, 2007.  He had patent grafts      including a LIMA.  Ejection fraction was 60%.  He had a new  95%      proximal circumflex stenosis.  This was treated with a drug eluding      stent.  He is doing well.  11.Next carotid artery stenosis.  In 2002, his carotids are normal by      history.  He now has 60-79% lesion in the low range in his left      internal carotid and this will be followed and 6 months.   No changes in his therapy today.  I will plan to see him back in 3-4  weeks to reassess how he is doing overall.     [name], MD, [facility]  Electronically Signed    JDK/MedQ  DD: 10/15/2007  DT: 10/15/2007  Job #: [id number]   cc:   [name], [name]  Jamison Neighbor, [name]  Maryln Gottron, [name]

## 2011-01-31 NOTE — Assessment & Plan Note (Signed)
[facility]                            CARDIOLOGY OFFICE NOTE   AREND, BAHL                 MRN:          [id number]  DATE:11/19/2007                            DOB:          01-26-1930    HISTORY:  Mr. William Weber is here for cardiac followup.  He is status  post placement of a drug-eluting stent on October 01, 2007.  He tells me  now that he has had significant itching and a rash.  He did not comment  on this in the visit of October 15, 2007, 2 weeks after his Plavix had  been started.  There is question that the current rash could be related  to the Plavix, but it is not clear to me.  He says that when he lies  down at night, it often improves.  He has seen Dr. Andrey Campanile.  Claritin has  been recommended and it may be worthwhile to give him a trial of this  before switching him to Ticlid.   ALLERGIES:  NO KNOWN DRUG ALLERGIES at this point.   MEDICATIONS:  1. Plavix 75.  2. Aspirin 81.  3. Aciphex.  4. Lisinopril 20.  5. Fish oil.  6. He is also on by Bystolic, this was the replacement for his Toprol.      He has had some variation in his blood pressure recently and Dr.      Andrey Campanile had him try the Bystolic.   OTHER MEDICAL PROBLEMS:  See the list below.   REVIEW OF SYSTEMS:  Other than the HPI, his review of systems is  negative.   PHYSICAL EXAMINATION:  VITAL SIGNS:  Blood pressure is 138/75.  Pulse is  51.  GENERAL:  The patient is oriented to person, time and place.  Affect is  normal.  HEENT:  Reveals no xanthelasma.  There is normal extraocular  motion.  NECK:  There are no carotid bruits.  There is no jugular venous  distention.  LUNGS:  Clear.  Respiratory effort is not labored.  CARDIAC:  Reveals S1-S2.  There are no clicks or significant murmurs.  ABDOMEN:  Soft.  There is no peripheral edema.  SKIN:  The patient does not have at this time a discrete rash.  He seems  to have areas that  become pruritic in different  parts of his body at different times.   PROBLEMS:  1. History of hiatal hernia.  2. Hypertension.  Recently, he has had some increasing and decreasing      blood pressure.  I cannot tell if Bystolic is making a significant      difference.  He has tolerated Toprol for a long time.  At this      point, I prefer this medicine until we have further answers about      his rash.  3. Hyperlipidemia.  We have never been able to successfully get him on      a statin.  4. Chronic obstructive pulmonary disease.  5. History of some anxiety.  6. Gastroesophageal reflux disease.  7. Chronic headache.  8. Prostate cancer treated with implant and transurethral  resection.  9. Coronary disease post coronary artery bypass graft.  10.Status post a cardiac event on October 01, 2007.  Cath showed that      his grafts were patent and his LV was good with ejection fraction      of 60%.  He had a 95% proximal circumflex that was stented with a      drug-eluting stent.  11.Carotid disease.  He has 60-79% stenosis and needs a 6 month      followup.  12.Rash.  Etiology is not clear.  It may well be the Plavix.  However,      I am inclined for him to try Claritin for several days.  If he gets      significant improvement, I would continue to take the Plavix.  If      not, he will start Ticlid 250 mg p.o. b.i.d.  We will do early      follow up labs and see him back in the office for followup.     [name], MD, [facility]  Electronically Signed    JDK/MedQ  DD: 11/19/2007  DT: 11/20/2007  Job #: [id number]   cc:   [name], [name]

## 2011-01-31 NOTE — Assessment & Plan Note (Signed)
[facility]                            CARDIOLOGY OFFICE NOTE   William, Weber                 MRN:          [id number]  DATE:02/05/2008                            DOB:          11-May-1930    William Weber is now doing very well.  He is not having any more  itching.  His meds have been adjusted and his blood pressure is under  good control now with very low dose Toprol and low dose  triamterene/hydrochlorothiazide.  He is looking well and feeling well  and remaining active.  He is not having any chest pain.   PAST MEDICAL HISTORY:  Allergies - no known drug allergies.   MEDICATIONS:  1. Plavix 75.  2. Aspirin 81.  3. Calcium.  4. Fish oil.  5. Toprol 25.  6. Triamterene/hydrochlorothiazide 1/2 tablet.  7. AcipHex.   OTHER MEDICAL PROBLEMS:  See the list on my note of November 19, 2007.   REVIEW OF SYSTEMS:  He is doing well and feeling well and his review of  systems is negative.   PHYSICAL EXAMINATION:  VITAL SIGNS:  Blood pressure is 129/67.  Pulse of  60.  GENERAL:  The patient is oriented to person, time and place.  Affect is  normal.  HEENT:  Reveals no xanthelasma.  He has normal extraocular motion.  NECK:  There are no carotid bruits.  There is no jugular venous  distention.  LUNGS:  Lungs are clear.  Respiratory effort is not labored.  CARDIAC:  Reveals an S1 and S2.  There are no clicks or significant  murmurs.  ABDOMEN:  The abdomen is soft.  EXTREMITIES:  There is no peripheral edema.   William Weber is doing quite well.   The problems are listed on my note of November 19, 2007.  #2.  Hypertension.  We now have a good regimen for him.  #3.  Hyperlipidemia.  We have never been able to successfully get him on  a statin.  #10.  Cardiac event on October 01, 2007.  His grafts were patent and his  LV was good with ejection fraction 60%.  He had a 95% proximal circ that  was treated with a drug eluting stent.  He needs to  be on Plavix for at  least 1 year.  Fortunately we have shown that the recent itching that he  had was not related to Plavix.  #11.  Carotid disease.  He needs followup for his disease.  #12.  Rash.  The etiology was never clear but it was not Plavix.  Claritin seemed to help him for a period of time.  I will see him back  in 6 months.     William Abed, MD, [facility]  Electronically Signed    JDK/MedQ  DD: 02/05/2008  DT: 02/05/2008  Job #: [id number]   cc:   William Weber. William Weber, [name]

## 2011-01-31 NOTE — Op Note (Signed)
NAME:  William Weber, William Weber NO.:  [id number]   MEDICAL RECORD NO.:  [id number]          PATIENT TYPE:  AMB   LOCATION:  [facility]                         FACILITY:  [facility]   PHYSICIAN:  Jamison Neighbor, [name]  DATE OF BIRTH:  01-26-1930   DATE OF PROCEDURE:  02/25/2007  DATE OF DISCHARGE:                               OPERATIVE REPORT   PREOPERATIVE DIAGNOSIS:  Carcinoma of the prostate.   POSTOPERATIVE DIAGNOSIS:  Carcinoma of the prostate.   PROCEDURE:  Cystoscopy and radiation seed implantation.   SURGEON:  Jamison Neighbor, [name]   RADIATION ONCOLOGIST:  Maryln Gottron, [name]   ANESTHESIA:  General.   COMPLICATIONS:  None.   DRAINS:  Foley catheter.   BRIEF HISTORY:  This 77 year old male has undergone external beam  radiation therapy.  He has also received hormone therapy because of  known carcinoma of the prostate.  The patient had Gleason adenocarcinoma  score of  8, involving 70% or more of the total prostate tissue.  Bone  scan was negative.  CT scan was negative with no sign of any adenopathy.  The patient has elected to undergo the combination therapy.  The patient  understands the risks and benefits of the procedure and gave full  informed consent.   PROCEDURE:  After successful induction of adequate general anesthesia,  the patient was placed in the dorsal lithotomy position; prepped with  Betadine and draped in the usual sterile fashion.  The Nucleotron system  was used to the image the prostate real time and to make a plan.  The  previously placed fiduciary seeds that were utilized during the external  beam were easily imaged.  The total dose delivered was 90 centigray.  The total number used was 18, and the total number of seeds was 31;  using I-125.  The most remarkable thing about the prostate was how small  it was, following the combination of radiation therapy and hormone  therapy.  The seeds were all successfully implanted, and a follow-up  imaging study showed that all seeds appeared to be in appropriate  position.  The cystoscopic examination revealed a normal urethra.  Beyond the verumontanum there was a wide open prostatic fossa.  The  bladder itself was carefully inspected with both 12 degree and 7 degree  lenses.  No tumors or stones could be seen; this was particularly  important, given the fact that the patient does have a past history of  transitional cell carcinoma.  The ureters were unremarkable.  There was  no evidence of  seed material anywhere within the bladder or prostatic  urethra.  A new Foley catheter was inserted.  The patient tolerated the  procedure well and was taken to the recovery room in good condition.  He  will be sent home with a Foley catheter, which he will remove  tomorrow.  He will have doxycycline as well as Darvocet, and will return  to see me in follow-up.  We will discuss how long we will plan to keep  the hormone therapy in place.  Based on the fact that this is Gleason  score  8,  it is likely that this will be left in place for least 18-24  months if not longer.           ______________________________  Jamison Neighbor, [name]  Electronically Signed     RJE/MEDQ  D:  02/25/2007  T:  02/25/2007  Job:  [id number]   cc:   Maryln Gottron, [name]  Fax: [phone]

## 2011-02-03 NOTE — Op Note (Signed)
NAME:  William Weber, William Weber NO.:  [id number]   MEDICAL RECORD NO.:  [id number]          PATIENT TYPE:  AMB   LOCATION:  [facility]                         FACILITY:  [facility]   PHYSICIAN:  Jamison Neighbor, [name]  DATE OF BIRTH:  01/26/1930   DATE OF PROCEDURE:  08/15/2005  DATE OF DISCHARGE:                                 OPERATIVE REPORT   PREOPERATIVE DIAGNOSIS:  Recurrent transitional cell carcinoma.   POSTOPERATIVE DIAGNOSIS:  Recurrent transitional cell carcinoma.   PROCEDURE:  1.  Cystoscopy.  2.  Bladder biopsy x2.  3.  Fulguration.   SURGEON:  Dr. Logan Bores   ANESTHESIA:  General.   COMPLICATIONS:  None.   DRAINS:  None.   BRIEF HISTORY:  This 77 year old male is found to have recurrent  transitional cell carcinoma of the bladder.  These are very small  recurrences directly adjacent to a previous biopsy and fulguration site on  the left-hand side above the ureter.  This was visualized in the office.  The patient is now to undergo biopsy and fulguration.  He understands the  risks and benefits of the procedure and gave full informed consent.   DESCRIPTION OF PROCEDURE:  After the successful induction of general  anesthesia, the patient was placed in the dorsal lithotomy position and  prepped with Betadine and draped in the usual sterile fashion.  Cystoscopy  was performed.  The urethra was visualized in its entirety and was found to  be normal.  Beyond the verumontanum, there was no significant prostatic  enlargement and no evidence of any bladder outlet obstruction.  The bladder  itself was carefully inspected with both 12-degree and 70-degree lens.  The  patient had the 2 small lesions on the left-hand side that were quite tiny.  These were biopsied and successfully fulgurated.  Careful inspection with  the 12 and 70-degree  lenses showed no evidence of any other recurrences.  Ureters were normal in  appearance, and the urine coming out of each was  unremarkable.  The bladder  was drained.  The patient received a B&O suppository and was taken to the  recovery room in good condition.           ______________________________  Jamison Neighbor, [name]  Electronically Signed     RJE/MEDQ  D:  08/15/2005  T:  08/15/2005  Job:  [id number]   cc:   [name], [name]  Fax: [phone]

## 2011-02-03 NOTE — Assessment & Plan Note (Signed)
[facility]                              CARDIOLOGY OFFICE NOTE   William Weber, William Weber                 MRN:          [id number]  DATE:06/14/2006                            DOB:          19-Apr-1930    HISTORY:  William Weber is doing well.  He has worked extensively with Dr.  Andrey Campanile concerning any potential cholesterol medicines as all of them make  him feel poorly.  He is taking fish oil.  I talked with him about other  issues.  I explained to him that I generally do not encourage over-the-  counter medications in this setting.  He asked about Foltx and I told him  that I did not feel strongly that it would be of any help at this point.   The patient is not having any significant chest pain.  Her is exercising  regularly at the [facility].   ALLERGIES:  No known drug allergies.   MEDICATIONS:  1. Metoprolol 25 mg twice a day.  2. Fish oil.  3. Aspirin 81 mg.  4. Nexium.  5. Multivitamin.   PAST MEDICAL HISTORY:  For other medical problems please see the complete  list below.   REVIEW OF SYSTEMS:  CARDIOVASCULAR:  The patient is doing very well.  He has  very rare and very slight left upper chest discomfort at rest, which does  not sound like angina.  Otherwise his review of systems is negative.   PHYSICAL EXAMINATION:  VITAL SIGNS:  The blood pressure today is  160/80.  He has seen Dr. Andrey Campanile lately and his pressure has been under control.  I am  not changing his medicines at this point.  Pulse is 68.  GENERAL APPEARANCE:  The patient is oriented to person, time and place.  His  affect is normal.  The patient is oriented to person,  time and place.  LUNGS:  The lungs are clear.  CHEST:  Respiratory effort is not labored.  HEENT:  The head, eyes, ears, nose and throat reveals no xanthelasma.  He  has normal extraocular motions.  NECK:  There are no carotid bruits.  There is no jugular venous distention.  HEART:  Cardiac exam reveals an  S1 with an S2.  There are no clicks or  significant murmurs.  ABDOMEN:  The abdomen is soft.  There are no masses or bruits.  EXTREMITIES:  The patient  has no significant peripheral edema.  MUSCULOSKELETAL:  There are musculoskeletal deformities.   LABORATORY DATA:  The EKG is unchanged from the past showing diffuse  nonspecific T wave abnormalities.   PROBLEM LIST:  The patient's problems include:  1. Coronary disease status post coronary artery bypass graft in 2005.  2. Hypercholesterolemia.   Please see the complete discussion above. I am not recommending any changes  at this time.   1. Question of some chronic obstructive pulmonary disease.  2. Hematuria and bladder tumors followed carefully by Dr. Logan Bores.  3. History of some anxiety that is stable.  4. Hypertension; and, he needs follow up for this.  5. Headaches.  In the past we thought  that these were mostly musculoskeletal or nerve root  problems.   William Weber is stable.  I am not changing any of his medicines.  I will  see him for cardiology follow up I one year.            ______________________________  [name], MD, [facility]     JDK/MedQ  DD:  06/14/2006  DT:  06/15/2006  Job #:  [id number]   cc:   [name], [name]  Jamison Neighbor, [name]

## 2011-02-03 NOTE — Discharge Summary (Signed)
[name]  Patient:    William Weber, William Weber                 MRN: [id number] Adm. Date:  [id number] Disc. Date: 05/01/01 Attending:  Charlett Lango Dictator:   Dominica Severin, P.A. CC:         CVTS office  [name], [name] [facility]  [name], [name]   Discharge Summary  DATE OF BIRTH:  23-Dec-1929.  PRIMARY ADMISSION DIAGNOSIS:  Angina pectoris, new, for cardiac catheterization.  SECONDARY DIAGNOSES/PAST MEDICAL HISTORY: 1. Benign prostatic hypertrophy/transurethral prostatectomy due to prostate    cancer. 2. Dyslipidemia. 3. Positive family history of premature coronary artery disease. 4. Gastroesophageal reflux disease/hiatal hernia.  NEW DIAGNOSES/DISCHARGE DIAGNOSES: 1. Left main and three vessel coronary artery disease. 2. Status post coronary artery bypass graft surgery. 3. Postoperative fever resolved. 4. Postoperative atrial flutter, brief episode which is now resolved. 5. Postoperative hypokalemia, resolved.  PROCEDURES: 1. Cardiac catheterization done on April 24, 2001. 2. Coronary artery bypass graft surgery x 5 with the following grafts placed:    a. Left internal mammary artery to left anterior descending artery.    b. Saphenous vein graft to the diagonal branch.    c. Sequential saphenous vein graft to the posterior descending and       posterolateral branch.    d. Saphenous vein graft to the obtuse marginal I branch done on       April 26, 2001. 3. Pre-coronary artery bypass graft surgery Doppler evaluation done on    April 24, 2001.  HOSPITAL COURSE:  This patient is a 77 year old Caucasian male who had presented with new onset class III anginal symptoms.  He was admitted and underwent cardiac catheterization which revealed three vessel coronary artery disease with a 90% distal right stenosis at the takeoffs of the posterior descending.  He also had a 60% left main lesion and therefore, was referred for  coronary artery bypass graft surgery.  Indications, risks and benefits, and alternatives were reviewed with the patient and his family and they agreed to continue. The patient underwent the procedure as stated above on April 26, 2001.  He tolerated the procedure well.  His postoperative course was notable for some postoperative fever on postop day #3 which has since resolved and was felt most likely due to atelectasis and also notable for some postoperative hypokalemia.  His potassium on postop day #4 was noted to be 3.4.  He was replentished and labs were rechecked and his potassium has been replentished.  He remained hemodynamically stable.  He did not have any respiratory complications.  He did have a brief episode of atrial flutter on the morning of postoperative day #4 which has since resolved and it was felt to be related to his hypokalemia and his blood pressure medication was increased to 50 mg of Lopressor b.i.d.  He has not had any episode since then. He has not had any neurological complications or GI complications.  He is ambulated daily by cardiac rehab phase I.  He is anticipated for discharge on the morning of May 01, 2001.  CONDITION ON DISCHARGE:  Stable.  DISCHARGE MEDICATIONS: 1. Coated aspirin 325 mg daily. 2. Lopressor 50 mg twice a day. 3. Prevacid one tablet daily. 4. Ultram 50 mg one to two tablets every four hours as needed for pain.  NOTE:  The patient described seeing red spots after taking Percocet, therefore, he is switched to Ultram for pain  control which has been beneficial.  ACTIVITY:  The patient instructed not to do any driving, avoid strenuous activity, and heavy lifting, anything greater than 10 pounds.  He was told to walk daily and continue his breathing exercises.  DIET:  He is to follow a heart-healthy diet.  WOUND CARE:  He is to wash his incisions with mild soap and water.  Notify the office if any increased redness, swelling or  drainage from any of his incisions or if he has a temperature greater than 101.5.  FOLLOW-UP:  He has a follow-up appointment with [name], [name], on May 13, 2001, and he will have a chest x-ray taken at Dr. Cheron Every office and will bring that chest x-ray with him to his appointment with [name], [name], in three weeks.  He is also instructed to follow up with [name], [name], as directed in approximately four to six weeks.   DD: 04/30/01 TD:  04/30/01 Job: [id number]

## 2011-02-03 NOTE — Op Note (Signed)
NAME:  William Weber, William Weber NO.:  [id number]   MEDICAL RECORD NO.:  [id number]                   PATIENT TYPE:  AMB   LOCATION:  ENDO                                 FACILITY:  [facility]   PHYSICIAN:  Anselmo Rod, [name]               DATE OF BIRTH:  Oct 15, 1929   DATE OF PROCEDURE:  02/04/2004  DATE OF DISCHARGE:                                 OPERATIVE REPORT   PROCEDURE PERFORMED:  Colonoscopy with snare polypectomy x2 and cold  biopsies x6.   ENDOSCOPIST:  Anselmo Rod, [name]   INSTRUMENT USED:  Olympus video colonoscope.   INDICATION FOR PROCEDURE:  A 77 year old white male undergoing screening  colonoscopy for change in bowel habits.  Rule out colonic polyps, masses,  etc.   PREPROCEDURE PREPARATION:  Informed consent was procured from the patient.  The patient was fasted for eight hours prior to the procedure and prepped  with a bottle of magnesium citrate and a gallon of GoLYTELY the night prior  to the procedure.   PREPROCEDURE PHYSICAL:  VITAL SIGNS:  The patient had stable vital signs.  NECK:  Supple.  CHEST:  Clear to auscultation.  S1, S2 regular.  ABDOMEN:  Soft with normal bowel sounds.   DESCRIPTION OF PROCEDURE:  The patient was placed in the left lateral  decubitus position and sedated with 60 mg of Demerol and 6 mg of Versed  intravenously.  Once the patient was adequately sedate and maintained on low-  flow oxygen and continuous cardiac monitoring, the Olympus video colonoscope  was advanced from the rectum to the cecum.  A few early left-sided  diverticula were seen.  Six small sessile polyps were biopsied from the  rectum.  Two small sessile polyps were snared from the rectum.  Small  internal hemorrhoids were seen on retroflexion.  No other abnormalities were  noted.  The patient tolerated the procedure well without immediate  complications.   IMPRESSION:  1. Multiple polyps removed from the rectum, six by cold biopsy and  two by     snare polypectomy.  2. Small internal hemorrhoids.  3. Early left-sided diverticula.   RECOMMENDATIONS:  1. Continue a high-fiber diet with liberal fluid intake.  2. Await pathology results.  3. Repeat CRC screening depending on pathology results.  4. Avoid all nonsteroidals including aspirin for the next four weeks.  5. Outpatient follow-up in the next two weeks for further recommendations.                                               Anselmo Rod, [name]    JNM/MEDQ  D:  02/04/2004  T:  02/05/2004  Job:  [id number]   cc:   [name], [name]  [address]  [place]  [place] [address]  Fax: [phone]

## 2011-02-03 NOTE — Op Note (Signed)
NAME:  William Weber, OZMENT NO.:  [id number]   MEDICAL RECORD NO.:  [id number]          PATIENT TYPE:  AMB   LOCATION:  [facility]                         FACILITY:  [facility]   PHYSICIAN:  Jamison Neighbor, [name]  DATE OF BIRTH:  11-Sep-1930   DATE OF PROCEDURE:  10/14/2004  DATE OF DISCHARGE:                                 OPERATIVE REPORT   PREOPERATIVE DIAGNOSIS:  Transitional cell carcinoma.   POSTOPERATIVE DIAGNOSIS:  Transitional cell carcinoma.   PROCEDURE:  1.  Cystoscopy.  2.  TURB.  3.  Bilateral retrogrades with interpretation.  4.  Mitomycin C instillation.   SURGEON:  Jamison Neighbor, [name]   ANESTHESIA:  General.   COMPLICATIONS:  None.   SPECIMENS:  None (with resection, tiny tumors vaporized and no real specimen  obtained).   BRIEF HISTORY:  This 77 year old male has a yearly cystoscopy because of a  past problem with TCC, and his recent cystoscopy showed small recurrent  frond-like lesions on the left-hand side.  The patient is now to undergo  TURBT or possible fulguration.  The patient understands the risks and  benefits of the procedure and gave full informed consent.   DESCRIPTION OF PROCEDURE:  After successful induction of general anesthesia,  the patient was placed in the dorsal lithotomy position, prepped with  Betadine, and draped in the usual sterile fashion.  Cystoscopy was  performed.  The urethra was visualized in its entirety and was found to be  normal.  Beyond the verumontanum, he has an absolutely wide open prostate  with no evidence of any obstruction.  The patient has no bladder neck  contracture or any other abnormalities.  The bladder was carefully  inspected.  The right ureteral orifice was normal.  The left ureteral  orifice was somewhat hard to see.  Just lateral to the general area in the  left ureteral orifice, there were multiple tiny small frond-like lesions.  The resectoscope was inserted, and these were carefully resected.   As they  were touched with the resectoscope, they were so small that they in essence  were fulgurated, and there really was not an adequate specimen.  They were  so superficial; however, and the previous specimens never showed any  evidence of muscle invasion, and it was felt that simple fulguration as  opposed to deep resection would be adequate.  A retrograde study was  performed on each side.  The left ureteral orifice was actually somewhat  difficult to find but  eventually was seen directly adjacent to but not involved by the tumor.  The  retrograde on each side was normal.  The bladder was drained, and the  patient had Mitomycin C placed in the bladder to prevent recurrence.  The  patient tolerated the procedure well and was taken to the recovery room in  good condition.      RJE/MEDQ  D:  10/14/2004  T:  10/14/2004  Job:  [id number]   cc:   [name], [name]  [address]  [place]  [place] [address]  Fax: [phone]

## 2011-02-03 NOTE — Op Note (Signed)
[place]. [facility]  Patient:    William Weber, William Weber                 MRN: [id number] Proc. Date: 04/26/01 Adm. Date:  [id number] Attending:  Charlett Lango CC:         [name], [name]  [name], [name] [facility]   Operative Report  PREOPERATIVE DIAGNOSIS:  Left main and three-vessel disease with new onset angina.  POSTOPERATIVE DIAGNOSIS:  Left main and three-vessel disease with new onset angina.  OPERATION:  Median sternotomy, extracorporeal circulation, coronary artery bypass grafting x 5, (left internal mammary artery to the left anterior descending, saphenous vein graft to the first diagonal, saphenous vein graft to the first obtuse marginal, sequential saphenous vein graft to the posterior descending and posterior lateral).  SURGEON:  Salvatore Decent. Dorris Fetch, [name]  FIRST ASSISTANT:  Lissa Merlin, P.A.  ANESTHESIA:  General.  FINDINGS:  Heavily calcified left main, posterior descending fair quality, OM-I poor quality, remaining targets good quality.  Conduits good quality. Good left ventricular function.  Lung hyperinflation consistent with COPD.  INDICATIONS:  William Weber is a 77 year old gentleman who presented with new onset class III anginal symptoms.  He was admitted and underwent cardiac catheterization which revealed three vessel disease with a 90% distal right stenosis at the take-off of the posterior descending.  She also had a 60% left main lesion and therefore was referred for coronary artery bypass grafting. The indications, risks, benefits and alternatives to the procedures were discussed in detail with the patient and his daughter.  They understood and accepted the risks and agreed to proceed.  DESCRIPTION OF PROCEDURE:  William Weber was brought to the preop holding area on April 26, 2001.  Lines were placed to monitor arterial, central venous and arterial pressure.  EKG leads were placed for continuous telemetry.   The patient was taken to the operating room, anesthetized and intubated.  A Foley catheter was placed.  Intravenous antibiotics were administered.  The chest, abdomen and legs were prepped and draped in the usual fashion.  A median sternotomy then was performed.  Simultaneously, and incision was made in the medial aspect of the right leg and the greater saphenous vein was harvested from the ankle to the mid thigh.  The saphenous vein was of excellent quality. The left internal mammary artery was harvested in the standard fashion.  It was a 2 mm good quality vessel.  Superiorly, beginning at the fourth interspace, the mammary pedicle was densely adherent to the costal cartilages which made the dissection difficult.  However, there was no injury to the mammary artery.  There was excellent flow to the distal end of the mammary artery when it was divided.  The patient was fully heparinized prior to dividing the distal end of the mammary artery.  The pericardium was opened.  The ascending aorta was opened and palpated after confirming adequate anticoagulation with ACT measurement.  The aorta was cannulated via concentric 2-0 Ethibond pledgeted pursestring sutures.  A dual stage venous cannula was placed via pursestring suture in the right atrial appendage.  Cardiopulmonary bypass was instituted, and the patient was cooled to 32 degrees Celsius.  The coronary arteries were inspected and anastomotic sites were chosen.  The conduits were inspected and cut to length.  The foam pad was placed in the pericardium to protect the phrenic nerve.  A temperature probe was placed in the myocardial septum, and a cardioplegia cannula was placed in the ascending  aorta.  The aorta was cross-clamped.  The left ventricle was emptied via the aortic root vent.  Cardiac arrest then was achieved with a combination of cold antegrade blood cardioplegia and topical iced saline.  After achieving a complete diastolic  arrest and a myocardial septal temperature of 12 degrees Celsius, the following distal anastomosis were performed.  First, a reverse saphenous vein graft was placed sequentially to the posterior descending and posterior lateral branches of the right coronary artery.  The posterior descending was a 1.5 mm fair quality vessel.  It was diffusely diseased.  The anastomosis was performed with a running 7-0 Prolene suture in an end-to-side fashion.  An end-to-side anastomosis was then constructed to the posterior lateral.  This was a 1.8 mm good quality target.  Both anastomoses were performed with running 7-0 Prolene sutures. There was excellent flow through the graft.  Cardioplegia was administered. There was good hemostasis at both anastomoses.  Next, a reverse saphenous vein graft was placed end-to-side to the first obtuse marginal branch of the left circumflex coronary artery.  This was a small 1 mm poor quality vessel.  It did have a 90% stenosis proximally but the ramus intermedius and all of the marginals were very small vessels.  The vein graft was of good quality.  The anastomosis was performed with a running 7-0 Prolene suture.  At the completion of the anastomosis, the graft was flushed. Flow was adequate.  The anastomosis had been probed proximally and distally to ensure patency.  Cardioplegia was administered, and there was good hemostasis.  Next, a reverse saphenous vein graft was placed end-to-side to the first diagonal branch of the LAD.  This was a 1.8 mm good quality coronary.  The vein graft was of good quality.  The anastomosis was performed with a running 7-0 Prolene suture.  At the completion of the anastomosis, there was excellent flow through the graft.  Cardioplegia was administered, and there was good hemostasis.  Next, the left internal mammary artery was brought through a window in the pericardium anterior to the left phrenic nerve.  The distal end of the  mammary  artery was spatulated.  It was anastomosed end-to-side to the distal left anterior descending.  The distal left anterior descending was diffusely diseased.  It was of good quality at the site of the anastomosis.  The anastomosis was performed with a running 8-0 Prolene suture.  At the completion of the mammary to LAD anastomosis, the bulldog clamp was removed from the mammary artery.  Immediate and rapid septal rewarming was noted. Lidocaine as administered.  There was good hemostasis.  The aortic cross-clamp was removed.  The total cross-clamp time was 59 minutes.  The patient required a single defibrillation with 20 joules and remained in sinus rhythm.  A partial occlusion clamp was placed on the ascending aorta.  The cardioplegia cannula was removed.  The proximal vein graft anastomoses were performed to 4.4 mm punch aortotomies with running 6-0 Prolene sutures.  At the completion of the final proximal anastomosis, the patient was placed in Trendelenburg position.  Air was allowed to vent as the partial clamp was removed.  Air was aspirated from each of the vein grafts.  The bulldog clamps were removed.  All proximal and distal anastomosis were inspected for hemostasis.  Epicardial pacings wires were placed on the right ventricle and right atrium.  The patient was being rewarmed during the proximal anastomosis, and when the core temperature reached 37 degrees Celsius, he was weaned  from cardiopulmonary bypass.  The patient was weaned from bypass without inotropic support. Initial cardiac index was greater than 2 liters per minute per meter sq, and the patient remained hemodynamically stable throughout the post bypass period. Total bypass time was 122 minutes.  A test dose of Protamine was administered and was well tolerated.  The atrial and aortic cannulae were removed.  There was good hemostasis at both cannulation sites.  The remainder of the Protamine was administered  without incident.  The chest was irrigated with one liter of warm normal saline containing 1 g of vancomycin.  Hemostasis was achieved.  The pericardium was loosely reapproximated with interrupted 3-0 silk sutures and came together easily without tension.  Care was taken not to disturb the underlying grafts. The left pleural and two mediastinal chest tubes were placed through separate subcostal incisions.  The sternum was closed with a heavy gauge stainless steel wires.  The pectoralis fascia was closed with a running #1 Vicryl suture. The subcutaneous tissue was closed with a running 2-0 Vicryl suture, and the skin was closed with a 3-0 Vicryl subcuticular suture.  The leg incision was closed in two layers.  All sponge needle and instrument counts were correct at the end of the procedure.  The patient remained hemodynamically stable and was taken from the operating room to the surgical intensive care unit in good condition. DD:  04/26/01 TD:  04/28/01 Job: [id number]

## 2011-02-03 NOTE — Op Note (Signed)
NAME:  William Weber, William Weber NO.:  [id number]   MEDICAL RECORD NO.:  [id number]                   PATIENT TYPE:  AMB   LOCATION:  [facility]                                 FACILITY:  [facility]   PHYSICIAN:  Jamison Neighbor, [name]               DATE OF BIRTH:  September 30, 1929   DATE OF PROCEDURE:  10/03/2002  DATE OF DISCHARGE:                                 OPERATIVE REPORT   PREOPERATIVE DIAGNOSIS:  Recurrent transitional cell carcinoma of the  bladder.   POSTOPERATIVE DIAGNOSIS:  Recurrent transitional cell carcinoma of the  bladder.   PROCEDURE:  Cystoscopy and transurethral resection of bladder tumor.   SURGEON:  Jamison Neighbor, [name]   ANESTHESIA:  General.   COMPLICATIONS:  None.   DRAINS:  None.   BRIEF HISTORY:  This 77 year old male has a past history of transitional  cell carcinoma.  He is now to undergo repeat cystoscopy with removal of a  small bladder tumor that was found during recent surveillance cystoscopy.  He understands the risks and benefits of the procedure and gave full and  informed consent.   DESCRIPTION OF PROCEDURE:  After the successful induction of general  anesthesia, the patient was placed in the dorsal lithotomy position and  prepped with Betadine and draped in the usual sterile fashion.  Cystoscopy  was performed, and the urethra was visualized in its entirety.  It was found  to be normal.  Beyond the verumontanum the prostate was wide.  There was no  significant obstruction noted.  The bladder was carefully inspected.  There  was a tumor adjacent to the previous resection site on the left-hand side  out lateral to the ureteral orifice as well as somewhat superior to that  structure.  The remainder of the bladder was carefully inspected with both  12 degree and 70 degree lenses.  No other tumors could be seen.  The area in  question was cold-cup biopsied.  Two cold cups were used to remove all of  the obvious tissue.  This was  sent for pathology.  The biopsy base was  cauterized with  electrocautery.  The patient tolerated the procedure well and was taken to  the recovery room in good condition.  He will be sent home with Lorcet Plus  for any pain as well as Levaquin for three days, return to the office in  follow-up, and a decision will be made at that time as to whether he should  undergo BCG therapy.                                               Jamison Neighbor, [name]    RJE/MEDQ  D:  10/03/2002  T:  10/04/2002  Job:  [id number]   cc:   [name], [name]  [address]  [place]   [address]  Fax: [phone]

## 2011-02-03 NOTE — Op Note (Signed)
[facility]  Patient:    William Weber, William Weber Visit Number: [id number] MRN: [id number]          Service Type: NES Location: N[facility] Attending Physician:  Londell Moh Dictated by:   Jamison Neighbor, [name] Proc. Date: 07/23/01 Admit Date:  07/23/2001 Discharge Date: 07/23/2001   CC:         Merlyn Albert H. Andrey Campanile, [name]   Operative Report  PREOPERATIVE DIAGNOSES:  Transitional cell carcinoma of the bladder, microscopic hematuria.  POSTOPERATIVE DIAGNOSES:  Transitional cell carcinoma of the bladder, microscopic hematuria.  PROCEDURES:  Cystoscopy, bilateral retrograde and transurethral resection of the bladder.  SURGEON:  Jamison Neighbor, [name]  ANESTHESIA:  General.  COMPLICATIONS:  None.  DRAINS:  A 20 French catheter.  BRIEF HISTORY:   This 77 year old male developed hematuria and underwent evalj .  He was found on cystoscopy to have several superficial tumors out lateral to the left ureteral orifice.  He is now to undergo resection of his tumors plus bilateral retrogrades.  He understands the risks and benefits of the procedure and gave fully informed consent.  DESCRIPTION OF PROCEDURE:  After successful induction of general endotracheal anesthesia, the patient was placed in the dorsal lithotomy position and prepped with Betadine and draped in the usual sterile fashion.  Cystoscopy was performed and the bladder was carefully inspected.  No stones could be seen. Both ureteral orifices were unremarkable in their appearance.  Out lateral to the left hand ureter, the patient had the previously described superficial bladder tumors.  The patient on bilateral retrogrades which showed normal upper tracts.  The cystoscope was removed.  The urethra was dilated to 30-French with Sissy Hoff sounds.  The resectoscope was then inserted.  The patient underwent resection of the tumors with cauterization of the base.  The resection went down into the muscle  in order to insure that this was not invasive disease.  Because the bladder looked a little thin, it was felt that it would be best to place a Foley catheter and leave it in place until next week.  The bladder was drained.  The chips were removed.  The patient tolerated the procedure well and was taken to the recovery room in good condition.  It should be noted that because the resection was close to the ureteral orifice, a ureteral catheter was passed at the end of the procedure, passed easily with no injury whatsoever to the ureter along its course from the resection.  The Foley catheter was placed to straight drainage.  The patient was given instructions on how to use the catheter.  He was given intraoperative Toradol and Zofran as well as a B&S suppository and lidocaine instilled into the urethra.   The patient will be sent home with Lorcet Plus, Pyridium Plus and Septra DS and will return to see me next week. Dictated by:   Jamison Neighbor, [name] Attending Physician:  Londell Moh DD:  07/23/01 TD:  07/25/01 Job: [id number]

## 2011-02-03 NOTE — Consult Note (Signed)
[place]. [facility]  Patient:    William Weber, William Weber                 MRN: [id number] Proc. Date: 04/24/01 Adm. Date:  [id number] Attending:  Glennon Weber CC:         William Weber, [name] [facility]  William Weber. William Weber, [name]   Consultation Report  REASON FOR CONSULTATION:  Left main coronary disease.  CHIEF COMPLAINT:  Exertional chest pain.  HISTORY OF PRESENT ILLNESS:  Mr. William Weber is a 77 year old male with no prior history of heart disease.  He had noted approximately two weeks ago while vacationing three episodes of exertional chest pain.  The first occurred while swimming laps.  At the end of one lap with breast stroke he developed a severe burning discomfort in his substernal region superiorly.  He rested and had resolution of his discomfort.  He did not have any radiation, diaphoresis, nausea, or vomiting.  He had two other episodes, again while exerting himself and both again were relieved with rest.  He does have a strange sort of milder burning sensation which is there from time to time at rest, but is not of the same severity or quite the same nature as the exertional pain.  He was seen by Dr. Benedetto Weber and found to have some small Q-waves in leads 1 and AVL and referred to Dr. Myrtis Weber for further evaluation.  He then was brought in today for cardiac catheterization which revealed left main disease as well as three vessel coronary disease.  His left ventricular function was well preserved.  PAST MEDICAL HISTORY:  Significant for prostate cancer seven years ago treated with a TURP.  He has had some orthopedic injuries, broken hands, elbows, and back problems.  He has had hypercholesterolemia.  He has had a hiatal hernia. He has not had any history of hypertension, diabetes, stroke, or TIA.  MEDICATIONS: 1. Prevacid 15 mg p.o. q.d. 2. Garlic tablets.  ALLERGIES:  No known drug allergies.  FAMILY HISTORY:  Positive for coronary disease.  SOCIAL  HISTORY:  He is married with two children.  He is retired from [employer] for seven years.  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  He uses chainsaws eight hours a day.  He does not use alcohol or tobacco.  REVIEW OF SYSTEMS:  He has had no cough, sputum, hemoptysis, stroke, or TIA symptoms.  No PND, orthopnea, or peripheral edema.  He does have some remote compression fractures in his lower back and some degenerative joint disease. He has had no recent change in bowel or bladder habits.  He will occasionally have some heartburn but this is not new.  He has no history of bleeding or clotting abnormalities.  All other systems are negative.  PHYSICAL EXAMINATION  GENERAL:  Mr. William Weber is a well appearing 77 year old white male in no acute distress.  He is well-developed and well-nourished and appears to be in good physical condition.  VITAL SIGNS:  Blood pressure 166/99, heart rate 71, respirations 16 and unlabored.  HEENT:  Unremarkable.  NEUROLOGIC:  Alert and oriented x 3 and grossly intact.  NECK:  Supple without thyromegaly, adenopathy, JVD, or bruits.  CARDIAC:  Regular rate and rhythm.  Normal S1 and S2.  No rubs, murmurs, or gallops.  LUNGS:  Clear to auscultation and percussion with no rales or wheezing.  ABDOMEN:  Soft and nontender.  EXTREMITIES:  Without cyanosis, clubbing, or edema.  He has  2+ pulses throughout all four extremities.  SKIN:  Intact.  LABORATORIES:  Cardiac catheterization films were reviewed.  His EKG shows normal sinus rhythm with left axis deviation and nonspecific T-wave abnormalities.  Chest x-ray shows changes consistent with COPD, but no acute abnormalities.  Coags were normal.  Potassium 4.7, BUN and creatinine 16 and 0.8.  White count 4.2, hematocrit 42, platelet count 230,000.  IMPRESSION:  Mr. William Weber is a 77 year old gentleman with new onset angina. He underwent cardiac catheterization today which revealed  significant left main and three vessel coronary disease with overall good left ventricular function.  He is in excellent physical condition and is an excellent candidate for coronary artery bypass grafting.  Coronary artery bypass grafting is indicated in the presence of left main disease for relief of symptoms and long-term survival.  The indications, risks, benefits, and alternative procedures were discussed in detail with the patient and his daughter.  They understand that while there can be no guarantee of successful result, he has a greater than 90% chance of relief of symptoms and will have survival benefit with bypass surgery versus medical therapy.  He understands that the nature of the operation, the planned approach.  He understands the risks include, but are not limited to, death, stroke, MI, bleeding, possible need for blood transfusions, DVT, PE, infection, other organ systems dysfunction including respiratory, renal, hepatic, and GI.  He understands and accepts these risks and agrees to proceed.  Will plan for surgery Friday second case which is the first available OR time. DD:  04/24/01 TD:  04/25/01 Job: [id number]

## 2011-02-03 NOTE — Cardiovascular Report (Signed)
[place]. [facility]  Patient:    William Weber, William Weber                 MRN: [id number] Proc. Date: 04/24/01 Adm. Date:  [id number] Attending:  Glennon Hamilton CC:         Dr. Marilynne Halsted, [name] [facility]   Cardiac Catheterization  DATE OF BIRTH:  [birth year]  REFERRING PHYSICIAN:  Dr. Andrey Campanile  CARDIOLOGIST:  [name], [name]  PROCEDURES PERFORMED: 1. Left heart catheterization with selective coronary angiography. 2. Ventriculography.  INDICATIONS:  Anginal exertional, stable.  413.9.  DIAGNOSES: 1. A 50-60% distal left main coronary artery stenosis. 2. High-grade distal right coronary artery stenosis compromising two    posterolateral branches and a large posterior descending artery. 3. Normal left ventricular systolic function. 4. Hypertension.  HISTORY:  The pain is a 77 year old male with a history of dyslipidemia, as well as a family history of premature coronary artery disease.  The patient was seen in the office by Dr. Myrtis Ser after being referred by Dr. Andrey Campanile due to new onset exertional chest pain.  The patient was subsequently referred for diagnostic catheterization to assess his coronary anatomy.  DESCRIPTION OF PROCEDURE:  After informed consent was obtained, the patient was brought to the catheterization laboratory and a 6 French arterial sheath was placed in the right femoral artery after injection with 1% lidocaine. The 6 French sheath was placed using the modified Seldinger technique. Subsequently, a preformed 6 [place] and JL4 catheters were used to engage the left and right coronary system, respectively.  Selective angiography was then performed in various projections using manual injection of contrast. After selective coronary angiography, ventriculography was performed using a 6 French angled pigtail catheter.  Appropriate left-sided hemodynamics were obtained.  Ventriculography was performed using a single plane  RAO projection using power injection of contrast.  At the termination of the case, all catheters and sheath were removed after adequate blood pressure control was obtained.  The patient was subsequently brought to the holding area.  Adequate hemostasis was obtained and no complications were noted.  FINDINGS: 1. Hemodynamics:  Left ventricular pressure 198/18 mmHg.  Aortic pressure    is 198/86 mmHg. 2. Ventriculography:  A 50-55% ejection fraction.  No segmental wall motion    abnormalities.  No mitral regurgitation.  SELECTIVE CORONARY ANGIOGRAPHY: 1. Left main coronary was a large caliber vessel with distal tapering with    a focal 50-60% stenosis.  In the RAO projection there was some haziness    in the distal vessel. 2. Left anterior descending artery was a large caliber vessel with 50%    focal stenosis extending from the lesion from the left main coronary    artery.  The left anterior descending artery has two diagonal branches,    the first being the largest one.  There is a 40% focal stenosis in this    first diagonal branch.  There is no other flow-limiting coronary artery    disease noted in the diagonal distribution. 3. Ramus is a small vessel with no evidence for flow-limiting coronary artery    disease. 4. Left circumflex coronary artery is a large caliber vessel giving rise to    three obtuse marginal branches.  The first obtuse marginal branch is a    moderate sized vessel with a proximal focal 90% stenosis. 5. Right coronary artery:  The right coronary artery is a large caliber vessel    giving rise  to two posterolateral branches and a large posterior descending    artery.  The proximal ostia has a diffuse 20-30% stenosis where as the    distal vessel has a focal 90% stenosis involving the posterior descending    artery.  CONCLUSIONS: 1. Significant left main coronary artery with 50-60% stenosis, high-grade    right coronary artery stenosis of 90% involving the  posterior descending    artery. 2. Normal left ventricular systolic function. 3. Severe hypertension.  RECOMMENDATIONS:  Angiographic images will be reviewed with Dr. Chales Abrahams.  At this point in time, I would be leaning towards coronary artery bypass grafting for complete surgical revascularization.  The decision of PCI to the distal right coronary artery will be discussed but this would probably not be a very favorable option. DD:  04/24/01 TD:  04/25/01 Job: [id number]

## 2011-03-08 ENCOUNTER — Telehealth: Payer: Self-pay | Admitting: Cardiology

## 2011-03-08 NOTE — Telephone Encounter (Signed)
Per Dr Myrtis Ser ok to hold Plavix would prefer pt continue ASA if possible pt is aware and will continue ASA, note faxed to Dr Dagoberto Ligas at [facility] at [phone]

## 2011-03-08 NOTE — Telephone Encounter (Signed)
Pt will be having a cataract removed from his eye and he needs to stop some medication and he wants to let you know what that is and make sure it is ok

## 2011-03-08 NOTE — Telephone Encounter (Signed)
Pt scheduled for cataract surgery on Tue they want him to hold his ASA and Plavix starting tom. Will check w/Dr Myrtis Ser and call him back

## 2011-03-09 NOTE — Telephone Encounter (Signed)
agree

## 2011-05-19 ENCOUNTER — Other Ambulatory Visit: Payer: Self-pay | Admitting: Cardiology

## 2011-05-19 DIAGNOSIS — I6529 Occlusion and stenosis of unspecified carotid artery: Secondary | ICD-10-CM

## 2011-05-23 ENCOUNTER — Encounter (INDEPENDENT_AMBULATORY_CARE_PROVIDER_SITE_OTHER): Payer: Medicare Other | Admitting: *Deleted

## 2011-05-23 DIAGNOSIS — I6529 Occlusion and stenosis of unspecified carotid artery: Secondary | ICD-10-CM

## 2011-05-29 NOTE — Progress Notes (Signed)
N/A.  LMTC. 

## 2011-05-30 ENCOUNTER — Telehealth: Payer: Self-pay | Admitting: Cardiology

## 2011-05-30 NOTE — Telephone Encounter (Signed)
Pt notified of results

## 2011-05-30 NOTE — Telephone Encounter (Signed)
Pt rtn call to nurse re results of carotid,  Ok to leave message, will be at hospital with wife, but can call back if he needs to be talked with

## 2011-06-07 LAB — COMPREHENSIVE METABOLIC PANEL
ALT: 54 — ABNORMAL HIGH
ALT: 60 — ABNORMAL HIGH
AST: 35
AST: 41 — ABNORMAL HIGH
Albumin: 3.7
Albumin: 4.1
Alkaline Phosphatase: 34 — ABNORMAL LOW
Alkaline Phosphatase: 43
BUN: 10
BUN: 7
CO2: 24
CO2: 28
Calcium: 9.1
Calcium: 9.1
Chloride: 103
Chloride: 104
Creatinine, Ser: 0.76
Creatinine, Ser: 0.86
GFR calc Af Amer: >60
GFR calc non Af Amer: >60
GFR calc non Af Amer: >60
Glucose, Bld: 109 — ABNORMAL HIGH
Glucose, Bld: 123 — ABNORMAL HIGH
Potassium: 3.8
Potassium: 4
Sodium: 139
Sodium: 140
Total Bilirubin: 0.7
Total Bilirubin: 0.8
Total Protein: 6.3
Total Protein: 6.8

## 2011-06-07 LAB — CBC
HCT: 36.6 — ABNORMAL LOW
HCT: 41.3
Hemoglobin: 12.8 — ABNORMAL LOW
Hemoglobin: 14.3
MCHC: 34.6
MCHC: 35.1
MCV: 88.7
MCV: 89.3
Platelets: 160
Platelets: 168
RBC: 4.1 — ABNORMAL LOW
RBC: 4.66
RDW: 13.6
RDW: 13.8
WBC: 3.1 — ABNORMAL LOW
WBC: 6.7

## 2011-06-07 LAB — PROTIME-INR
INR: 1
INR: 1
Prothrombin Time: 13.5
Prothrombin Time: 13.6

## 2011-06-07 LAB — APTT
aPTT: 29
aPTT: 30

## 2011-06-07 LAB — CARDIAC PANEL(CRET KIN+CKTOT+MB+TROPI)
CK, MB: 2.2
CK, MB: 2.5
Relative Index: 1.9
Relative Index: 1.9
Total CK: 117
Total CK: 133

## 2011-06-07 LAB — DIFFERENTIAL
Basophils Absolute: 0
Basophils Absolute: 0
Basophils Relative: 0
Basophils Relative: 0
Eosinophils Absolute: 0.1
Eosinophils Absolute: 0.1
Eosinophils Relative: 1
Eosinophils Relative: 3
Lymphocytes Relative: 11 — ABNORMAL LOW
Lymphocytes Relative: 33
Lymphs Abs: 0.7
Lymphs Abs: 1
Monocytes Absolute: 0.3
Monocytes Absolute: 0.5
Monocytes Relative: 7
Monocytes Relative: 9
Neutro Abs: 1.7
Neutro Abs: 5.4
Neutrophils Relative %: 56
Neutrophils Relative %: 81 — ABNORMAL HIGH

## 2011-06-07 LAB — LIPID PANEL
Cholesterol: 202 — ABNORMAL HIGH
HDL: 34 — ABNORMAL LOW
LDL Cholesterol: 143 — ABNORMAL HIGH
Total CHOL/HDL Ratio: 5.9
Triglycerides: 123
VLDL: 25

## 2011-06-07 LAB — MAGNESIUM: Magnesium: 2

## 2011-06-07 LAB — CK TOTAL AND CKMB (NOT AT ARMC)
CK, MB: 3.3
Relative Index: 1.8
Total CK: 184

## 2011-06-07 LAB — TROPONIN I

## 2011-07-06 LAB — COMPREHENSIVE METABOLIC PANEL
ALT: 52
AST: 36
Albumin: 3.7
Alkaline Phosphatase: 37 — ABNORMAL LOW
BUN: 13
CO2: 27
Calcium: 9.1
Chloride: 106
Creatinine, Ser: 0.75
GFR calc Af Amer: >60
GFR calc non Af Amer: >60
Glucose, Bld: 94
Potassium: 4
Sodium: 140
Total Bilirubin: 0.7
Total Protein: 6.4

## 2011-07-06 LAB — PROTIME-INR
INR: 1.1
Prothrombin Time: 13.9

## 2011-07-06 LAB — CBC
HCT: 38 — ABNORMAL LOW
Hemoglobin: 13.3
MCHC: 35
MCV: 88.6
Platelets: 177
RBC: 4.29
RDW: 13.5
WBC: 3.2 — ABNORMAL LOW

## 2011-07-06 LAB — APTT: aPTT: 29

## 2011-09-27 ENCOUNTER — Encounter: Payer: Self-pay | Admitting: Cardiology

## 2011-09-27 DIAGNOSIS — R21 Rash and other nonspecific skin eruption: Secondary | ICD-10-CM | POA: Insufficient documentation

## 2011-09-27 DIAGNOSIS — K219 Gastro-esophageal reflux disease without esophagitis: Secondary | ICD-10-CM | POA: Insufficient documentation

## 2011-09-27 DIAGNOSIS — Z951 Presence of aortocoronary bypass graft: Secondary | ICD-10-CM | POA: Insufficient documentation

## 2011-09-27 DIAGNOSIS — F419 Anxiety disorder, unspecified: Secondary | ICD-10-CM | POA: Insufficient documentation

## 2011-09-27 DIAGNOSIS — R51 Headache: Secondary | ICD-10-CM

## 2011-09-27 DIAGNOSIS — Z789 Other specified health status: Secondary | ICD-10-CM | POA: Insufficient documentation

## 2011-09-27 DIAGNOSIS — I2510 Atherosclerotic heart disease of native coronary artery without angina pectoris: Secondary | ICD-10-CM | POA: Insufficient documentation

## 2011-09-27 DIAGNOSIS — R9430 Abnormal result of cardiovascular function study, unspecified: Secondary | ICD-10-CM | POA: Insufficient documentation

## 2011-09-27 DIAGNOSIS — C61 Malignant neoplasm of prostate: Secondary | ICD-10-CM | POA: Insufficient documentation

## 2011-09-27 DIAGNOSIS — I779 Disorder of arteries and arterioles, unspecified: Secondary | ICD-10-CM | POA: Insufficient documentation

## 2011-09-27 DIAGNOSIS — I25110 Atherosclerotic heart disease of native coronary artery with unstable angina pectoris: Secondary | ICD-10-CM | POA: Insufficient documentation

## 2011-09-27 DIAGNOSIS — G8929 Other chronic pain: Secondary | ICD-10-CM | POA: Insufficient documentation

## 2011-09-27 DIAGNOSIS — E785 Hyperlipidemia, unspecified: Secondary | ICD-10-CM | POA: Insufficient documentation

## 2011-09-27 DIAGNOSIS — I10 Essential (primary) hypertension: Secondary | ICD-10-CM | POA: Insufficient documentation

## 2011-09-27 DIAGNOSIS — J449 Chronic obstructive pulmonary disease, unspecified: Secondary | ICD-10-CM | POA: Insufficient documentation

## 2011-09-27 DIAGNOSIS — R519 Headache, unspecified: Secondary | ICD-10-CM | POA: Insufficient documentation

## 2011-09-27 DIAGNOSIS — I739 Peripheral vascular disease, unspecified: Secondary | ICD-10-CM

## 2011-09-28 ENCOUNTER — Ambulatory Visit (INDEPENDENT_AMBULATORY_CARE_PROVIDER_SITE_OTHER): Payer: Medicare Other | Admitting: Cardiology

## 2011-09-28 ENCOUNTER — Encounter: Payer: Self-pay | Admitting: Cardiology

## 2011-09-28 DIAGNOSIS — I2510 Atherosclerotic heart disease of native coronary artery without angina pectoris: Secondary | ICD-10-CM

## 2011-09-28 DIAGNOSIS — F419 Anxiety disorder, unspecified: Secondary | ICD-10-CM

## 2011-09-28 DIAGNOSIS — I779 Disorder of arteries and arterioles, unspecified: Secondary | ICD-10-CM

## 2011-09-28 DIAGNOSIS — F411 Generalized anxiety disorder: Secondary | ICD-10-CM

## 2011-09-28 NOTE — Assessment & Plan Note (Signed)
The patient really is not having any significant anxiety. He spends all of this time caring for his wife. They have been together since childhood. She has severe disease and is very limited. I discussed this with him at length. He is doing the best he can to take care of himself.

## 2011-09-28 NOTE — Patient Instructions (Signed)
Your physician recommends that you schedule a follow-up appointment in: 12 months.  

## 2011-09-28 NOTE — Assessment & Plan Note (Signed)
He has significant carotid disease. It is being watched very careful with followup Dopplers.

## 2011-09-28 NOTE — Assessment & Plan Note (Signed)
Coronary disease is stable. His last nuclear scan was December, 2011. He does not need any testing at this time. He's not having any marked symptoms. Followup in one year.

## 2011-09-28 NOTE — Progress Notes (Signed)
HPI  Patient is seen for cardiology followup. I saw him one year ago when he was stable. His wife is totally dependent on him for all care and he is at home with her at all times. He is not able to get out the exercise putty exercises some in his home. He's not having any significant chest pain or shortness of breath.  As part of today's evaluation I have reviewed the old records. I have carefully updated the new electronic medical record.  Allergies  Allergen Reactions  . Rabeprazole Sodium     Current Outpatient Prescriptions  Medication Sig Dispense Refill  . Ascorbic Acid (VITAMIN C WITH ROSE HIPS) 1000 MG tablet Take 1,000 mg by mouth daily.      Marland Kitchen aspirin 81 MG tablet Take 81 mg by mouth daily.      . citalopram (CELEXA) 20 MG tablet Take 20 mg by mouth daily.       . clopidogrel (PLAVIX) 75 MG tablet Take 75 mg by mouth daily.       . fish oil-omega-3 fatty acids 1000 MG capsule Take 1 g by mouth daily.      . metoprolol (TOPROL-XL) 50 MG 24 hr tablet Take 50 mg by mouth daily. Half a tablet a day      . OMEPRAZOLE PO Take by mouth as needed.      . triamterene-hydrochlorothiazide (DYAZIDE) 37.5-25 MG per capsule Take 1 capsule by mouth daily.         History   Social History  . Marital Status: Married    Spouse Name: N/A    Number of Children: N/A  . Years of Education: N/A   Occupational History  . Not on file.   Social History Main Topics  . Smoking status: Never Smoker   . Smokeless tobacco: Not on file  . Alcohol Use: Not on file  . Drug Use: Not on file  . Sexually Active: Not on file   Other Topics Concern  . Not on file   Social History Narrative  . No narrative on file    No family history on file.  Past Medical History  Diagnosis Date  . Hiatal hernia   . Hypertension   . Dyslipidemia     statin intolerance  . Statin intolerance   . COPD (chronic obstructive pulmonary disease)   . Anxiety     mild  . GERD (gastroesophageal reflux disease)     . Chronic headaches   . Prostate cancer     TUR and implant  . CAD (coronary artery disease)     DES proximal circumflex January, 2009  /   nuclear, December, 2011, no ischemia, ejection fraction 70%  . Ejection fraction     EF 70%, nuclear, December, 2011  . Hx of CABG     [place], 2002  . Carotid artery disease     Doppler, August, 2011, zero 39% RIC A., 60-79% LICA, stable  . Rash     2009, etiology not clear, not from Plavix, Claritin helped    No past surgical history on file.  ROS   Patient denies fever, chills, headache, sweats, rash, change in vision, change in hearing, chest pain, cough, nausea vomiting, urinary symptoms. All other systems are reviewed and are negative.  PHYSICAL EXAM  Patient is oriented to person time and place. Affect is normal. Head is atraumatic. There is no xanthelasma. There is no jugulovenous distention. Lungs are clear. Respiratory effort is nonlabored. Cardiac exam  reveals S1 and S2. There no clicks or significant murmurs. The abdomen is soft. There is no peripheral edema. There are no musculoskeletal Deformities. There no skin rashes.  Filed Vitals:   09/28/11 1400  BP: 132/70  Pulse: 64  Height: 5' 9.5" (1.765 m)  Weight: 188 lb 1.9 oz (85.331 kg)    EKG  EKG is done today and reviewed by me. There is normal sinus rhythm. There is no significant change. There are diffuse nonspecific ST-T wave changes. This is unchanged from the prior tracing.  ASSESSMENT & PLAN

## 2012-05-31 ENCOUNTER — Other Ambulatory Visit: Payer: Self-pay | Admitting: Cardiology

## 2012-05-31 DIAGNOSIS — I6529 Occlusion and stenosis of unspecified carotid artery: Secondary | ICD-10-CM

## 2012-06-05 ENCOUNTER — Encounter (INDEPENDENT_AMBULATORY_CARE_PROVIDER_SITE_OTHER): Payer: Medicare Other

## 2012-06-05 DIAGNOSIS — I6529 Occlusion and stenosis of unspecified carotid artery: Secondary | ICD-10-CM

## 2012-10-07 ENCOUNTER — Ambulatory Visit (INDEPENDENT_AMBULATORY_CARE_PROVIDER_SITE_OTHER): Payer: Medicare Other | Admitting: Cardiology

## 2012-10-07 ENCOUNTER — Encounter: Payer: Self-pay | Admitting: Cardiology

## 2012-10-07 VITALS — BP 140/68 | HR 66 | Ht 69.5 in | Wt 193.4 lb

## 2012-10-07 DIAGNOSIS — K219 Gastro-esophageal reflux disease without esophagitis: Secondary | ICD-10-CM

## 2012-10-07 DIAGNOSIS — E785 Hyperlipidemia, unspecified: Secondary | ICD-10-CM

## 2012-10-07 DIAGNOSIS — I779 Disorder of arteries and arterioles, unspecified: Secondary | ICD-10-CM

## 2012-10-07 DIAGNOSIS — I2510 Atherosclerotic heart disease of native coronary artery without angina pectoris: Secondary | ICD-10-CM

## 2012-10-07 DIAGNOSIS — I10 Essential (primary) hypertension: Secondary | ICD-10-CM

## 2012-10-07 NOTE — Assessment & Plan Note (Signed)
The patient has significant carotid disease. It is being followed carefully. His last Doppler was September, 2013.

## 2012-10-07 NOTE — Progress Notes (Signed)
HPI  Patient is seen for followup of coronary disease. I saw him last January, 2013. He's doing very well. He's not having any symptoms. We are following his carotid disease. He is statin intolerant. His original symptom was chest burning. He does not have any.  Allergies  Allergen Reactions  . Rabeprazole Sodium     Current Outpatient Prescriptions  Medication Sig Dispense Refill  . Ascorbic Acid (VITAMIN C WITH ROSE HIPS) 1000 MG tablet Take 1,000 mg by mouth daily.      Marland Kitchen aspirin 81 MG tablet Take 81 mg by mouth daily.      . citalopram (CELEXA) 20 MG tablet Take 20 mg by mouth daily.       . clopidogrel (PLAVIX) 75 MG tablet Take 75 mg by mouth daily.       . fish oil-omega-3 fatty acids 1000 MG capsule Take 1 g by mouth daily.      . metoprolol (TOPROL-XL) 50 MG 24 hr tablet Take 50 mg by mouth daily. Half a tablet a day      . OMEPRAZOLE PO Take by mouth as needed.      . triamterene-hydrochlorothiazide (DYAZIDE) 37.5-25 MG per capsule Take 1 capsule by mouth daily.         History   Social History  . Marital Status: Married    Spouse Name: N/A    Number of Children: N/A  . Years of Education: N/A   Occupational History  . Not on file.   Social History Main Topics  . Smoking status: Never Smoker   . Smokeless tobacco: Not on file  . Alcohol Use: Not on file  . Drug Use: Not on file  . Sexually Active: Not on file   Other Topics Concern  . Not on file   Social History Narrative  . No narrative on file    No family history on file.  Past Medical History  Diagnosis Date  . Hiatal hernia   . Hypertension   . Dyslipidemia     statin intolerance  . Statin intolerance   . COPD (chronic obstructive pulmonary disease)   . Anxiety     mild  . GERD (gastroesophageal reflux disease)   . Chronic headaches   . Prostate cancer     TUR and implant  . CAD (coronary artery disease)     DES proximal circumflex January, 2009  /   nuclear, December, 2011, no  ischemia, ejection fraction 70%  . Ejection fraction     EF 70%, nuclear, December, 2011  . Hx of CABG     Hendrickson, 2002  . Carotid artery disease     Doppler, August, 2011, zero 39% RIC A., 60-79% LICA, stable  . Rash     2009, etiology not clear, not from Plavix, Claritin helped    No past surgical history on file.  Patient Active Problem List  Diagnosis  . HYPERLIPIDEMIA  . GERD  . CAD (coronary artery disease)  . Hypertension  . Dyslipidemia  . Statin intolerance  . COPD (chronic obstructive pulmonary disease)  . Anxiety  . GERD (gastroesophageal reflux disease)  . Chronic headaches  . Prostate cancer  . Ejection fraction  . Hx of CABG  . Carotid artery disease  . Rash    ROS   Patient denies fever, chills, headache, sweats, rash, change in vision, change in hearing, chest pain, cough, nausea vomiting, urinary symptoms. All other systems are reviewed and are negative.  PHYSICAL EXAM  Patient looks quite good for 79 years of age. He is oriented to person time and place. Affect is normal. There is no jugulovenous distention. Lungs are clear. Respiratory effort is nonlabored. Cardiac exam reveals S1 and S2. There no clicks or significant murmurs. The abdomen is soft. Is no peripheral edema. There are no musculoskeletal deformities. There are no skin rashes.  Filed Vitals:   10/07/12 1353  BP: 140/68  Pulse: 66  Height: 5' 9.5" (1.765 m)  Weight: 193 lb 6.4 oz (87.726 kg)   EKG is done today and reviewed by me. There is normal sinus rhythm. There are diffuse mild nonspecific ST-T wave changes. There is no change from the prior EKG.  ASSESSMENT & PLAN

## 2012-10-07 NOTE — Patient Instructions (Addendum)
Your physician has recommended you make the following change in your medication: STOP your Plavix (Clopidogrel).  Otherwise, continue on your current medications as directed. Please refer to the Current Medication list given to you today.  Your physician wants you to follow-up in: 1 year.  You will receive a reminder letter in the mail two months in advance. If you don't receive a letter, please call our office to schedule the follow-up appointment.

## 2012-10-07 NOTE — Assessment & Plan Note (Signed)
The patient has statin intolerance. We cannot change his meds at this time.

## 2012-10-07 NOTE — Assessment & Plan Note (Signed)
The patient received a drug-eluting stent in 2009. His other grafts were patent. In 2011 he had no ischemia on nuclear study. He is not having any significant symptoms. He has been on aspirin Plavix since his drug-eluting stent in January, 2009. There is no evidence now that further continuation of the Plavix he is necessary. His Plavix will be stopped. He does not need any further testing at this point.

## 2012-10-07 NOTE — Assessment & Plan Note (Signed)
Blood pressure is stable. No change in therapy. 

## 2012-10-07 NOTE — Assessment & Plan Note (Signed)
He has some mild GERD symptoms but this is stable.

## 2012-12-31 ENCOUNTER — Encounter (INDEPENDENT_AMBULATORY_CARE_PROVIDER_SITE_OTHER): Payer: Medicare Other

## 2012-12-31 DIAGNOSIS — I6529 Occlusion and stenosis of unspecified carotid artery: Secondary | ICD-10-CM

## 2013-01-04 ENCOUNTER — Encounter: Payer: Self-pay | Admitting: Cardiology

## 2013-07-01 ENCOUNTER — Encounter (HOSPITAL_COMMUNITY): Payer: Medicare Other

## 2013-07-02 ENCOUNTER — Encounter (HOSPITAL_COMMUNITY): Payer: Medicare Other

## 2013-07-03 ENCOUNTER — Ambulatory Visit (HOSPITAL_COMMUNITY): Payer: Medicare Other | Attending: Cardiology

## 2013-07-03 DIAGNOSIS — I658 Occlusion and stenosis of other precerebral arteries: Secondary | ICD-10-CM | POA: Insufficient documentation

## 2013-07-03 DIAGNOSIS — I6529 Occlusion and stenosis of unspecified carotid artery: Secondary | ICD-10-CM | POA: Insufficient documentation

## 2013-07-05 ENCOUNTER — Encounter: Payer: Self-pay | Admitting: Cardiology

## 2013-09-29 ENCOUNTER — Encounter: Payer: Self-pay | Admitting: Cardiology

## 2013-09-29 ENCOUNTER — Ambulatory Visit (INDEPENDENT_AMBULATORY_CARE_PROVIDER_SITE_OTHER): Payer: Medicare Other | Admitting: Cardiology

## 2013-09-29 VITALS — BP 138/84 | HR 78 | Ht 69.5 in | Wt 192.8 lb

## 2013-09-29 DIAGNOSIS — I4891 Unspecified atrial fibrillation: Secondary | ICD-10-CM | POA: Insufficient documentation

## 2013-09-29 DIAGNOSIS — I10 Essential (primary) hypertension: Secondary | ICD-10-CM

## 2013-09-29 DIAGNOSIS — E785 Hyperlipidemia, unspecified: Secondary | ICD-10-CM

## 2013-09-29 DIAGNOSIS — I2510 Atherosclerotic heart disease of native coronary artery without angina pectoris: Secondary | ICD-10-CM

## 2013-09-29 DIAGNOSIS — I739 Peripheral vascular disease, unspecified: Secondary | ICD-10-CM

## 2013-09-29 DIAGNOSIS — I779 Disorder of arteries and arterioles, unspecified: Secondary | ICD-10-CM

## 2013-09-29 NOTE — Assessment & Plan Note (Signed)
Patient has significant carotid disease. However it is stable and being watched carefully.

## 2013-09-29 NOTE — Assessment & Plan Note (Signed)
Blood pressure is controlled. No change in therapy. 

## 2013-09-29 NOTE — Patient Instructions (Addendum)
Your physician recommends that you continue on your current medications as directed. Please refer to the Current Medication list given to you today.  Your physician recommends that you schedule a follow-up appointment in: 10/20/13 at 2:30  Your physician has requested that you have an echocardiogram. Echocardiography is a painless test that uses sound waves to create images of your heart. It provides your doctor with information about the size and shape of your heart and how well your heart's chambers and valves are working. This procedure takes approximately one hour. There are no restrictions for this procedure.

## 2013-09-29 NOTE — Assessment & Plan Note (Signed)
Today there is a new diagnosis of atrial fibrillation. I had a very extensive discussion with the patient about this. I explained to him that I thought that we would not try to convert him back to sinus rhythm. He is not having any significant symptoms. We do not know how long he has been in atrial fibrillation. His rate is controlled. I do not need to adjust any medications for this. I have recommended that he be anticoagulated. His wife is dying of other medical problems and recently had hematuria while on Coumadin. He is very hesitant to be anticoagulated. He definitely will not consider Coumadin. He may consider one of the newer agents. In his case I will use Eliquis if he agrees. We had a very good discussion. He is hesitant but he is considering. I will see him back in several weeks to reassess the situation. Two-dimensional echo will be done to reassess LV function and valvular function as this has not been done in many years.  Part of today's evaluation I spent greater than 25 minutes with a total care. More than half of this time was spent with the rectus discussion. We had an extensive discussion about atrial fibrillation.

## 2013-09-29 NOTE — Assessment & Plan Note (Signed)
Coronary disease is stable. He had a nuclear study in 2011. There was no ischemia. Ejection fraction was normal.

## 2013-09-29 NOTE — Progress Notes (Signed)
HPI  Patient is seen today for followup coronary disease. He stable. I saw him last January, 2014. Since that time he had a carotid Doppler in October, 2014. He has moderately significant disease but it is stable. He underwent CABG in 2002. He has had followup studies showing no marked ischemia. He's not having any significant chest pain. He notices that sometimes at night he feels his heart rate increased. This does not appear to limit him during the day. He is not feeling any significant palpitations.  Allergies  Allergen Reactions  . Rabeprazole Sodium     Current Outpatient Prescriptions  Medication Sig Dispense Refill  . Ascorbic Acid (VITAMIN C WITH ROSE HIPS) 1000 MG tablet Take 1,000 mg by mouth daily.      Marland Kitchen aspirin 81 MG tablet Take 81 mg by mouth daily.      . fish oil-omega-3 fatty acids 1000 MG capsule Take 1 g by mouth daily.      . lansoprazole (PREVACID) 30 MG capsule Take 1 capsule by mouth daily.      . metoprolol (TOPROL-XL) 50 MG 24 hr tablet Take 50 mg by mouth daily. Pt taking half a tablet a day      . omeprazole (PRILOSEC) 40 MG capsule Take 40 mg by mouth daily.      Marland Kitchen triamterene-hydrochlorothiazide (DYAZIDE) 37.5-25 MG per capsule Take 1 capsule by mouth daily.        No current facility-administered medications for this visit.    History   Social History  . Marital Status: Married    Spouse Name: N/A    Number of Children: N/A  . Years of Education: N/A   Occupational History  . Not on file.   Social History Main Topics  . Smoking status: Never Smoker   . Smokeless tobacco: Not on file  . Alcohol Use: Not on file  . Drug Use: Not on file  . Sexual Activity: Not on file   Other Topics Concern  . Not on file   Social History Narrative  . No narrative on file    No family history on file.  Past Medical History  Diagnosis Date  . Hiatal hernia   . Hypertension   . Dyslipidemia     statin intolerance  . Statin intolerance   . COPD  (chronic obstructive pulmonary disease)   . Anxiety     mild  . GERD (gastroesophageal reflux disease)   . Chronic headaches   . Prostate cancer     TUR and implant  . CAD (coronary artery disease)     DES proximal circumflex January, 2009  /   nuclear, December, 2011, no ischemia, ejection fraction 70%  . Ejection fraction     EF 70%, nuclear, December, 2011  . Hx of CABG     Hendrickson, 2002  . Carotid artery disease     Doppler, August, 2011, zero 40% RIC A., 98-11% LICA, stable  . Rash     2009, etiology not clear, not from Plavix, Claritin helped  . Atrial fibrillation     History reviewed. No pertinent past surgical history.  Patient Active Problem List   Diagnosis Date Noted  . Atrial fibrillation   . CAD (coronary artery disease)   . Hypertension   . Dyslipidemia   . Statin intolerance   . COPD (chronic obstructive pulmonary disease)   . Anxiety   . GERD (gastroesophageal reflux disease)   . Chronic headaches   . Prostate  cancer   . Ejection fraction   . Hx of CABG   . Carotid artery disease   . Rash   . HYPERLIPIDEMIA 01/20/2009  . GERD 01/20/2009    ROS   Patient denies fever, chills, headache, sweats, rash, change in vision, change in hearing, chest pain, cough, nausea vomiting, urinary symptoms. All other systems are reviewed and are negative.  PHYSICAL EXAM  Patient is oriented to person time and place. Affect is normal. There is no jugulovenous distention. Lungs are clear. Respiratory effort is nonlabored. Cardiac exam reveals S1 and S2. The rhythm is irregularly irregular. The abdomen is soft. There is no peripheral edema. There no musculoskeletal deformities. There are no skin rashes.  Filed Vitals:   09/29/13 1550  BP: 138/84  Pulse: 78  Height: 5' 9.5" (1.765 m)  Weight: 192 lb 12.8 oz (87.454 kg)   EKG is done today and reviewed by me. The patient appears to have coarse atrial fibrillation. The rate is controlled. There is no change in the  QRS.  ASSESSMENT & PLAN

## 2013-09-29 NOTE — Assessment & Plan Note (Signed)
Patient has statin intolerance. No change in therapy.

## 2013-10-16 ENCOUNTER — Ambulatory Visit (HOSPITAL_COMMUNITY): Payer: Medicare Other | Attending: Cardiology | Admitting: Radiology

## 2013-10-16 DIAGNOSIS — E785 Hyperlipidemia, unspecified: Secondary | ICD-10-CM | POA: Insufficient documentation

## 2013-10-16 DIAGNOSIS — I079 Rheumatic tricuspid valve disease, unspecified: Secondary | ICD-10-CM | POA: Insufficient documentation

## 2013-10-16 DIAGNOSIS — J4489 Other specified chronic obstructive pulmonary disease: Secondary | ICD-10-CM | POA: Insufficient documentation

## 2013-10-16 DIAGNOSIS — I2510 Atherosclerotic heart disease of native coronary artery without angina pectoris: Secondary | ICD-10-CM

## 2013-10-16 DIAGNOSIS — Z951 Presence of aortocoronary bypass graft: Secondary | ICD-10-CM

## 2013-10-16 DIAGNOSIS — I10 Essential (primary) hypertension: Secondary | ICD-10-CM | POA: Insufficient documentation

## 2013-10-16 DIAGNOSIS — I4891 Unspecified atrial fibrillation: Secondary | ICD-10-CM | POA: Insufficient documentation

## 2013-10-16 DIAGNOSIS — J449 Chronic obstructive pulmonary disease, unspecified: Secondary | ICD-10-CM | POA: Insufficient documentation

## 2013-10-16 DIAGNOSIS — I059 Rheumatic mitral valve disease, unspecified: Secondary | ICD-10-CM | POA: Insufficient documentation

## 2013-10-16 NOTE — Progress Notes (Signed)
Echocardiogram performed.  

## 2013-10-17 ENCOUNTER — Encounter: Payer: Self-pay | Admitting: Cardiology

## 2013-10-20 ENCOUNTER — Encounter: Payer: Self-pay | Admitting: Cardiology

## 2013-10-20 ENCOUNTER — Ambulatory Visit (INDEPENDENT_AMBULATORY_CARE_PROVIDER_SITE_OTHER): Payer: Medicare Other | Admitting: Cardiology

## 2013-10-20 VITALS — BP 120/70 | HR 64 | Ht 69.5 in | Wt 191.0 lb

## 2013-10-20 DIAGNOSIS — R7309 Other abnormal glucose: Secondary | ICD-10-CM

## 2013-10-20 DIAGNOSIS — I2510 Atherosclerotic heart disease of native coronary artery without angina pectoris: Secondary | ICD-10-CM

## 2013-10-20 DIAGNOSIS — Z789 Other specified health status: Secondary | ICD-10-CM

## 2013-10-20 DIAGNOSIS — R739 Hyperglycemia, unspecified: Secondary | ICD-10-CM

## 2013-10-20 DIAGNOSIS — I10 Essential (primary) hypertension: Secondary | ICD-10-CM

## 2013-10-20 DIAGNOSIS — I4891 Unspecified atrial fibrillation: Secondary | ICD-10-CM

## 2013-10-20 DIAGNOSIS — D696 Thrombocytopenia, unspecified: Secondary | ICD-10-CM

## 2013-10-20 LAB — CBC WITH DIFFERENTIAL/PLATELET
Basophils Absolute: 0 10*3/uL (ref 0.0–0.1)
Basophils Relative: 0.2 % (ref 0.0–3.0)
Eosinophils Absolute: 0.1 10*3/uL (ref 0.0–0.7)
Eosinophils Relative: 2 % (ref 0.0–5.0)
HCT: 40 % (ref 39.0–52.0)
Hemoglobin: 13.4 g/dL (ref 13.0–17.0)
Lymphocytes Relative: 19.5 % (ref 12.0–46.0)
Lymphs Abs: 1 10*3/uL (ref 0.7–4.0)
MCHC: 33.6 g/dL (ref 30.0–36.0)
MCV: 97.1 fl (ref 78.0–100.0)
Monocytes Absolute: 0.4 10*3/uL (ref 0.1–1.0)
Monocytes Relative: 8.3 % (ref 3.0–12.0)
Neutro Abs: 3.6 10*3/uL (ref 1.4–7.7)
Neutrophils Relative %: 70 % (ref 43.0–77.0)
Platelets: 109 10*3/uL — ABNORMAL LOW (ref 150.0–400.0)
RBC: 4.12 Mil/uL — ABNORMAL LOW (ref 4.22–5.81)
RDW: 13.1 % (ref 11.5–14.6)
WBC: 5.2 10*3/uL (ref 4.5–10.5)

## 2013-10-20 LAB — BASIC METABOLIC PANEL
BUN: 20 mg/dL (ref 6–23)
CO2: 27 mEq/L (ref 19–32)
Calcium: 8.9 mg/dL (ref 8.4–10.5)
Chloride: 97 mEq/L (ref 96–112)
Creatinine, Ser: 1.3 mg/dL (ref 0.4–1.5)
GFR: 58.23 mL/min — ABNORMAL LOW (ref >60.00)
Glucose, Bld: 378 mg/dL — ABNORMAL HIGH (ref 70–99)
Potassium: 4.7 mEq/L (ref 3.5–5.1)
Sodium: 131 mEq/L — ABNORMAL LOW (ref 135–145)

## 2013-10-20 NOTE — Patient Instructions (Addendum)
Your physician recommends that you return for lab work in: today  We will contact you concerning starting Eliquis, after Dr Ron Parker reviews your lab results, and your follow up appointment with Dr Ron Parker.

## 2013-10-21 ENCOUNTER — Encounter: Payer: Self-pay | Admitting: Cardiology

## 2013-10-21 ENCOUNTER — Telehealth: Payer: Self-pay

## 2013-10-21 DIAGNOSIS — D696 Thrombocytopenia, unspecified: Secondary | ICD-10-CM | POA: Insufficient documentation

## 2013-10-21 DIAGNOSIS — R739 Hyperglycemia, unspecified: Secondary | ICD-10-CM | POA: Insufficient documentation

## 2013-10-21 NOTE — Telephone Encounter (Signed)
**Note De-Identified William Weber Obfuscation** The pt is advised of his elevated Glucose level and low platelet count and that we will not be starting him on Eliquis at this time. The pt states that his PCP is Dr Kathryne Eriksson. I offered to call Dr Dois Davenport office to schedule the pt a f/u but the pt requested that he call himself. The pt is advised that Dr Ron Parker already faxed his OV notes to Dr Redmond Pulling and that I have faxed his lab results as well. The pt verbalized understanding.

## 2013-10-21 NOTE — Assessment & Plan Note (Addendum)
His unexpected glucose of 350 on a random sample today in the office is a new finding. We will ask the patient to follow up with his primary physician soon about this.  As part of today's evaluation I spent greater than 25 minutes with his overall care. More than half of 25 minutes was spent with direct contact with the patient. We have continued to discuss the pros and cons of anticoagulation at great length. I also spent time talking with him about the loss of his wife.

## 2013-10-21 NOTE — Telephone Encounter (Signed)
**Note De-Identified Uzoma Vivona Obfuscation** Tammy, a receptionist at Dr Dois Davenport office, is advised that I have faxed over the pts lab results and I asked if the pt has scheduled a f/u with Dr Redmond Pulling and I was advised that he had called them and he is scheduled to see Dr Redmond Pulling on 2/11.

## 2013-10-21 NOTE — Assessment & Plan Note (Signed)
This reduce platelet count is new since lab many years ago. We will look into further assessment before deciding about his anticoagulation.

## 2013-10-21 NOTE — Assessment & Plan Note (Signed)
Coronary disease is stable. He has a normal ejection fraction by history and recent echo. Nuclear study in December, 2011 revealed no ischemia. No further workup is needed.

## 2013-10-21 NOTE — Assessment & Plan Note (Signed)
He has atrial fibrillation of unknown duration. Rate is controlled and he is asymptomatic. He is not interested in pushing for rhythm control at this point. He needs to be anticoagulated regardless of whether we leave him in atrial fib or try to convert him. I had intended to order TSH but failed to do so. After our discussion today the plan had been to check his labs and probably start him on Eliquis, with plans to stop his aspirin. However his labs show a platelet count of 109,000. This is not an absolute contraindication. However it is a change from labs done several years ago. Also, this patient has been very hesitant to consider anticoagulation. It will be most prudent to be sure that all potential factors that could affect his bleeding risk are carefully assessed before starting anticoagulation. I will be in touch with the patient and asked that he see his primary physician and look into the assessment of his platelet count. In addition his glucose is unexpectedly 350 on a random sample in the office. This also will need some further evaluation.

## 2013-10-21 NOTE — Assessment & Plan Note (Signed)
Blood pressures control. No change in therapy. 

## 2013-10-21 NOTE — Progress Notes (Signed)
HPI   Patient is seen today to followup his atrial fibrillation. His wife has passed away since I saw him last on September 29, 2013. 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 He is sad and to have lost her. However he is glad she's not suffering any longer.  He is a healthy 80 year old gentleman. He did undergo bypass surgery in 2002. He has no obvious ongoing ischemia. When I saw him for routine followup on September 29, 2013, his EKG revealed atrial fibrillation. He was completely asymptomatic. The rate was controlled. We do not know how long he had been in atrial fibrillation. Hypoxic with him about anticoagulation. He flatly refuses Coumadin. He was very hesitant about any of the new agents. He felt that these may have caused difficulties for his wife. We decided to do a 2-D echo to reassess and then plan followup. The echo was done October 16, 2013. Ejection fraction was 55-60%. There was mild mitral regurgitation.  I'm seeing him back today to discuss anticoagulation further. He and I had a long discussion in the office. He is now willing to consider anticoagulation. I told him that I wanted to check his baseline labs first. I also told him that I would want to limit his risk of bleeding by planning to stop his aspirin when Eliquis was started. Plan was made to obtain labs before the next. Lab results are already back today. His renal function is good. However random glucose was 350. In addition random platelet count was reduced to 109,000. This was lower than values from the past. It is of note that he has never had any significant bleeding. However he does get some ecchymoses from his aspirin.  Allergies  Allergen Reactions  . Rabeprazole Sodium     Current Outpatient Prescriptions  Medication Sig Dispense Refill  . Ascorbic Acid (VITAMIN C WITH ROSE HIPS) 1000 MG tablet Take 1,000 mg by mouth daily.      Marland Kitchen aspirin 81 MG  tablet Take 81 mg by mouth daily.      . fish oil-omega-3 fatty acids 1000 MG capsule Take 1 g by mouth daily.      . lansoprazole (PREVACID) 30 MG capsule Take 30 mg by mouth daily at 12 noon.      . metoprolol (TOPROL-XL) 50 MG 24 hr tablet Take 50 mg by mouth daily. Pt taking half a tablet a day      . triamterene-hydrochlorothiazide (DYAZIDE) 37.5-25 MG per capsule Take 1 capsule by mouth daily.        No current facility-administered medications for this visit.    History   Social History  . Marital Status: Married    Spouse Name: N/A    Number of Children: N/A  . Years of Education: N/A   Occupational History  . Not on file.   Social History Main Topics  . Smoking status: Never Smoker   . Smokeless tobacco: Not on file  . Alcohol Use: Not on file  . Drug Use: Not on file  . Sexual Activity: Not on file   Other Topics Concern  . Not on file   Social History Narrative  . No narrative on file    No family history on file.  Past Medical History  Diagnosis Date  . Hiatal hernia   . Hypertension   . Dyslipidemia     statin intolerance  .  Statin intolerance   . COPD (chronic obstructive pulmonary disease)   . Anxiety     mild  . GERD (gastroesophageal reflux disease)   . Chronic headaches   . Prostate cancer     TUR and implant  . CAD (coronary artery disease)     DES proximal circumflex January, 2009  /   nuclear, December, 2011, no ischemia, ejection fraction 70%  . Ejection fraction     EF 70%, nuclear, December, 2011  . Hx of CABG     Hendrickson, 2002  . Carotid artery disease     Doppler, August, 2011, zero 10% RIC A., 25-85% LICA, stable  . Rash     2009, etiology not clear, not from Plavix, Claritin helped  . Atrial fibrillation     History reviewed. No pertinent past surgical history.  Patient Active Problem List   Diagnosis Date Noted  . Thrombocytopenia 10/21/2013  . Elevated serum glucose 10/21/2013  . Atrial fibrillation   . CAD  (coronary artery disease)   . Hypertension   . Dyslipidemia   . Statin intolerance   . COPD (chronic obstructive pulmonary disease)   . Anxiety   . GERD (gastroesophageal reflux disease)   . Chronic headaches   . Prostate cancer   . Ejection fraction   . Hx of CABG   . Carotid artery disease   . Rash     ROS   Patient denies fever, chills, headache, sweats, rash, change in vision, change in hearing, chest pain, cough, nausea vomiting, urinary symptoms. All other systems are reviewed and are negative.  PHYSICAL EXAM  Patient is oriented to person time and place. Affect is normal. He is sad and over the loss of his wife. There is no jugulovenous distention. Lungs are clear. Respiratory effort is nonlabored. Cardiac exam reveals an S1 and S2. There no clicks or significant murmurs. The abdomen is soft. There is no peripheral edema. There no musculoskeletal deformities. There are no skin rashes. There are a few small areas of ecchymoses in his hands related to his aspirin therapy.. his rhythm was irregularly irregular.  Filed Vitals:   10/20/13 1425  BP: 120/70  Pulse: 64  Height: 5' 9.5" (1.765 m)  Weight: 191 lb (86.637 kg)   EKG was not done today. However his rhythm was irregularly irregular on physical exam. The rate was controlled. ASSESSMENT & PLAN

## 2013-10-21 NOTE — Telephone Encounter (Signed)
**Note De-Identified William Weber Obfuscation** LMTCB At yesterday's OV with Dr Ron Parker the pt was advised that we would contact him after we received the results of his BMET and CBC to let him know if he could start taking Eliquis.  Per Dr Ron Parker the pt cannot start taking Eliquis because his Platelet count is low at 109.0 and his Glucose level is elevated at 378. The pt will need to see his PCP for these results. We need to ask the pt who his PCP is and call that office to try to get the pt seen soon.

## 2013-10-21 NOTE — Assessment & Plan Note (Signed)
Historically he has statin intolerance. No change in therapy.

## 2014-06-17 ENCOUNTER — Other Ambulatory Visit (HOSPITAL_COMMUNITY): Payer: Self-pay | Admitting: *Deleted

## 2014-06-17 DIAGNOSIS — I6529 Occlusion and stenosis of unspecified carotid artery: Secondary | ICD-10-CM

## 2014-07-07 ENCOUNTER — Ambulatory Visit (HOSPITAL_COMMUNITY): Payer: Medicare Other | Attending: Cardiovascular Disease | Admitting: *Deleted

## 2014-07-07 DIAGNOSIS — I6523 Occlusion and stenosis of bilateral carotid arteries: Secondary | ICD-10-CM

## 2014-07-07 DIAGNOSIS — E785 Hyperlipidemia, unspecified: Secondary | ICD-10-CM | POA: Insufficient documentation

## 2014-07-07 DIAGNOSIS — J449 Chronic obstructive pulmonary disease, unspecified: Secondary | ICD-10-CM | POA: Diagnosis not present

## 2014-07-07 DIAGNOSIS — I10 Essential (primary) hypertension: Secondary | ICD-10-CM | POA: Insufficient documentation

## 2014-07-07 DIAGNOSIS — I2510 Atherosclerotic heart disease of native coronary artery without angina pectoris: Secondary | ICD-10-CM | POA: Diagnosis not present

## 2014-07-07 DIAGNOSIS — Z951 Presence of aortocoronary bypass graft: Secondary | ICD-10-CM | POA: Diagnosis not present

## 2014-07-07 NOTE — Progress Notes (Signed)
Carotid Duplex Performed 

## 2014-07-08 ENCOUNTER — Encounter: Payer: Self-pay | Admitting: Cardiology

## 2014-12-28 ENCOUNTER — Other Ambulatory Visit (HOSPITAL_COMMUNITY): Payer: Self-pay | Admitting: Cardiology

## 2014-12-28 DIAGNOSIS — I6523 Occlusion and stenosis of bilateral carotid arteries: Secondary | ICD-10-CM

## 2015-01-07 ENCOUNTER — Ambulatory Visit (HOSPITAL_COMMUNITY): Payer: Medicare Other | Attending: Cardiology | Admitting: Cardiology

## 2015-01-07 DIAGNOSIS — I10 Essential (primary) hypertension: Secondary | ICD-10-CM | POA: Diagnosis not present

## 2015-01-07 DIAGNOSIS — I6523 Occlusion and stenosis of bilateral carotid arteries: Secondary | ICD-10-CM

## 2015-01-07 DIAGNOSIS — E785 Hyperlipidemia, unspecified: Secondary | ICD-10-CM | POA: Insufficient documentation

## 2015-01-07 DIAGNOSIS — R0989 Other specified symptoms and signs involving the circulatory and respiratory systems: Secondary | ICD-10-CM | POA: Insufficient documentation

## 2015-01-07 DIAGNOSIS — J449 Chronic obstructive pulmonary disease, unspecified: Secondary | ICD-10-CM | POA: Diagnosis not present

## 2015-01-07 DIAGNOSIS — I2510 Atherosclerotic heart disease of native coronary artery without angina pectoris: Secondary | ICD-10-CM | POA: Insufficient documentation

## 2015-01-07 DIAGNOSIS — Z951 Presence of aortocoronary bypass graft: Secondary | ICD-10-CM | POA: Diagnosis not present

## 2015-01-07 NOTE — Progress Notes (Signed)
Carotid duplex performed 

## 2016-07-24 ENCOUNTER — Ambulatory Visit: Payer: Medicare Other | Admitting: Podiatry

## 2017-10-02 ENCOUNTER — Telehealth: Payer: Self-pay

## 2017-10-02 NOTE — Telephone Encounter (Signed)
SENT REFERRAL TO SCHEDULING 

## 2017-11-30 ENCOUNTER — Encounter: Payer: Self-pay | Admitting: Cardiovascular Disease

## 2017-12-09 NOTE — Progress Notes (Signed)
Chief Complaint  Patient presents with  . New Patient (Initial Visit)   History of Present Illness: 84 yo male with history of CAD s/p CABG, atrial fibrillation, HTN, HLD, COPD and statin intolerance here today to re-establish cardiac care. He had been followed by Dr. Ron Parker but has not been seen in our office since February 2015. He had a 5 vessel CABG in 2002 (LIMA to LAD, SVG to D1, sequential SVG to PDA and PLA, SVG to OM1)and a drug eluting stent placed in the Circumflex in 2009. 4 bypass grafts were noted to be patent by cath in 2009. The vein graft to the PDA did not supply the PLA as reported in the op report. He is known to have PAF but has refused anti-coagulation in the past. He has been on ASA only.   He tells me today that he has done well since the passing of his wife 4 years ago. He has been sad but has occupied his days working on his cars. He is not willing to consider a blood thinner at this point after seeing issues that his wife had with blood thinners. He has no palpitations. He has rare, mild chest pains with heavy exertion but this quickly resolves with rest. He put out 130 bags of mulch by himself this week. No chest pain with this. No dyspnea, lower extremity edema, orthopnea, PND, dizziness, near syncope or syncope.    Primary Care Physician: Christain Sacramento, MD  Past Medical History:  Diagnosis Date  . Anxiety    mild  . Atrial fibrillation ([place])   . CAD (coronary artery disease)    DES proximal circumflex January, 2009  /   nuclear, December, 2011, no ischemia, ejection fraction 70%  . Carotid artery disease ([place])    Doppler, August, 2011, zero 40% RIC A., 81-44% LICA, stable  . Chronic headaches   . COPD (chronic obstructive pulmonary disease) ([place])   . Dyslipidemia    statin intolerance  . Ejection fraction    EF 70%, nuclear, December, 2011  . GERD (gastroesophageal reflux disease)   . Hiatal hernia   . Hx of CABG    Hendrickson, 2002  .  Hypercholesterolemia   . Hypertension   . Prostate cancer ([place])    TUR and implant  . Rash    2009, etiology not clear, not from Plavix, Claritin helped  . Statin intolerance     Past Surgical History:  Procedure Laterality Date  . BLADDER SURGERY    . CARDIAC SURGERY    . PROSTATE SURGERY      Current Outpatient Medications  Medication Sig Dispense Refill  . Ascorbic Acid (VITAMIN C WITH ROSE HIPS) 1000 MG tablet Take 1,000 mg by mouth daily.    Marland Kitchen aspirin 81 MG tablet Take 81 mg by mouth daily.    . DiphenhydrAMINE HCl (BENADRYL ALLERGY PO) Take by mouth at bedtime.    . fish oil-omega-3 fatty acids 1000 MG capsule Take 1 g by mouth daily.    . fluorouracil (EFUDEX) 5 % cream Apply 1 application topically 2 (two) times daily.    . lansoprazole (PREVACID) 30 MG capsule Take 30 mg by mouth daily at 12 noon.    . metoprolol (TOPROL-XL) 50 MG 24 hr tablet Take 50 mg by mouth daily. Pt taking half a tablet a day    . triamterene-hydrochlorothiazide (DYAZIDE) 37.5-25 MG per capsule Take 1 capsule by mouth daily.      No current facility-administered medications  for this visit.     Allergies  Allergen Reactions  . Metformin And Related Diarrhea    GAS  . Percocet [Oxycodone-Acetaminophen]   . Rabeprazole Sodium   . Statins     Hmg-Coa Reductase inhibitors, Myalgias (intolerance)    Social History   Socioeconomic History  . Marital status: Married    Spouse name: Not on file  . Number of children: Not on file  . Years of education: Not on file  . Highest education level: Not on file  Occupational History  . Not on file  Social Needs  . Financial resource strain: Not on file  . Food insecurity:    Worry: Not on file    Inability: Not on file  . Transportation needs:    Medical: Not on file    Non-medical: Not on file  Tobacco Use  . Smoking status: Never Smoker  . Smokeless tobacco: Never Used  Substance and Sexual Activity  . Alcohol use: No    Frequency: Never   . Drug use: No  . Sexual activity: Not on file  Lifestyle  . Physical activity:    Days per week: Not on file    Minutes per session: Not on file  . Stress: Not on file  Relationships  . Social connections:    Talks on phone: Not on file    Gets together: Not on file    Attends religious service: Not on file    Active member of club or organization: Not on file    Attends meetings of clubs or organizations: Not on file    Relationship status: Not on file  . Intimate partner violence:    Fear of current or ex partner: Not on file    Emotionally abused: Not on file    Physically abused: Not on file    Forced sexual activity: Not on file  Other Topics Concern  . Not on file  Social History Narrative  . Not on file    Family History  Problem Relation Age of Onset  . Diabetes Sister   . Diabetes Brother     Review of Systems:  As stated in the HPI and otherwise negative.   BP 118/70   Pulse 79   Ht 5' 9.5" (1.765 m)   Wt 183 lb (83 kg)   SpO2 93%   BMI 26.64 kg/m   Physical Examination: General: Well developed, well nourished, NAD  HEENT: OP clear, mucus membranes moist  SKIN: warm, dry. No rashes. Neuro: No focal deficits  Musculoskeletal: Muscle strength 5/5 all ext  Psychiatric: Mood and affect normal  Neck: No JVD, no carotid bruits, no thyromegaly, no lymphadenopathy.  Lungs:Clear bilaterally, no wheezes, rhonci, crackles Cardiovascular: Irreg irreg. No murmurs, gallops or rubs. Abdomen:Soft. Bowel sounds present. Non-tender.  Extremities: No lower extremity edema. Pulses are 2 + in the bilateral DP/PT.  Cardiac cath January 2009:   RESULTS:  Left main coronary artery:  The left main coronary artery was  free of significant disease.   Left anterior descending artery:  The left anterior descending artery  gave rise to 2 diagonal branches and then there was competing flow  distally.  There was a 50% narrowing in the proximal LAD and there was  an 80%  ostial narrowing in the first diagonal branch.   Circumflex artery:  The circumflex artery gave rise to a ramus branch, a  marginal branch and 2 posterolateral branches.  There was 80% stenosis  in the ramus  branch.  The marginal branch was completely occluded.  There was a complex lesion in the proximal circumflex artery which  extended over about 15 mm right after a sharp bend in the circumflex  artery.  There was also an 80% lesion at the ostium of the first of the  posterolateral branches.   Right coronary artery:  The right coronary artery was a moderate-sized  vessel and gave rise to a right ventricular branch, a posterior  descending branch and a posterolateral branch.  There was a 50% ostial  stenosis and a long 50% stenosis in the midportion of the vessel.  There  was a 90% stenosis in the  posterolateral branch and an 80% stenosis in  the posterolateral branch.  There was competing flow in the  posterolateral branch.   The saphenous vein graft to the posterior descending branch of the right  coronary was patent and functioned normally.  The previous report said  there was a graft to the posterior descending branch as well but we were  not able to identify that there was a connection on our angiogram today.   The saphenous vein graft to the marginal branch of the circumflex artery  was patent with a 20% narrowing in the proximal portion.   The saphenous vein graft to the diagonal branch of the LAD was patent  with luminal irregularities.   The LIMA graft to the LAD was patent and functioned normally.   Left ventriculogram:  Left ventriculogram performed in the RAO  projection showed good wall motion with no areas of hypokinesis.  The  estimated ejection fraction was 60%.   Following stenting of the lesion in the circumflex artery, stenosis  improved from 95% to 0%.   CONCLUSION:  1. Coronary artery disease status post prior coronary bypass graft      surgery  2002.  2. Severe native vessel disease with 50% stenosis in the proximal LAD,      80% ostial stenosis in the first large diagonal branch, 80%      stenosis in a small ramus branch of the circumflex artery, total      occlusion of the marginal branch of the circumflex artery, 95%      stenosis in the proximal circumflex artery, a 50% ostial and 50%      mid stenosis in the right coronary artery with 90 and 80% stenosis      in the large posterolateral branch.  3. Patent vein graft to the posterolateral branch of the right      coronary artery, patent vein graft to the marginal branch of the      circumflex artery with 20% proximal stenosis, patent vein graft to      the diagonal branch of the LAD, and patent LIMA graft to the LAD.  4. Normal left ventricular function with estimated ejection fraction      of 60%.  5. Successful percutaneous coronary intervention of the lesion in the      proximal native circumflex artery using a Promus drug-eluting stent      with improvement of the __________  narrowing from 95% to 0%.  EKG:  EKG is ordered today. The ekg ordered today demonstrates Atrial fibrillation, rate 79 bpm. T wave inversions inferior and lateral leads, unchanged from EKG in 2015.   Recent Labs: No results found for requested labs within last 8760 hours.   Lipid Panel Followed in primary care   Wt Readings from Last 3 Encounters:  12/10/17  183 lb (83 kg)  10/20/13 191 lb (86.6 kg)  09/29/13 192 lb 12.8 oz (87.5 kg)     Other studies Reviewed: Additional studies/ records that were reviewed today include: . Review of the above records demonstrates:    Assessment and Plan:   1. CAD s/p CABG with angina: He has mild chest pain with heavy exertion. Resolves quickly. C/w stable angina. He is on an ASA and a beta blocker. He refuses to consider a statin due to previous intolerance with multiple statins.   2. Paroxysmal atrial fibrillation: He is in atrial fibrillation today.  We have had a long discussion today regarding his stroke risk. His CHADS VASC score is 3. Rate is controlled on Toprol. I have offered to start a DOAC today but he refuses. We will discuss at the next visit.   3. HTN: BP is well controlled. No changes  4. HLD: He statin intolerant. I have discussed Repatha or Praluent injections. He will consider. I will check fasting lipids today.   Current medicines are reviewed at length with the patient today.  The patient does not have concerns regarding medicines.  The following changes have been made:  no change  Labs/ tests ordered today include:   Orders Placed This Encounter  Procedures  . Lipid Profile  . EKG 12-Lead     Disposition:   FU with me in 3 months to discuss a DOAC again and Repatha/Praluent.    Signed, Lauree Chandler, MD 12/10/2017 9:51 AM    [place] [address], [place], [place]  [address] Phone: [phone]; Fax: [phone]

## 2017-12-10 ENCOUNTER — Encounter: Payer: Self-pay | Admitting: Cardiovascular Disease

## 2017-12-10 ENCOUNTER — Ambulatory Visit: Payer: Medicare Other | Admitting: Cardiovascular Disease

## 2017-12-10 VITALS — BP 118/70 | HR 79 | Ht 69.5 in | Wt 183.0 lb

## 2017-12-10 DIAGNOSIS — I481 Persistent atrial fibrillation: Secondary | ICD-10-CM | POA: Diagnosis not present

## 2017-12-10 DIAGNOSIS — E7800 Pure hypercholesterolemia, unspecified: Secondary | ICD-10-CM

## 2017-12-10 DIAGNOSIS — I10 Essential (primary) hypertension: Secondary | ICD-10-CM

## 2017-12-10 DIAGNOSIS — I2510 Atherosclerotic heart disease of native coronary artery without angina pectoris: Secondary | ICD-10-CM

## 2017-12-10 DIAGNOSIS — I4819 Other persistent atrial fibrillation: Secondary | ICD-10-CM

## 2017-12-10 LAB — LIPID PANEL
Chol/HDL Ratio: 4.4 ratio (ref 0.0–5.0)
Cholesterol, Total: 162 mg/dL (ref 100–199)
HDL: 37 mg/dL — ABNORMAL LOW (ref >39)
LDL Calculated: 104 mg/dL — ABNORMAL HIGH (ref 0–99)
Triglycerides: 106 mg/dL (ref 0–149)
VLDL Cholesterol Cal: 21 mg/dL (ref 5–40)

## 2017-12-10 NOTE — Patient Instructions (Signed)
Medication Instructions:  Your physician recommends that you continue on your current medications as directed. Please refer to the Current Medication list given to you today.   Labwork: Lab work to be done today--Lipid profile  Testing/Procedures: none  Follow-Up: You are scheduled to see Dr. Angelena Form again on June 27,2019 at 8:40  Any Other Special Instructions Will Be Listed Below (If Applicable).     If you need a refill on your cardiac medications before your next appointment, please call your pharmacy.

## 2017-12-19 ENCOUNTER — Ambulatory Visit: Payer: Medicare Other | Admitting: Neurology

## 2017-12-19 ENCOUNTER — Encounter: Payer: Self-pay | Admitting: Neurology

## 2017-12-19 VITALS — BP 136/77 | HR 78 | Ht 69.5 in | Wt 189.5 lb

## 2017-12-19 DIAGNOSIS — R202 Paresthesia of skin: Secondary | ICD-10-CM

## 2017-12-19 DIAGNOSIS — E538 Deficiency of other specified B group vitamins: Secondary | ICD-10-CM

## 2017-12-19 NOTE — Patient Instructions (Signed)
   We will check EMG and NCV study of the nerves of the legs to look at the nerve function of the legs.

## 2017-12-19 NOTE — Progress Notes (Signed)
Reason for visit: Peripheral neuropathy  Referring physician: Dr. Alfredo Bach [name] is a 84 y.o. male  History of present illness:  William Weber is a an 84 year old right-handed white male with a history of diabetes diagnosed about 4 years ago.  The patient is diet controlled currently with the diabetes.  He gives a 20-year history of pain in his feet that mainly occurs with walking.  More recently he has noted some problems with numbness and tingling in the feet and legs, he has a sensation as if his skin is tight up to the knees, he has had a chronic problem with a back injury that occurred when he was in his 69s, he has some numbness in the left lateral thigh from this and some discomfort down the left leg from the back.  The patient reports that at nighttime when he goes to bed he has increased numbness and tingling in the feet and shooting pains in the fingers of the hands bilaterally.  He denies any significant neck pain or pain down the arms.  He does have some gait instability, he reports no recent falls, he does not use a cane for ambulation.  He reports no issues controlling the bowels or the bladder.  The patient has good strength in all 4 extremities.  He recently was told that he had gout and was started on gout medication which helped his foot pain significantly.  He continues to have discomfort in the feet at nighttime however.  He gets up frequently at night to use the bathroom, he has a prior history of prostate cancer.  The patient is sent to this office for further evaluation of the above symptoms.  Past Medical History:  Diagnosis Date  . Anxiety    mild  . Atrial fibrillation ([place])   . CAD (coronary artery disease)    DES proximal circumflex January, 2009  /   nuclear, December, 2011, no ischemia, ejection fraction 70%  . Carotid artery disease ([place])    Doppler, August, 2011, zero 83% RIC A., 38-25% LICA, stable  . Chronic headaches   . COPD (chronic  obstructive pulmonary disease) ([place])   . Dyslipidemia    statin intolerance  . Ejection fraction    EF 70%, nuclear, December, 2011  . GERD (gastroesophageal reflux disease)   . Hiatal hernia   . Hx of CABG    Hendrickson, 2002  . Hypercholesterolemia   . Hypertension   . Prostate cancer ([place])    TUR and implant  . Rash    2009, etiology not clear, not from Plavix, Claritin helped  . Statin intolerance     Past Surgical History:  Procedure Laterality Date  . BLADDER SURGERY    . CARDIAC SURGERY    . PROSTATE SURGERY      Family History  Problem Relation Age of Onset  . Diabetes Sister   . Diabetes Brother     Social history:  reports that he has never smoked. He has never used smokeless tobacco. He reports that he does not drink alcohol or use drugs.  Medications:  Prior to Admission medications   Medication Sig Start Date End Date Taking? Authorizing Provider  Ascorbic Acid (VITAMIN C WITH ROSE HIPS) 1000 MG tablet Take 1,000 mg by mouth daily.   Yes [provider]  aspirin 81 MG tablet Take 81 mg by mouth daily.   Yes [provider]  DiphenhydrAMINE HCl (BENADRYL ALLERGY PO) Take by mouth at  bedtime.   Yes [provider]  fish oil-omega-3 fatty acids 1000 MG capsule Take 3 g by mouth daily.    Yes [provider]  fluorouracil (EFUDEX) 5 % cream Apply 1 application topically 2 (two) times daily.   Yes [provider]  lansoprazole (PREVACID) 30 MG capsule Take 30 mg by mouth daily at 12 noon.   Yes [provider]  metoprolol (TOPROL-XL) 50 MG 24 hr tablet Take 50 mg by mouth daily. Pt taking half a tablet a day 08/28/11  Yes [provider]  triamterene-hydrochlorothiazide (DYAZIDE) 37.5-25 MG per capsule Take 1 capsule by mouth daily.  09/07/11  Yes [provider]      Allergies  Allergen Reactions  . Metformin And Related Diarrhea    GAS  . Percocet [Oxycodone-Acetaminophen]   .  Rabeprazole Sodium   . Statins     Hmg-Coa Reductase inhibitors, Myalgias (intolerance)    ROS:  Out of a complete 14 system review of symptoms, the patient complains only of the following symptoms, and all other reviewed systems are negative.  Hearing loss Itching Easy bruising Joint pain Allergies, runny nose, skin sensitivity Sleepiness, restless legs  Blood pressure 136/77, pulse 78, height 5' 9.5" (1.765 m), weight 189 lb 8 oz (86 kg).  Physical Exam  General: The patient is alert and cooperative at the time of the examination.  Eyes: Pupils are equal, round, and reactive to light. Discs are flat bilaterally.  Neck: The neck is supple, no carotid bruits are noted.  Respiratory: The respiratory examination is clear.  Cardiovascular: The cardiovascular examination reveals a regular rate and rhythm, no obvious murmurs or rubs are noted.  Skin: Extremities are without significant edema.  Neurologic Exam  Mental status: The patient is alert and oriented x 3 at the time of the examination. The patient has apparent normal recent and remote memory, with an apparently normal attention span and concentration ability.  Cranial nerves: Facial symmetry is present. There is good sensation of the face to pinprick and soft touch bilaterally. The strength of the facial muscles and the muscles to head turning and shoulder shrug are normal bilaterally. Speech is well enunciated, no aphasia or dysarthria is noted. Extraocular movements are full. Visual fields are full. The tongue is midline, and the patient has symmetric elevation of the soft palate. No obvious hearing deficits are noted.  Motor: The motor testing reveals 5 over 5 strength of all 4 extremities. Good symmetric motor tone is noted throughout.  Sensory: Sensory testing is intact to pinprick, soft touch, vibration sensation, and position sense on all 4 extremities, with exception of a stocking pattern pinprick sensory deficit 1  [place] up the legs bilaterally, impairment of vibration and position sense in the left foot more so than the right. No evidence of extinction is noted.  Coordination: Cerebellar testing reveals good finger-nose-finger and heel-to-shin bilaterally.  Gait and station: Gait is slightly wide-based, the patient can walk independently.  Tandem gait is unsteady.  Romberg is negative. No drift is seen.  The patient can walk on the toes bilaterally, he has difficulty walking on the heels on the left foot, able to perform on the right.  Reflexes: Deep tendon reflexes are symmetric, but are slightly depressed bilaterally. Toes are downgoing bilaterally.   Assessment/Plan:  1.  Probable diabetic peripheral neuropathy  2.  Low back pain, left-sided leg discomfort  3.  Chronic foot pain, history of gout  The patient likely has a multifactorial  foot discomfort associated with neuropathy, degenerative arthritis, and possibly gout.  The patient also has history of a back injury, he has sensory symptoms that are more prominent on the left foot than the right that may be related to the back, but he likely does have some degree of a peripheral neuropathy.  The patient will be set up for blood work today, he will have nerve conduction studies on both legs and one arm, he will have EMG of the left leg.  If the patient desires to go on medications for his neuropathy, he is to contact me.  He will return for the EMG evaluation.  In the past, he has been on amitriptyline and gabapentin without good control of the discomfort.  William Alexanders MD 12/19/2017 9:36 AM  [facility] [address] [place], [place] [address]  Phone [phone] Fax [phone]

## 2017-12-24 ENCOUNTER — Telehealth: Payer: Self-pay | Admitting: Neurology

## 2017-12-24 LAB — MULTIPLE MYELOMA PANEL, SERUM
Albumin SerPl Elph-Mcnc: 3.7 g/dL (ref 2.9–4.4)
Albumin/Glob SerPl: 1.2 (ref 0.7–1.7)
Alpha 1: 0.2 g/dL (ref 0.0–0.4)
Alpha2 Glob SerPl Elph-Mcnc: 0.8 g/dL (ref 0.4–1.0)
B-Globulin SerPl Elph-Mcnc: 1.2 g/dL (ref 0.7–1.3)
Gamma Glob SerPl Elph-Mcnc: 1.1 g/dL (ref 0.4–1.8)
Globulin, Total: 3.3 g/dL (ref 2.2–3.9)
IgA/Immunoglobulin A, Serum: 428 mg/dL (ref 61–437)
IgG (Immunoglobin G), Serum: 1093 mg/dL (ref 700–1600)
IgM (Immunoglobulin M), Srm: 109 mg/dL (ref 15–143)
M Protein SerPl Elph-Mcnc: 0.2 g/dL — ABNORMAL HIGH
Total Protein: 7 g/dL (ref 6.0–8.5)

## 2017-12-24 LAB — SEDIMENTATION RATE: Sed Rate: 4 mm/hr (ref 0–30)

## 2017-12-24 LAB — B. BURGDORFI ANTIBODIES: Lyme IgG/IgM Ab: <0.91 {ISR} (ref 0.00–0.90)

## 2017-12-24 LAB — ANA W/REFLEX: Anti Nuclear Antibody(ANA): NEGATIVE

## 2017-12-24 LAB — VITAMIN B12: Vitamin B-12: 519 pg/mL (ref 232–1245)

## 2017-12-24 LAB — ANGIOTENSIN CONVERTING ENZYME: Angio Convert Enzyme: 44 U/L (ref 14–82)

## 2017-12-24 NOTE — Telephone Encounter (Signed)
I called the patient.  The blood work was normal with exception that there is a monoclonal antibody, IgG with lambda chain.  Monoclonal antibodies are common in older man.  The patient will follow-up for EMG and nerve conduction study evaluation.

## 2018-01-08 ENCOUNTER — Encounter: Payer: Self-pay | Admitting: Pharmacist

## 2018-01-08 ENCOUNTER — Ambulatory Visit (INDEPENDENT_AMBULATORY_CARE_PROVIDER_SITE_OTHER): Payer: Medicare Other | Admitting: Pharmacist

## 2018-01-08 DIAGNOSIS — E785 Hyperlipidemia, unspecified: Secondary | ICD-10-CM

## 2018-01-08 NOTE — Progress Notes (Signed)
Patient ID: William Weber                 DOB: [birth year]                    MRN: [id number]     HPI: William Weber is a 84 y.o. male patient of Dr. Angelena Form that presents today for lipid evaluation.  PMH includes  CAD s/p CABG, atrial fibrillation, HTN, HLD, COPD and statin intolerance.  He presents today for discussion of cholesterol. He states he is not good at taking pills and they have a fast effect on him. He reports that he has previously tried 5 or 6 statin medications with Dr. Redmond Pulling (his primary doctor) and they made him feel horrible. He states that his whole body hurts and his muscles to ache. He is unable to recall any of the names of the medications that were tried. Looking back at the EHR it seems that the majority of these were tried prior to our electronic record. Will reach out to Dr. Redmond Pulling to see if they have record of statins he has tried since we do not have these on file.   Pt states that he ultimately would like to try the injection medications because he does not want to be incapacitated and a burden to his family. 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 He does state that life to him is not a great as it once was after the loss of his wife. He states his only true joy is driving sports cars.    Risk Factors: CAD s/p PCI in 2009, s/p CABG in 2002, HTN LDL Goal: <70, nonHDL<100  Current Medications: Fish oil 3g daily Intolerances: He does not recall names, but knows that it was at least 5 different medications tried by Dr. Redmond Pulling. (muscle aches and hurt all over)  Diet: He endorses eating out mostly. He does occasionally eat from home. He eats at [employer] often (eggs and toast for breakfast, [place] club for dinner and vegetables). He does a lot of vegetables. He does snack at night - chips or apples.   Ex[facility]: [facility] - has not the past few weeks due to  Dr. Kendrick Fries. He is limited due to pain in feet and knees. He is active of home. He does his own yard work and housekeeping.   Family History: DM in sister and brother  Social History: Denies tobacco and alcohol.   Labs: 12/10/17:  TC 162, TG 106, HDL 37, LDL 104, nonHDL 125 (Fish oil 3 g daily)  Past Medical History:  Diagnosis Date  . Anxiety    mild  . Atrial fibrillation ([place])   . CAD (coronary artery disease)    DES proximal circumflex January, 2009  /   nuclear, December, 2011, no ischemia, ejection fraction 70%  . Carotid artery disease ([place])    Doppler, August, 2011, zero 96% RIC A., 22-29% LICA, stable  . Chronic headaches   . COPD (chronic obstructive pulmonary disease) ([place])   . Dyslipidemia    statin intolerance  . Ejection fraction    EF 70%, nuclear, December, 2011  . GERD (gastroesophageal reflux disease)   . Hiatal hernia   . Hx of CABG    Hendrickson, 2002  . Hypercholesterolemia   . Hypertension   . Prostate cancer (  [place])    TUR and implant  . Rash    2009, etiology not clear, not from Plavix, Claritin helped  . Statin intolerance     Current Outpatient Medications on File Prior to Visit  Medication Sig Dispense Refill  . Ascorbic Acid (VITAMIN C WITH ROSE HIPS) 1000 MG tablet Take 1,000 mg by mouth daily.    Marland Kitchen aspirin 81 MG tablet Take 81 mg by mouth daily.    . DiphenhydrAMINE HCl (BENADRYL ALLERGY PO) Take by mouth at bedtime.    . fish oil-omega-3 fatty acids 1000 MG capsule Take 3 g by mouth daily.     . fluorouracil (EFUDEX) 5 % cream Apply 1 application topically 2 (two) times daily.    . lansoprazole (PREVACID) 30 MG capsule Take 30 mg by mouth daily at 12 noon.    . metoprolol (TOPROL-XL) 50 MG 24 hr tablet Take 50 mg by mouth daily. Pt taking half a tablet a day    . triamterene-hydrochlorothiazide (DYAZIDE) 37.5-25 MG per capsule Take 1 capsule by mouth daily.      No current facility-administered medications on file prior to visit.      Allergies  Allergen Reactions  . Metformin And Related Diarrhea    GAS  . Percocet [Oxycodone-Acetaminophen]   . Rabeprazole Sodium   . Statins     Hmg-Coa Reductase inhibitors, Myalgias (intolerance)    Assessment/Plan: Hyperlipidemia: LDL not at goal. Will see if Dr. Redmond Pulling has record of statins that have been tried. If so will be able to pursue PCSK9i therapy. Pt filled out patient assistance information today. Will need to obtain coverage through insurance.   Spoke with Tammy at Dr. Dois Davenport office and they will review records and fax statin history to our office.    Thank you,  Lelan Pons. Patterson Hammersmith, [place]  01/08/2018 7:18 AM  Dr[facility] Dav[facility] office called back to report that they do not have record of statin medications in their system either. She pulled up their previous EHR dating back several years and this also did not reveal his statin history. Dr. Dois Davenport notes also have him listed as statin intolerant back in late 1990s and early 2000s, but do not have doses or medications tried.

## 2018-01-08 NOTE — Patient Instructions (Addendum)
We will send for coverage of PCSK9 inhibitor therapy (Praluent injection 75mg  every 14 days). We will send for patient assistance through the PASS program.   Please call the office [phone]) if you have any questions or concerns.    Cholesterol Cholesterol is a fat. Your body needs a small amount of cholesterol. Cholesterol (plaque) may build up in your blood vessels (arteries). That makes you more likely to have a heart attack or stroke. You cannot feel your cholesterol level. Having a blood test is the only way to find out if your level is high. Keep your test results. Work with your doctor to keep your cholesterol at a good level. What do the results mean?  Total cholesterol is how much cholesterol is in your blood.  LDL is bad cholesterol. This is the type that can build up. Try to have low LDL.  HDL is good cholesterol. It cleans your blood vessels and carries LDL away. Try to have high HDL.  Triglycerides are fat that the body can store or burn for energy. What are good levels of cholesterol?  Total cholesterol below 200.  LDL below 100 is good for people who have health risks. LDL below 70 is good for people who have very high risks.  HDL above 40 is good. It is best to have HDL of 60 or higher.  Triglycerides below 150. How can I lower my cholesterol? Diet Follow your diet program as told by your doctor.  Choose fish, white meat chicken, or [place] that is roasted or baked. Try not to eat red meat, fried foods, sausage, or lunch meats.  Eat lots of fresh fruits and vegetables.  Choose whole grains, beans, pasta, potatoes, and cereals.  Choose olive oil, corn oil, or canola oil. Only use small amounts.  Try not to eat butter, mayonnaise, shortening, or palm kernel oils.  Try not to eat foods with trans fats.  Choose low-fat or nonfat dairy foods. ? Drink skim or nonfat milk. ? Eat low-fat or nonfat yogurt and cheeses. ? Try not to drink whole milk or  cream. ? Try not to eat ice cream, egg yolks, or full-fat cheeses.  Healthy desserts include angel food cake, ginger snaps, animal crackers, hard candy, popsicles, and low-fat or nonfat frozen yogurt. Try not to eat pastries, cakes, pies, and cookies.  Exercise Follow your exercise program as told by your doctor.  Be more active. Try gardening, walking, and taking the stairs.  Ask your doctor about ways that you can be more active.  Medicine  Take over-the-counter and prescription medicines only as told by your doctor. This information is not intended to replace advice given to you by your health care provider. Make sure you discuss any questions you have with your health care provider. Document Released: 12/01/2008 Document Revised: 04/05/2016 Document Reviewed: 03/16/2016 Elsevier Interactive Patient Education  [employer].

## 2018-01-22 ENCOUNTER — Encounter: Payer: Self-pay | Admitting: Neurology

## 2018-01-22 ENCOUNTER — Ambulatory Visit: Payer: Medicare Other | Admitting: Neurology

## 2018-01-22 ENCOUNTER — Ambulatory Visit (INDEPENDENT_AMBULATORY_CARE_PROVIDER_SITE_OTHER): Payer: Medicare Other | Admitting: Neurology

## 2018-01-22 DIAGNOSIS — E1142 Type 2 diabetes mellitus with diabetic polyneuropathy: Secondary | ICD-10-CM

## 2018-01-22 DIAGNOSIS — R202 Paresthesia of skin: Secondary | ICD-10-CM | POA: Diagnosis not present

## 2018-01-22 DIAGNOSIS — D472 Monoclonal gammopathy: Secondary | ICD-10-CM

## 2018-01-22 HISTORY — DX: Monoclonal gammopathy: D47.2

## 2018-01-22 HISTORY — DX: Type 2 diabetes mellitus with diabetic polyneuropathy: E11.42

## 2018-01-22 MED ORDER — GABAPENTIN 300 MG PO CAPS: 300.0000 mg | ORAL_CAPSULE | Freq: Every day | ORAL | 3 refills | Status: DC

## 2018-01-22 NOTE — Progress Notes (Signed)
Please refer to EMG and nerve conduction study procedure note. 

## 2018-01-22 NOTE — Procedures (Signed)
     HISTORY:  William Weber is an 84 year old gentleman with a history of bilateral foot discomfort that is worse in the evening hours, particular if he is active during the day.  The patient is being evaluated for a possible underlying peripheral neuropathy.  He does have a history of diabetes.  NERVE CONDUCTION STUDIES:  Nerve conduction studies were performed on the right upper extremity.  The distal motor latencies for the right median and ulnar nerves were within normal limits with normal motor amplitudes seen for these nerves.  Slowing was seen above and below the elbow for the right ulnar nerve, normal nerve conduction velocity seen for the right median nerve.  The right radial sensory latency was prolonged, and the median and ulnar sensory latencies on the right were prolonged as well.  Nerve conduction studies were performed on both lower extremities.  The distal motor latencies for the peroneal and posterior tibial nerves were normal bilaterally with low motor amplitudes seen for these nerves bilaterally.  Slowing was seen for the peroneal and posterior tibial nerves bilaterally.  The sensory latencies for the sural and peroneal nerves were unobtainable bilaterally.  The F-wave latencies for the posterior tibial nerves were prolonged bilaterally.  EMG STUDIES:  EMG study was performed on the left lower extremity:  The tibialis anterior muscle reveals 2 to 4K motor units with full recruitment. No fibrillations or positive waves were seen. The peroneus tertius muscle reveals 2 to 5K motor units with decreased recruitment. No fibrillations or positive waves were seen. The medial gastrocnemius muscle reveals 1 to 3K motor units with full recruitment. No fibrillations or positive waves were seen. The vastus lateralis muscle reveals 2 to 4K motor units with full recruitment. No fibrillations or positive waves were seen. The iliopsoas muscle reveals 2 to 4K motor units with full  recruitment. No fibrillations or positive waves were seen. The biceps femoris muscle (long head) reveals 2 to 4K motor units with full recruitment. No fibrillations or positive waves were seen. The lumbosacral paraspinal muscles were tested at 3 levels, and revealed no abnormalities of insertional activity at all 3 levels tested. There was good relaxation.   IMPRESSION:  Nerve conduction studies done on both lower extremities shows evidence of a generalized primarily axonal peripheral neuropathy of moderate severity.  EMG evaluation of the left lower extremity shows mild distal chronic stable changes of denervation consistent with a peripheral neuropathy, no evidence of an overlying lumbosacral radiculopathy is seen.  Jill Alexanders MD 01/22/2018 11:25 AM  [facility] [address] [place], [place] [address]  Phone [phone] Fax [phone]

## 2018-01-22 NOTE — Progress Notes (Signed)
The patient comes in today for EMG nerve conduction study evaluation.  Nerve conductions show evidence of a moderate severity primarily axonal peripheral neuropathy.  EMG of the left lower extremity shows some mild distal signs of denervation, no evidence of an overlying lumbosacral radiculopathy.  The patient is having difficulty sleeping at night secondary to neuropathy pain, we will start gabapentin 300 mg at night, the patient will call for any dose adjustments.  Prior blood work shows evidence of MGUS, further blood work and urine studies will be done.

## 2018-01-23 LAB — PROTEIN ELECTROPHORESIS, URINE REFLEX
Albumin ELP, Urine: 100 %
Alpha-1-Globulin, U: 0 %
Alpha-2-Globulin, U: 0 %
Beta Globulin, U: 0 %
Gamma Globulin, U: 0 %
Protein, Ur: 7.5 mg/dL

## 2018-01-23 LAB — CBC WITH DIFFERENTIAL/PLATELET
Basophils Absolute: 0 10*3/uL (ref 0.0–0.2)
Basos: 0 %
EOS (ABSOLUTE): 0.2 10*3/uL (ref 0.0–0.4)
Eos: 3 %
Hematocrit: 40 % (ref 37.5–51.0)
Hemoglobin: 13.4 g/dL (ref 13.0–17.7)
Immature Grans (Abs): 0 10*3/uL (ref 0.0–0.1)
Immature Granulocytes: 0 %
Lymphocytes Absolute: 1.5 10*3/uL (ref 0.7–3.1)
Lymphs: 28 %
MCH: 31 pg (ref 26.6–33.0)
MCHC: 33.5 g/dL (ref 31.5–35.7)
MCV: 93 fL (ref 79–97)
Monocytes Absolute: 0.4 10*3/uL (ref 0.1–0.9)
Monocytes: 7 %
Neutrophils Absolute: 3.2 10*3/uL (ref 1.4–7.0)
Neutrophils: 62 %
Platelets: 136 10*3/uL — ABNORMAL LOW (ref 150–379)
RBC: 4.32 x10E6/uL (ref 4.14–5.80)
RDW: 14.7 % (ref 12.3–15.4)
WBC: 5.3 10*3/uL (ref 3.4–10.8)

## 2018-01-23 LAB — COMPREHENSIVE METABOLIC PANEL
ALT: 43 IU/L (ref 0–44)
AST: 39 IU/L (ref 0–40)
Albumin/Globulin Ratio: 1.6 (ref 1.2–2.2)
Albumin: 4.5 g/dL (ref 3.5–4.7)
Alkaline Phosphatase: 52 IU/L (ref 39–117)
BUN/Creatinine Ratio: 15 (ref 10–24)
BUN: 15 mg/dL (ref 8–27)
Bilirubin Total: 0.8 mg/dL (ref 0.0–1.2)
CO2: 24 mmol/L (ref 20–29)
Calcium: 9.1 mg/dL (ref 8.6–10.2)
Chloride: 101 mmol/L (ref 96–106)
Creatinine, Ser: 1 mg/dL (ref 0.76–1.27)
GFR calc Af Amer: 78 mL/min/{1.73_m2} (ref >59)
GFR calc non Af Amer: 68 mL/min/{1.73_m2} (ref >59)
Globulin, Total: 2.8 g/dL (ref 1.5–4.5)
Glucose: 146 mg/dL — ABNORMAL HIGH (ref 65–99)
Potassium: 4.8 mmol/L (ref 3.5–5.2)
Sodium: 139 mmol/L (ref 134–144)
Total Protein: 7.3 g/dL (ref 6.0–8.5)

## 2018-01-23 LAB — LACTATE DEHYDROGENASE: LDH: 261 IU/L — ABNORMAL HIGH (ref 121–224)

## 2018-01-24 ENCOUNTER — Telehealth: Payer: Self-pay | Admitting: Neurology

## 2018-01-24 MED ORDER — GABAPENTIN 300 MG PO CAPS: 300.0000 mg | ORAL_CAPSULE | Freq: Every day | ORAL | 3 refills | Status: DC

## 2018-01-24 NOTE — Telephone Encounter (Signed)
I called the patient.  The blood work shows a mild elevation in LDH level, otherwise normal calcium level, slightly low platelet level that is chronic in nature, improved from 4 years ago.  We will follow the serum immunoelectrophoresis over time, if the antibody level increases, we will consider an oncology referral.  No protein/antibody in urine study.

## 2018-03-13 NOTE — Progress Notes (Signed)
Chief Complaint  Patient presents with  . Follow-up    CAD   History of Present Illness: 84 yo male with history of CAD s/p CABG, atrial fibrillation, HTN, HLD, COPD and statin intolerance here today for cardiac follow up. He had been followed by Dr. Ron Parker. He had a 5 vessel CABG in 2002 (LIMA to LAD, SVG to D1, sequential SVG to PDA and PLA, SVG to OM1)and a drug eluting stent placed in the Circumflex in 2009. 4 bypass grafts were noted to be patent by cath in 2009. The vein graft to the PDA did not supply the PLA as reported in the op report. He is known to have PAF but has refused anti-coagulation in the past. Echo January 2015 with normal LV systolic function, trivial AI, mild MR. He has been on ASA only. He has carotid artery disease (84-66% LICA stenosis and 5-99% RICA stenosis by dopplers 2016).   He is here today for follow up. The patient denies any chest pain, dyspnea, palpitations, lower extremity edema, orthopnea, PND, dizziness, near syncope or syncope. He worked in his yard all day yesterday.   Primary Care Physician: Christain Sacramento, MD  Past Medical History:  Diagnosis Date  . Anxiety    mild  . Atrial fibrillation ([place])   . CAD (coronary artery disease)    DES proximal circumflex January, 2009  /   nuclear, December, 2011, no ischemia, ejection fraction 70%  . Carotid artery disease ([place])    Doppler, August, 2011, zero 35% RIC A., 70-17% LICA, stable  . Chronic headaches   . COPD (chronic obstructive pulmonary disease) ([place])   . Diabetic peripheral neuropathy ([place]) 01/22/2018  . Dyslipidemia    statin intolerance  . Ejection fraction    EF 70%, nuclear, December, 2011  . GERD (gastroesophageal reflux disease)   . Hiatal hernia   . Hx of CABG    [place], 2002  . Hypercholesterolemia   . Hypertension   . MGUS (monoclonal gammopathy of unknown significance) 01/22/2018  . Prostate cancer ([place])    TUR and implant  . Rash    2009, etiology not clear, not from Plavix,  Claritin helped  . Statin intolerance     Past Surgical History:  Procedure Laterality Date  . BLADDER SURGERY    . CARDIAC SURGERY    . PROSTATE SURGERY      Current Outpatient Medications  Medication Sig Dispense Refill  . Ascorbic Acid (VITAMIN C WITH ROSE HIPS) 1000 MG tablet Take 1,000 mg by mouth daily.    Marland Kitchen aspirin 81 MG tablet Take 81 mg by mouth daily.    . DiphenhydrAMINE HCl (BENADRYL ALLERGY PO) Take by mouth at bedtime.    . fish oil-omega-3 fatty acids 1000 MG capsule Take 3 g by mouth daily.     . fluorouracil (EFUDEX) 5 % cream Apply 1 application topically 2 (two) times daily.    Marland Kitchen gabapentin (NEURONTIN) 300 MG capsule Take 1 capsule (300 mg total) by mouth at bedtime. 30 capsule 3  . lansoprazole (PREVACID) 30 MG capsule Take 30 mg by mouth daily at 12 noon.    . metoprolol (TOPROL-XL) 50 MG 24 hr tablet Take 50 mg by mouth daily. Pt taking half a tablet a day    . triamterene-hydrochlorothiazide (DYAZIDE) 37.5-25 MG per capsule Take 1 capsule by mouth daily.     Marland Kitchen ezetimibe (ZETIA) 10 MG tablet Take 1 tablet (10 mg total) by mouth daily. 30 tablet 11  No current facility-administered medications for this visit.     Allergies  Allergen Reactions  . Metformin And Related Diarrhea    GAS  . Percocet [Oxycodone-Acetaminophen]   . Rabeprazole Sodium   . Statins     Hmg-Coa Reductase inhibitors, Myalgias (intolerance)    Social History   Socioeconomic History  . Marital status: Married    Spouse name: Not on file  . Number of children: Not on file  . Years of education: Not on file  . Highest education level: Not on file  Occupational History  . Not on file  Social Needs  . Financial resource strain: Not on file  . Food insecurity:    Worry: Not on file    Inability: Not on file  . Transportation needs:    Medical: Not on file    Non-medical: Not on file  Tobacco Use  . Smoking status: Never Smoker  . Smokeless tobacco: Never Used  Substance and  Sexual Activity  . Alcohol use: No    Frequency: Never  . Drug use: No  . Sexual activity: Not on file  Lifestyle  . Physical activity:    Days per week: Not on file    Minutes per session: Not on file  . Stress: Not on file  Relationships  . Social connections:    Talks on phone: Not on file    Gets together: Not on file    Attends religious service: Not on file    Active member of club or organization: Not on file    Attends meetings of clubs or organizations: Not on file    Relationship status: Not on file  . Intimate partner violence:    Fear of current or ex partner: Not on file    Emotionally abused: Not on file    Physically abused: Not on file    Forced sexual activity: Not on file  Other Topics Concern  . Not on file  Social History Narrative  . Not on file    Family History  Problem Relation Age of Onset  . Diabetes Sister   . Diabetes Brother     Review of Systems:  As stated in the HPI and otherwise negative.   BP 132/78   Pulse 77   Ht 5' 9.5" (1.765 m)   Wt 187 lb (84.8 kg)   SpO2 99%   BMI 27.22 kg/m   Physical Examination:  General: Well developed, well nourished, NAD  HEENT: OP clear, mucus membranes moist  SKIN: warm, dry. No rashes. Neuro: No focal deficits  Musculoskeletal: Muscle strength 5/5 all ext  Psychiatric: Mood and affect normal  Neck: No JVD, no carotid bruits, no thyromegaly, no lymphadenopathy.  Lungs:Clear bilaterally, no wheezes, rhonci, crackles Cardiovascular: Regular rate and rhythm. No murmurs, gallops or rubs. Abdomen:Soft. Bowel sounds present. Non-tender.  Extremities: No lower extremity edema. Pulses are 2 + in the bilateral DP/PT.  Echo January 2015:   - Left ventricle: The cavity size was normal. There was mild focal basal hypertrophy of the septum. Systolic function was normal. The estimated ejection fraction was in the range of 55% to 60%. Wall motion was normal; there were no regional wall motion  abnormalities. - Aortic valve: Trivial regurgitation. - Mitral valve: Calcified annulus. Mild regurgitation. - Left atrium: The atrium was mildly dilated.  Cardiac cath January 2009:   RESULTS:  Left main coronary artery:  The left main coronary artery was  free of significant disease.   Left anterior  descending artery:  The left anterior descending artery  gave rise to 2 diagonal branches and then there was competing flow  distally.  There was a 50% narrowing in the proximal LAD and there was  an 80% ostial narrowing in the first diagonal branch.   Circumflex artery:  The circumflex artery gave rise to a ramus branch, a  marginal branch and 2 posterolateral branches.  There was 80% stenosis  in the ramus branch.  The marginal branch was completely occluded.  There was a complex lesion in the proximal circumflex artery which  extended over about 15 mm right after a sharp bend in the circumflex  artery.  There was also an 80% lesion at the ostium of the first of the  posterolateral branches.   Right coronary artery:  The right coronary artery was a moderate-sized  vessel and gave rise to a right ventricular branch, a posterior  descending branch and a posterolateral branch.  There was a 50% ostial  stenosis and a long 50% stenosis in the midportion of the vessel.  There  was a 90% stenosis in the  posterolateral branch and an 80% stenosis in  the posterolateral branch.  There was competing flow in the  posterolateral branch.   The saphenous vein graft to the posterior descending branch of the right  coronary was patent and functioned normally.  The previous report said  there was a graft to the posterior descending branch as well but we were  not able to identify that there was a connection on our angiogram today.   The saphenous vein graft to the marginal branch of the circumflex artery  was patent with a 20% narrowing in the proximal portion.   The saphenous vein graft to  the diagonal branch of the LAD was patent  with luminal irregularities.   The LIMA graft to the LAD was patent and functioned normally.   Left ventriculogram:  Left ventriculogram performed in the RAO  projection showed good wall motion with no areas of hypokinesis.  The  estimated ejection fraction was 60%.   Following stenting of the lesion in the circumflex artery, stenosis  improved from 95% to 0%.   CONCLUSION:  1. Coronary artery disease status post prior coronary bypass graft      surgery 2002.  2. Severe native vessel disease with 50% stenosis in the proximal LAD,      80% ostial stenosis in the first large diagonal branch, 80%      stenosis in a small ramus branch of the circumflex artery, total      occlusion of the marginal branch of the circumflex artery, 95%      stenosis in the proximal circumflex artery, a 50% ostial and 50%      mid stenosis in the right coronary artery with 90 and 80% stenosis      in the large posterolateral branch.  3. Patent vein graft to the posterolateral branch of the right      coronary artery, patent vein graft to the marginal branch of the      circumflex artery with 20% proximal stenosis, patent vein graft to      the diagonal branch of the LAD, and patent LIMA graft to the LAD.  4. Normal left ventricular function with estimated ejection fraction      of 60%.  5. Successful percutaneous coronary intervention of the lesion in the      proximal native circumflex artery using a Promus drug-eluting stent  with improvement of the __________  narrowing from 95% to 0%.  EKG:  EKG is not ordered today. The ekg ordered today demonstrates   Recent Labs: 01/22/2018: ALT 43; BUN 15; Creatinine, Ser 1.00; Hemoglobin 13.4; Platelets 136; Potassium 4.8; Sodium 139   Lipid Panel Followed in primary care   Wt Readings from Last 3 Encounters:  03/14/18 187 lb (84.8 kg)  12/19/17 189 lb 8 oz (86 kg)  12/10/17 183 lb (83 kg)     Other  studies Reviewed: Additional studies/ records that were reviewed today include: . Review of the above records demonstrates:    Assessment and Plan:   1. CAD s/p CABG with angina: Rare chest pains c/w stable angina. He refuses a statin. Continue ASA and beta blocker.    2. Persistent atrial fibrillation: He is in atrial fibrillation. His CHADS VASC score is 3. He refuses a DOAC. Continue Toprol.    3. HTN: BP is controlled. No changes  4. HLD: He statin intolerant. He has refused to consider Praluent or Repatha. We discussed starting Zetia today. He agrees to try it. Will start Zetia 10 mg once per day.    5. Carotid artery disease: Last dopplers 2016. I will discuss repeating at next vist  Current medicines are reviewed at length with the patient today.  The patient does not have concerns regarding medicines.  The following changes have been made:  no change  Labs/ tests ordered today include:   No orders of the defined types were placed in this encounter.    Disposition:   FU with me in 6  months    Signed, Lauree Chandler, MD 03/14/2018 8:57 AM    [facility] [address], [place], [place]  [address] Phone: [phone]; Fax: [phone]

## 2018-03-14 ENCOUNTER — Ambulatory Visit: Payer: Medicare Other | Admitting: Cardiovascular Disease

## 2018-03-14 ENCOUNTER — Encounter: Payer: Self-pay | Admitting: Cardiovascular Disease

## 2018-03-14 ENCOUNTER — Encounter (INDEPENDENT_AMBULATORY_CARE_PROVIDER_SITE_OTHER): Payer: Self-pay

## 2018-03-14 VITALS — BP 132/78 | HR 77 | Ht 69.5 in | Wt 187.0 lb

## 2018-03-14 DIAGNOSIS — I2510 Atherosclerotic heart disease of native coronary artery without angina pectoris: Secondary | ICD-10-CM | POA: Diagnosis not present

## 2018-03-14 DIAGNOSIS — I481 Persistent atrial fibrillation: Secondary | ICD-10-CM

## 2018-03-14 DIAGNOSIS — E7800 Pure hypercholesterolemia, unspecified: Secondary | ICD-10-CM | POA: Diagnosis not present

## 2018-03-14 DIAGNOSIS — I10 Essential (primary) hypertension: Secondary | ICD-10-CM | POA: Diagnosis not present

## 2018-03-14 DIAGNOSIS — I4819 Other persistent atrial fibrillation: Secondary | ICD-10-CM

## 2018-03-14 MED ORDER — EZETIMIBE 10 MG PO TABS: 10.0000 mg | ORAL_TABLET | Freq: Every day | ORAL | 11 refills | Status: DC

## 2018-03-14 NOTE — Patient Instructions (Signed)
Medication Instructions:  Your physician has recommended you make the following change in your medication:  Start Zetia 10 mg by mouth daily.    Labwork: none  Testing/Procedures: none  Follow-Up: Your physician recommends that you schedule a follow-up appointment in: 6 months. Please call our office in about 2 months to schedule this appointment    Any Other Special Instructions Will Be Listed Below (If Applicable).     If you need a refill on your cardiac medications before your next appointment, please call your pharmacy.

## 2018-09-24 NOTE — Progress Notes (Signed)
Cardiology Office Note    Date:  09/25/2018   ID:  William Weber, DOB 12/30/1929, MRN [id number]  PCP:  William Sacramento, MD  Cardiologist: William Chandler, MD EPS: None  Chief Complaint  Patient presents with  . Follow-up    History of Present Illness:  William Weber is a 85 y.o. male with history of CAD status post CABG x5 in 2002 LIMA to the LAD, SVG to diagonal 1, sequential SVG to PDA and PLA, SVG to OM1.  He had a DES to the circumflex in 2009 4 grafts were patent, vein graft to PDA did not supply the PLA was reported in the op report.  Also has PAF has refused anticoagulation in the past, normal LV function on 2D echo and 2015, hypertension, HLD, COPD, statin intolerance-agreed to Zetia last office visit, carotid disease with 60 to 51% LICA and 0 to 76% R ICA in 2016  Last saw Dr. Angelena Weber 02/2018 and agreed to try Zetia.  Patient comes in today for 6 month f/u. Never started zetia. Complains of mild left chest discomfort and   when he eats or gets hungry. Thinks it's associated with Hiatal hernia. Goes to the gym 3 days a week and works out 40 min without chest pain or shortness of breath.Complains of feet neuropathy. Also has diabetes but refuses to take meds because of side effects. Blood sugars run in 200's he says because he's a junk food junky and eats ice cream on pie and potato chips at night. His wife died 5 yrs ago and he has lost his desire to live since then. He says he's 85 yo and is fine if he dies. He's a loner without friends and refuses to change anything.  Past Medical History:  Diagnosis Date  . Anxiety    mild  . Atrial fibrillation (William Weber)   . CAD (coronary artery disease)    DES proximal circumflex January, 2009  /   nuclear, December, 2011, no ischemia, ejection fraction 70%  . Carotid artery disease (William Weber)    Doppler, August, 2011, zero 16% RIC A., 07-37% LICA, stable  . Chronic headaches   . COPD (chronic obstructive pulmonary disease)  ([place])   . Diabetic peripheral neuropathy (William Weber) 01/22/2018  . Dyslipidemia    statin intolerance  . Ejection fraction    EF 70%, nuclear, December, 2011  . GERD (gastroesophageal reflux disease)   . Hiatal hernia   . Hx of CABG    William Weber, 2002  . Hypercholesterolemia   . Hypertension   . MGUS (monoclonal gammopathy of unknown significance) 01/22/2018  . Prostate cancer (William Weber)    TUR and implant  . Rash    2009, etiology not clear, not from Plavix, Claritin helped  . Statin intolerance     Past Surgical History:  Procedure Laterality Date  . BLADDER SURGERY    . CARDIAC SURGERY    . PROSTATE SURGERY      Current Medications: Current Meds  Medication Sig  . Ascorbic Acid (VITAMIN C WITH ROSE HIPS) 1000 MG tablet Take 1,000 mg by mouth daily.  Marland Kitchen aspirin 81 MG tablet Take 81 mg by mouth daily.  . DiphenhydrAMINE HCl (BENADRYL ALLERGY PO) Take by mouth at bedtime.  Marland Kitchen ezetimibe (ZETIA) 10 MG tablet Take 1 tablet (10 mg total) by mouth daily.  . fish oil-omega-3 fatty acids 1000 MG capsule Take 3 g by mouth daily.   . lansoprazole (PREVACID) 30 MG capsule Take 30 mg by  mouth daily at 12 noon.  . metoprolol (TOPROL-XL) 50 MG 24 hr tablet Take 50 mg by mouth daily. Pt taking half a tablet a day  . triamterene-hydrochlorothiazide (DYAZIDE) 37.5-25 MG per capsule Take 1 capsule by mouth daily.   . [DISCONTINUED] ezetimibe (ZETIA) 10 MG tablet Take 1 tablet (10 mg total) by mouth daily.     Allergies:   Allopurinol; Metformin and related; Percocet [oxycodone-acetaminophen]; Rabeprazole sodium; and Statins   Social History   Socioeconomic History  . Marital status: Married    Spouse name: Not on file  . Number of children: Not on file  . Years of education: Not on file  . Highest education level: Not on file  Occupational History  . Not on file  Social Needs  . Financial resource strain: Not on file  . Food insecurity:    Worry: Not on file    Inability: Not on file  .  Transportation needs:    Medical: Not on file    Non-medical: Not on file  Tobacco Use  . Smoking status: Never Smoker  . Smokeless tobacco: Never Used  Substance and Sexual Activity  . Alcohol use: No    Frequency: Never  . Drug use: No  . Sexual activity: Not on file  Lifestyle  . Physical activity:    Days per week: Not on file    Minutes per session: Not on file  . Stress: Not on file  Relationships  . Social connections:    Talks on phone: Not on file    Gets together: Not on file    Attends religious service: Not on file    Active member of club or organization: Not on file    Attends meetings of clubs or organizations: Not on file    Relationship status: Not on file  Other Topics Concern  . Not on file  Social History Narrative  . Not on file     Family History:  The patient's family history includes Diabetes in his brother and sister.   ROS:   Please see the history of present illness.    Review of Systems  Constitution: Negative.  HENT: Negative.   Cardiovascular: Negative.   Respiratory: Negative.   Endocrine: Negative.   Hematologic/Lymphatic: Negative.   Musculoskeletal: Negative.   Gastrointestinal: Negative.   Genitourinary: Negative.   Neurological: Positive for paresthesias.   All other systems reviewed and are negative.   PHYSICAL EXAM:   VS:  BP 128/76   Pulse 74   Ht 5' 9.5" (1.765 m)   Wt 190 lb (86.2 kg)   SpO2 97%   BMI 27.66 kg/m   Physical Exam  GEN: Well nourished, well developed, in no acute distress  Neck: no JVD, carotid bruits, or masses Cardiac:RRR; no murmurs, rubs, or gallops  Respiratory:  clear to auscultation bilaterally, normal work of breathing GI: soft, nontender, nondistended, + BS Ext: without cyanosis, clubbing, or edema, Good distal pulses bilaterally Neuro:  Alert and Oriented x 3 Psych: euthymic mood, full affect  Wt Readings from Last 3 Encounters:  09/25/18 190 lb (86.2 kg)  03/14/18 187 lb (84.8 kg)    12/19/17 189 lb 8 oz (86 kg)      Studies/Labs Reviewed:   EKG:  EKG is not ordered today.    Recent Labs: 01/22/2018: ALT 43; BUN 15; Creatinine, Ser 1.00; Hemoglobin 13.4; Platelets 136; Potassium 4.8; Sodium 139   Lipid Panel    Component Value Date/Time   CHOL 162  12/10/2017 0939   TRIG 106 12/10/2017 0939   HDL 37 (L) 12/10/2017 0939   CHOLHDL 4.4 12/10/2017 0939   CHOLHDL 5.9 10/02/2007 0405   VLDL 25 10/02/2007 0405   LDLCALC 104 (H) 12/10/2017 0939    Additional studies/ records that were reviewed today include:    Echo January 2015:   - Left ventricle: The cavity size was normal. There was mild   focal basal hypertrophy of the septum. Systolic function   was normal. The estimated ejection fraction was in the   range of 55% to 60%. Wall motion was normal; there were no   regional wall motion abnormalities. - Aortic valve: Trivial regurgitation. - Mitral valve: Calcified annulus. Mild regurgitation. - Left atrium: The atrium was mildly dilated.   Cardiac cath January 2009:   RESULTS:  Left main coronary artery:  The left main coronary artery was  free of significant disease.    Left anterior descending artery:  The left anterior descending artery  gave rise to 2 diagonal branches and then there was competing flow  distally.  There was a 50% narrowing in the proximal LAD and there was  an 80% ostial narrowing in the first diagonal branch.    Circumflex artery:  The circumflex artery gave rise to a ramus branch, a  marginal branch and 2 posterolateral branches.  There was 80% stenosis  in the ramus branch.  The marginal branch was completely occluded.  There was a complex lesion in the proximal circumflex artery which  extended over about 15 mm right after a sharp bend in the circumflex  artery.  There was also an 80% lesion at the ostium of the first of the  posterolateral branches.    Right coronary artery:  The right coronary artery was a moderate-sized   vessel and gave rise to a right ventricular branch, a posterior  descending branch and a posterolateral branch.  There was a 50% ostial  stenosis and a long 50% stenosis in the midportion of the vessel.  There  was a 90% stenosis in the  posterolateral branch and an 80% stenosis in  the posterolateral branch.  There was competing flow in the  posterolateral branch.    The saphenous vein graft to the posterior descending branch of the right  coronary was patent and functioned normally.  The previous report said  there was a graft to the posterior descending branch as well but we were  not able to identify that there was a connection on our angiogram today.    The saphenous vein graft to the marginal branch of the circumflex artery  was patent with a 20% narrowing in the proximal portion.    The saphenous vein graft to the diagonal branch of the LAD was patent  with luminal irregularities.    The LIMA graft to the LAD was patent and functioned normally.    Left ventriculogram:  Left ventriculogram performed in the RAO  projection showed good wall motion with no areas of hypokinesis.  The  estimated ejection fraction was 60%.    Following stenting of the lesion in the circumflex artery, stenosis  improved from 95% to 0%.    CONCLUSION:  1. Coronary artery disease status post prior coronary bypass graft      surgery 2002.  2. Severe native vessel disease with 50% stenosis in the proximal LAD,      80% ostial stenosis in the first large diagonal branch, 80%      stenosis  in a small ramus branch of the circumflex artery, total      occlusion of the marginal branch of the circumflex artery, 95%      stenosis in the proximal circumflex artery, a 50% ostial and 50%      mid stenosis in the right coronary artery with 90 and 80% stenosis      in the large posterolateral branch.  3. Patent vein graft to the posterolateral branch of the right      coronary artery, patent vein graft to the  marginal branch of the      circumflex artery with 20% proximal stenosis, patent vein graft to      the diagonal branch of the LAD, and patent LIMA graft to the LAD.  4. Normal left ventricular function with estimated ejection fraction      of 60%.  5. Successful percutaneous coronary intervention of the lesion in the      proximal native circumflex artery using a Promus drug-eluting stent      with improvement of the __________  narrowing from 95% to 0%.     ASSESSMENT:    1. Coronary artery disease involving native coronary artery of native heart without angina pectoris   2. Persistent atrial fibrillation   3. Essential hypertension   4. Pure hypercholesterolemia   5. Stenosis of left carotid artery   6. Diabetic peripheral neuropathy (HCC)      PLAN:  In order of problems listed above:  CAD status post CABG x5 in 2002 DES to the circumflex in 2009 with 4 grafts patent.  Rare angina.  Follow-up with Dr. Angelena Weber in 6 months.  Persistent atrial fibrillation CHA2DS2-VASc equals 3 refuses DOAC.  Hypertension BP controlled.  Hyperlipidemia statin intolerant has refused Repatha or Praluent in the past.  Zetia started by Dr. Angelena Weber last June but patient never filled. He's agreeable to try now.  Carotid stenosis with 60 to 11% LICA and 0 to 94% RICA in 2016.  Needs repeat Dopplers.  Diabetes mellitus-refuses treatment-long discussion about diabetes and heart disease. He says he's ready to die  Medication Adjustments/Labs and Tests Ordered: Current medicines are reviewed at length with the patient today.  Concerns regarding medicines are outlined above.  Medication changes, Labs and Tests ordered today are listed in the Patient Instructions below. Patient Instructions  Medication Instructions:  Your physician has recommended you make the following change in your medication:   START: ezetimibe (zetia) 10 mg once a day   If you need a refill on your cardiac medications before  your next appointment, please call your pharmacy.   Lab work: Your physician recommends that you return for a FASTING lipid profile and liver function panel in 3 months   If you have labs (blood work) drawn today and your tests are completely normal, you will receive your results only by: Marland Kitchen MyChart Message (if you have MyChart) OR . A paper copy in the mail If you have any lab test that is abnormal or we need to change your treatment, we will call you to review the results.  Testing/Procedures: Your physician has requested that you have a carotid duplex. This test is an ultrasound of the carotid arteries in your neck. It looks at blood flow through these arteries that supply the brain with blood. Allow one hour for this exam. There are no restrictions or special instructions.  Follow-Up: At [facility], you and your health needs are our priority.  As part of our continuing  mission to provide you with exceptional heart care, we have created designated Provider Care Teams.  These Care Teams include your primary Cardiologist (physician) and Advanced Practice Providers (APPs -  Physician Assistants and Nurse Practitioners) who all work together to provide you with the care you need, when you need it. . You will need a follow up appointment in 6 months.  Please call our office 2 months in advance to schedule this appointment.  You may see Darlina Guys, MD or one of the following Advanced Practice Providers on your designated Care Team:   . Lyda Jester, PA-C . Dayna Dunn, PA-C . Ermalinda Barrios, PA-C  Any Other Special Instructions Will Be Listed Below (If Applicable).       Sumner Boast, PA-C  09/25/2018 8:24 AM    [facility] [address], [place], [place]  [id number] Phone: [phone]; Fax: [phone]

## 2018-09-25 ENCOUNTER — Encounter: Payer: Self-pay | Admitting: Physician Assistant

## 2018-09-25 ENCOUNTER — Ambulatory Visit: Payer: Medicare Other | Admitting: Physician Assistant

## 2018-09-25 ENCOUNTER — Encounter (INDEPENDENT_AMBULATORY_CARE_PROVIDER_SITE_OTHER): Payer: Self-pay

## 2018-09-25 VITALS — BP 128/76 | HR 74 | Ht 69.5 in | Wt 190.0 lb

## 2018-09-25 DIAGNOSIS — I2510 Atherosclerotic heart disease of native coronary artery without angina pectoris: Secondary | ICD-10-CM

## 2018-09-25 DIAGNOSIS — I4819 Other persistent atrial fibrillation: Secondary | ICD-10-CM | POA: Diagnosis not present

## 2018-09-25 DIAGNOSIS — I10 Essential (primary) hypertension: Secondary | ICD-10-CM

## 2018-09-25 DIAGNOSIS — I6522 Occlusion and stenosis of left carotid artery: Secondary | ICD-10-CM

## 2018-09-25 DIAGNOSIS — E7800 Pure hypercholesterolemia, unspecified: Secondary | ICD-10-CM

## 2018-09-25 DIAGNOSIS — E1142 Type 2 diabetes mellitus with diabetic polyneuropathy: Secondary | ICD-10-CM

## 2018-09-25 MED ORDER — EZETIMIBE 10 MG PO TABS: 10.0000 mg | ORAL_TABLET | Freq: Every day | ORAL | 11 refills | Status: DC

## 2018-09-25 NOTE — Patient Instructions (Signed)
Medication Instructions:  Your physician has recommended you make the following change in your medication:   START: ezetimibe (zetia) 10 mg once a day   If you need a refill on your cardiac medications before your next appointment, please call your pharmacy.   Lab work: Your physician recommends that you return for a FASTING lipid profile and liver function panel in 3 months   If you have labs (blood work) drawn today and your tests are completely normal, you will receive your results only by: Marland Kitchen MyChart Message (if you have MyChart) OR . A paper copy in the mail If you have any lab test that is abnormal or we need to change your treatment, we will call you to review the results.  Testing/Procedures: Your physician has requested that you have a carotid duplex. This test is an ultrasound of the carotid arteries in your neck. It looks at blood flow through these arteries that supply the brain with blood. Allow one hour for this exam. There are no restrictions or special instructions.  Follow-Up: At [facility], you and your health needs are our priority.  As part of our continuing mission to provide you with exceptional heart care, we have created designated Provider Care Teams.  These Care Teams include your primary Cardiologist (physician) and Advanced Practice Providers (APPs -  Physician Assistants and Nurse Practitioners) who all work together to provide you with the care you need, when you need it. . You will need a follow up appointment in 6 months.  Please call our office 2 months in advance to schedule this appointment.  You may see Darlina Guys, MD or one of the following Advanced Practice Providers on your designated Care Team:   . Lyda Jester, PA-C . Dayna Dunn, PA-C . Ermalinda Barrios, PA-C  Any Other Special Instructions Will Be Listed Below (If Applicable).

## 2018-09-30 ENCOUNTER — Ambulatory Visit (HOSPITAL_COMMUNITY)
Admission: RE | Admit: 2018-09-30 | Discharge: 2018-09-30 | Disposition: A | Discharge status: Home / Self Care | Payer: Medicare Other | Source: Ambulatory Visit | Attending: Cardiology | Admitting: Cardiology

## 2018-09-30 DIAGNOSIS — I6522 Occlusion and stenosis of left carotid artery: Secondary | ICD-10-CM | POA: Diagnosis not present

## 2018-10-01 ENCOUNTER — Telehealth: Payer: Self-pay

## 2018-10-01 DIAGNOSIS — I6522 Occlusion and stenosis of left carotid artery: Secondary | ICD-10-CM

## 2018-10-01 NOTE — Telephone Encounter (Addendum)
Attempted to contact patient but there was no answer. Will try again later.  Discussed with Ermalinda Barrios, PA and she states that he will not start a statin but recommends patient to start his zetia if he has not already.

## 2018-10-01 NOTE — Telephone Encounter (Signed)
-----   Message from Imogene Burn, [place] sent at 09/30/2018 11:57 AM EST ----- Carotid show 60 to 79% left ICA a little progressed from last carotid study.  They recommend repeat carotids in 1 year.  Recommend he start study if he has not already.

## 2018-10-08 NOTE — Telephone Encounter (Signed)
Spoke with patient and made him aware of his carotid US results. Patient states that he has started the zetia and has a lab appointment on 4/8. Patient is not willing to start a statin and will not consider PCSK9i. Made patient aware that we will wait and see what his labs show in April and will plan for repeat Carotid US in 1 year. Patient verbalized understanding and thanked me for the call. Repeat study ordered for 1 year.

## 2018-12-25 ENCOUNTER — Other Ambulatory Visit: Payer: Medicare Other

## 2019-02-18 ENCOUNTER — Inpatient Hospital Stay (HOSPITAL_COMMUNITY)
Admission: EM | Admit: 2019-02-18 | Discharge: 2019-02-20 | DRG: 247 | Disposition: A | Discharge status: Home / Self Care | Payer: Medicare Other | Attending: Cardiovascular Disease | Admitting: Cardiovascular Disease

## 2019-02-18 ENCOUNTER — Encounter (HOSPITAL_COMMUNITY): Payer: Self-pay | Admitting: Family Medicine

## 2019-02-18 ENCOUNTER — Other Ambulatory Visit: Payer: Self-pay

## 2019-02-18 ENCOUNTER — Emergency Department (HOSPITAL_COMMUNITY): Payer: Medicare Other

## 2019-02-18 DIAGNOSIS — J449 Chronic obstructive pulmonary disease, unspecified: Secondary | ICD-10-CM | POA: Diagnosis present

## 2019-02-18 DIAGNOSIS — I25110 Atherosclerotic heart disease of native coronary artery with unstable angina pectoris: Secondary | ICD-10-CM | POA: Diagnosis present

## 2019-02-18 DIAGNOSIS — I4819 Other persistent atrial fibrillation: Secondary | ICD-10-CM

## 2019-02-18 DIAGNOSIS — I4821 Permanent atrial fibrillation: Secondary | ICD-10-CM | POA: Diagnosis present

## 2019-02-18 DIAGNOSIS — Z9109 Other allergy status, other than to drugs and biological substances: Secondary | ICD-10-CM

## 2019-02-18 DIAGNOSIS — I6529 Occlusion and stenosis of unspecified carotid artery: Secondary | ICD-10-CM | POA: Diagnosis present

## 2019-02-18 DIAGNOSIS — E1142 Type 2 diabetes mellitus with diabetic polyneuropathy: Secondary | ICD-10-CM | POA: Diagnosis present

## 2019-02-18 DIAGNOSIS — Z20828 Contact with and (suspected) exposure to other viral communicable diseases: Secondary | ICD-10-CM | POA: Diagnosis present

## 2019-02-18 DIAGNOSIS — I200 Unstable angina: Secondary | ICD-10-CM | POA: Diagnosis present

## 2019-02-18 DIAGNOSIS — E7800 Pure hypercholesterolemia, unspecified: Secondary | ICD-10-CM | POA: Diagnosis present

## 2019-02-18 DIAGNOSIS — I340 Nonrheumatic mitral (valve) insufficiency: Secondary | ICD-10-CM | POA: Diagnosis not present

## 2019-02-18 DIAGNOSIS — K219 Gastro-esophageal reflux disease without esophagitis: Secondary | ICD-10-CM | POA: Diagnosis present

## 2019-02-18 DIAGNOSIS — Z833 Family history of diabetes mellitus: Secondary | ICD-10-CM

## 2019-02-18 DIAGNOSIS — I10 Essential (primary) hypertension: Secondary | ICD-10-CM | POA: Diagnosis present

## 2019-02-18 DIAGNOSIS — Z8546 Personal history of malignant neoplasm of prostate: Secondary | ICD-10-CM

## 2019-02-18 DIAGNOSIS — I25810 Atherosclerosis of coronary artery bypass graft(s) without angina pectoris: Secondary | ICD-10-CM | POA: Diagnosis not present

## 2019-02-18 DIAGNOSIS — E118 Type 2 diabetes mellitus with unspecified complications: Secondary | ICD-10-CM

## 2019-02-18 DIAGNOSIS — T82855A Stenosis of coronary artery stent, initial encounter: Secondary | ICD-10-CM | POA: Diagnosis present

## 2019-02-18 DIAGNOSIS — I25710 Atherosclerosis of autologous vein coronary artery bypass graft(s) with unstable angina pectoris: Secondary | ICD-10-CM | POA: Diagnosis present

## 2019-02-18 DIAGNOSIS — E785 Hyperlipidemia, unspecified: Secondary | ICD-10-CM | POA: Diagnosis present

## 2019-02-18 DIAGNOSIS — Z79899 Other long term (current) drug therapy: Secondary | ICD-10-CM | POA: Diagnosis not present

## 2019-02-18 DIAGNOSIS — I4891 Unspecified atrial fibrillation: Secondary | ICD-10-CM | POA: Diagnosis present

## 2019-02-18 DIAGNOSIS — Y831 Surgical operation with implant of artificial internal device as the cause of abnormal reaction of the patient, or of later complication, without mention of misadventure at the time of the procedure: Secondary | ICD-10-CM | POA: Diagnosis present

## 2019-02-18 DIAGNOSIS — Z7982 Long term (current) use of aspirin: Secondary | ICD-10-CM | POA: Diagnosis not present

## 2019-02-18 DIAGNOSIS — I214 Non-ST elevation (NSTEMI) myocardial infarction: Secondary | ICD-10-CM | POA: Diagnosis present

## 2019-02-18 DIAGNOSIS — D696 Thrombocytopenia, unspecified: Secondary | ICD-10-CM | POA: Diagnosis present

## 2019-02-18 LAB — PROTIME-INR
INR: 1.2 (ref 0.8–1.2)
Prothrombin Time: 14.7 seconds (ref 11.4–15.2)

## 2019-02-18 LAB — COMPREHENSIVE METABOLIC PANEL
ALT: 43 U/L (ref 0–44)
AST: 48 U/L — ABNORMAL HIGH (ref 15–41)
Albumin: 4.1 g/dL (ref 3.5–5.0)
Alkaline Phosphatase: 37 U/L — ABNORMAL LOW (ref 38–126)
Anion gap: 10 (ref 5–15)
BUN: 29 mg/dL — ABNORMAL HIGH (ref 8–23)
CO2: 24 mmol/L (ref 22–32)
Calcium: 9.1 mg/dL (ref 8.9–10.3)
Chloride: 102 mmol/L (ref 98–111)
Creatinine, Ser: 1.26 mg/dL — ABNORMAL HIGH (ref 0.61–1.24)
GFR calc Af Amer: 59 mL/min — ABNORMAL LOW (ref >60)
GFR calc non Af Amer: 51 mL/min — ABNORMAL LOW (ref >60)
Glucose, Bld: 138 mg/dL — ABNORMAL HIGH (ref 70–99)
Potassium: 3.6 mmol/L (ref 3.5–5.1)
Sodium: 136 mmol/L (ref 135–145)
Total Bilirubin: 1 mg/dL (ref 0.3–1.2)
Total Protein: 7.3 g/dL (ref 6.5–8.1)

## 2019-02-18 LAB — CBC WITH DIFFERENTIAL/PLATELET
Abs Immature Granulocytes: 0.02 10*3/uL (ref 0.00–0.07)
Basophils Absolute: 0 10*3/uL (ref 0.0–0.1)
Basophils Relative: 0 %
Eosinophils Absolute: 0.1 10*3/uL (ref 0.0–0.5)
Eosinophils Relative: 2 %
HCT: 39.3 % (ref 39.0–52.0)
Hemoglobin: 13.4 g/dL (ref 13.0–17.0)
Immature Granulocytes: 0 %
Lymphocytes Relative: 23 %
Lymphs Abs: 1.2 10*3/uL (ref 0.7–4.0)
MCH: 31.4 pg (ref 26.0–34.0)
MCHC: 34.1 g/dL (ref 30.0–36.0)
MCV: 92 fL (ref 80.0–100.0)
Monocytes Absolute: 0.4 10*3/uL (ref 0.1–1.0)
Monocytes Relative: 8 %
Neutro Abs: 3.5 10*3/uL (ref 1.7–7.7)
Neutrophils Relative %: 67 %
Platelets: 113 10*3/uL — ABNORMAL LOW (ref 150–400)
RBC: 4.27 MIL/uL (ref 4.22–5.81)
RDW: 12.8 % (ref 11.5–15.5)
WBC: 5.3 10*3/uL (ref 4.0–10.5)
nRBC: 0 % (ref 0.0–0.2)

## 2019-02-18 LAB — HEMOGLOBIN A1C
Hgb A1c MFr Bld: 7.6 % — ABNORMAL HIGH (ref 4.8–5.6)
Mean Plasma Glucose: 171.42 mg/dL

## 2019-02-18 LAB — TROPONIN I
Troponin I: 0.37 ng/mL (ref <0.03)
Troponin I: 0.37 ng/mL (ref <0.03)

## 2019-02-18 LAB — APTT: aPTT: 35 seconds (ref 24–36)

## 2019-02-18 LAB — MAGNESIUM: Magnesium: 1.8 mg/dL (ref 1.7–2.4)

## 2019-02-18 LAB — CBG MONITORING, ED: Glucose-Capillary: 127 mg/dL — ABNORMAL HIGH (ref 70–99)

## 2019-02-18 LAB — SARS CORONAVIRUS 2 BY RT PCR (HOSPITAL ORDER, PERFORMED IN ~~LOC~~ HOSPITAL LAB): SARS Coronavirus 2: NEGATIVE

## 2019-02-18 LAB — BRAIN NATRIURETIC PEPTIDE: B Natriuretic Peptide: 204.7 pg/mL — ABNORMAL HIGH (ref 0.0–100.0)

## 2019-02-18 MED ORDER — METOPROLOL SUCCINATE ER 25 MG PO TB24
12.5000 mg | ORAL_TABLET | Freq: Every evening | ORAL | Status: DC
  Administered 2019-02-18 – 2019-02-19 (×2): 12.5 mg via ORAL
  Filled 2019-02-18 (×2): qty 1

## 2019-02-18 MED ORDER — VITAMIN B-12 100 MCG PO TABS
100.0000 ug | ORAL_TABLET | Freq: Every day | ORAL | Status: DC
  Administered 2019-02-19 – 2019-02-20 (×2): 100 ug via ORAL
  Filled 2019-02-18 (×2): qty 1

## 2019-02-18 MED ORDER — METOPROLOL SUCCINATE ER 25 MG PO TB24: 12.5000 mg | ORAL_TABLET | Freq: Every evening | ORAL | Status: DC

## 2019-02-18 MED ORDER — EZETIMIBE 10 MG PO TABS
10.0000 mg | ORAL_TABLET | Freq: Every day | ORAL | Status: DC
  Administered 2019-02-19 – 2019-02-20 (×2): 10 mg via ORAL
  Filled 2019-02-18 (×2): qty 1

## 2019-02-18 MED ORDER — ASPIRIN EC 81 MG PO TBEC
81.0000 mg | DELAYED_RELEASE_TABLET | Freq: Every day | ORAL | Status: DC
  Administered 2019-02-20: 81 mg via ORAL
  Filled 2019-02-18 (×2): qty 1

## 2019-02-18 MED ORDER — ASPIRIN 81 MG PO CHEW: 324.0000 mg | CHEWABLE_TABLET | ORAL | Status: AC

## 2019-02-18 MED ORDER — METOPROLOL SUCCINATE ER 25 MG PO TB24: 25.0000 mg | ORAL_TABLET | Freq: Every evening | ORAL | Status: DC

## 2019-02-18 MED ORDER — HEPARIN BOLUS VIA INFUSION
4000.0000 [IU] | Freq: Once | INTRAVENOUS | Status: AC
  Administered 2019-02-18: 4000 [IU] via INTRAVENOUS
  Filled 2019-02-18: qty 4000

## 2019-02-18 MED ORDER — HEPARIN (PORCINE) 25000 UT/250ML-% IV SOLN
1000.0000 [IU]/h | INTRAVENOUS | Status: DC
  Administered 2019-02-18: 1000 [IU]/h via INTRAVENOUS
  Filled 2019-02-18: qty 250

## 2019-02-18 MED ORDER — ASPIRIN 300 MG RE SUPP: 300.0000 mg | RECTAL | Status: AC

## 2019-02-18 MED ORDER — ONDANSETRON HCL 4 MG/2ML IJ SOLN: 4.0000 mg | Freq: Four times a day (QID) | INTRAMUSCULAR | Status: DC | PRN

## 2019-02-18 MED ORDER — ASPIRIN 81 MG PO CHEW
324.0000 mg | CHEWABLE_TABLET | Freq: Once | ORAL | Status: AC
  Administered 2019-02-18: 324 mg via ORAL
  Filled 2019-02-18: qty 4

## 2019-02-18 NOTE — ED Provider Notes (Signed)
[facility] DEPT Provider Note   CSN: [id number] Arrival date & time: 02/18/19  1347    History   Chief Complaint Chief Complaint  Patient presents with  . Chest Pain    HPI William Weber is a 85 y.o. male.     HPI Presents with chest pain. Patient has a history of CAD, including CABG, stent placement, takes aspirin daily, but no Plavix or other blood thinning medication.  He notes that over the past 2 weeks he has had exertional chest pain. It characteristically is the same, and that it is worse, burning, sternal, exacerbated with activity, better at rest. Currently has no pain, nor has he had any today. He notes that he has done no exertion earlier today either. No fever, no chills There is dyspnea associated with chest pain.  Past Medical History:  Diagnosis Date  . Anxiety    mild  . Atrial fibrillation ([place])   . CAD (coronary artery disease)    DES proximal circumflex January, 2009  /   nuclear, December, 2011, no ischemia, ejection fraction 70%  . Carotid artery disease ([place])    Doppler, August, 2011, zero 64% RIC A., 40-34% LICA, stable  . Chronic headaches   . COPD (chronic obstructive pulmonary disease) ([place])   . Diabetic peripheral neuropathy ([place]) 01/22/2018  . Dyslipidemia    statin intolerance  . Ejection fraction    EF 70%, nuclear, December, 2011  . GERD (gastroesophageal reflux disease)   . Hiatal hernia   . Hx of CABG    Hendrickson, 2002  . Hypercholesterolemia   . Hypertension   . MGUS (monoclonal gammopathy of unknown significance) 01/22/2018  . Prostate cancer ([place])    TUR and implant  . Rash    2009, etiology not clear, not from Plavix, Claritin helped  . Statin intolerance     Patient Active Problem List   Diagnosis Date Noted  . Unstable angina ([place]) 02/18/2019  . MGUS (monoclonal gammopathy of unknown significance) 01/22/2018  . Diabetic peripheral neuropathy ([place]) 01/22/2018  . Thrombocytopenia  ([place]) 10/21/2013  . Elevated serum glucose 10/21/2013  . Atrial fibrillation ([place])   . CAD (coronary artery disease)   . Hypertension   . Dyslipidemia   . Statin intolerance   . COPD (chronic obstructive pulmonary disease) ([place])   . Anxiety   . GERD (gastroesophageal reflux disease)   . Chronic headaches   . Prostate cancer ([place])   . Ejection fraction   . Hx of CABG   . Carotid artery disease ([place])   . Rash     Past Surgical History:  Procedure Laterality Date  . BLADDER SURGERY    . CARDIAC SURGERY    . PROSTATE SURGERY          Home Medications    Prior to Admission medications   Medication Sig Start Date End Date Taking? Authorizing Provider  Ascorbic Acid (VITAMIN C WITH ROSE HIPS) 1000 MG tablet Take 1,000 mg by mouth daily.   Yes [provider]  aspirin 81 MG tablet Take 81 mg by mouth daily.   Yes [provider]  DiphenhydrAMINE HCl (BENADRYL ALLERGY PO) Take 1 tablet by mouth at bedtime.    Yes [provider]  ezetimibe (ZETIA) 10 MG tablet Take 1 tablet (10 mg total) by mouth daily. 09/25/18  Yes Imogene Burn, PA-C  fish oil-omega-3 fatty acids 1000 MG capsule Take 1-2 g by mouth 2 (two) times a day. Take  2 capsules in the morning and Take 1 capsule in the evening   Yes [provider]  lansoprazole (PREVACID) 30 MG capsule Take 30 mg by mouth daily at 12 noon.   Yes [provider]  triamterene-hydrochlorothiazide (DYAZIDE) 37.5-25 MG per capsule Take 1 capsule by mouth daily.  09/07/11  Yes [provider]  vitamin B-12 (CYANOCOBALAMIN) 100 MCG tablet Take 100 mcg by mouth daily.   Yes [provider]  metoprolol (TOPROL-XL) 50 MG 24 hr tablet Take 25 mg by mouth every evening.  08/28/11   [provider]    Family History Family History  Problem Relation Age of Onset  . Diabetes Sister   . Diabetes Brother     Social History Social History   Tobacco Use  . Smoking status:  Never Smoker  . Smokeless tobacco: Never Used  Substance Use Topics  . Alcohol use: No    Frequency: Never  . Drug use: No     Allergies   Allopurinol; Metformin and related; Percocet [oxycodone-acetaminophen]; Rabeprazole sodium; and Statins   Review of Systems Review of Systems  Constitutional:       Per HPI, otherwise negative  HENT:       Per HPI, otherwise negative  Respiratory:       Per HPI, otherwise negative  Cardiovascular:       Per HPI, otherwise negative  Gastrointestinal: Negative for vomiting.  Endocrine:       Negative aside from HPI  Genitourinary:       Neg aside from HPI   Musculoskeletal:       Per HPI, otherwise negative  Skin: Negative.   Neurological: Negative for syncope.     Physical Exam Updated Vital Signs BP (!) 147/78 (BP Location: Left Arm)   Pulse 67   Temp 98.7 F (37.1 C) (Oral)   Resp 17   Ht 5\' 9"  (1.753 m)   Wt 85.3 kg   SpO2 97%   BMI 27.76 kg/m   Physical Exam Vitals signs and nursing note reviewed.  Constitutional:      General: He is not in acute distress.    Appearance: He is well-developed.  HENT:     Head: Normocephalic and atraumatic.  Eyes:     Conjunctiva/sclera: Conjunctivae normal.  Cardiovascular:     Rate and Rhythm: Normal rate and regular rhythm.  Pulmonary:     Effort: Pulmonary effort is normal. No respiratory distress.     Breath sounds: No stridor.  Abdominal:     General: There is no distension.  Skin:    General: Skin is warm and dry.  Neurological:     Mental Status: He is alert and oriented to person, place, and time.      ED Treatments / Results  Labs (all labs ordered are listed, but only abnormal results are displayed) Labs Reviewed  COMPREHENSIVE METABOLIC PANEL - Abnormal; Notable for the following components:      Result Value   Glucose, Bld 138 (*)    BUN 29 (*)    Creatinine, Ser 1.26 (*)    AST 48 (*)    Alkaline Phosphatase 37 (*)    GFR calc non Af Amer 51 (*)     GFR calc Af Amer 59 (*)    All other components within normal limits  TROPONIN I - Abnormal; Notable for the following components:   Troponin I 0.37 (*)    All other components within normal limits  BRAIN NATRIURETIC  PEPTIDE - Abnormal; Notable for the following components:   B Natriuretic Peptide 204.7 (*)    All other components within normal limits  CBC WITH DIFFERENTIAL/PLATELET - Abnormal; Notable for the following components:   Platelets 113 (*)    All other components within normal limits  CBG MONITORING, ED - Abnormal; Notable for the following components:   Glucose-Capillary 127 (*)    All other components within normal limits  SARS CORONAVIRUS 2 (HOSPITAL ORDER, [facility])  MAGNESIUM  PROTIME-INR  APTT    EKG EKG Interpretation  Date/Time:  Tuesday February 18 2019 15:46:06 EDT Ventricular Rate:  77 PR Interval:    QRS Duration: 93 QT Interval:  417 QTC Calculation: 472 R Axis:   78 Text Interpretation:  Atrial fibrillation with some sinus periods RSR' in V1 or V2, right VCD or RVH similar to prior Abnormal ekg Confirmed by Carmin Muskrat [phone]) on 02/18/2019 4:03:05 PM   Radiology Dg Chest Portable 1 View  Result Date: 02/18/2019 CLINICAL DATA:  Chest pain EXAM: PORTABLE CHEST 1 VIEW COMPARISON:  October 01, 2007 FINDINGS: No edema or consolidation. Heart is upper normal in size with pulmonary vascularity normal. Patient is status post coronary artery bypass grafting. There is aortic atherosclerosis. No adenopathy. No bone lesions. IMPRESSION: No edema or consolidation. Heart upper normal in size. Postoperative changes noted. Aortic Atherosclerosis (ICD10-I70.0). Electronically Signed   By: Lowella Grip III [name]   On: 02/18/2019 15:55    Procedures Procedures (including critical care time)  Medications Ordered in ED Medications  aspirin chewable tablet 324 mg (324 mg Oral Given 02/18/19 1528)     Initial Impression / Assessment and  Plan / ED Course  I have reviewed the triage vital signs and the nursing notes.  Pertinent labs & imaging results that were available during my care of the patient were reviewed by me and considered in my medical decision making (see chart for details).        6:29 PM Patient aware of all findings. Labs previously reviewed, and discussed with her cardiology colleagues, concern for elevated troponin. Patient continues to have no pain, is hemodynamically unremarkable We discussed concern for elevated troponin, unstable angina, and given his risk profile including prior CABG, stent, absence of medication be on aspirin for CAD, the patient will require transfer to our affiliated center, initiation of heparin.  Final Clinical Impressions(s) / ED Diagnoses   Final diagnoses:  Unstable angina ([place])   CRITICAL CARE Performed by: Carmin Muskrat Total critical care time: 35 minutes Critical care time was exclusive of separately billable procedures and treating other patients. Critical care was necessary to treat or prevent imminent or life-threatening deterioration. Critical care was time spent personally by me on the following activities: development of treatment plan with patient and/or surrogate as well as nursing, discussions with consultants, evaluation of patient's response to treatment, examination of patient, obtaining history from patient or surrogate, ordering and performing treatments and interventions, ordering and review of laboratory studies, ordering and review of radiographic studies, pulse oximetry and re-evaluation of patient's condition.    Carmin Muskrat, MD 02/18/19 [phone]

## 2019-02-18 NOTE — Progress Notes (Signed)
ANTICOAGULATION CONSULT NOTE - Initial Consult  Pharmacy Consult for heparin Indication: chest pain/ACS  Allergies  Allergen Reactions  . Allopurinol Other (See Comments)    Painful skin nodules  . Metformin And Related Diarrhea    GAS  . Percocet [Oxycodone-Acetaminophen]   . Rabeprazole Sodium   . Statins     Hmg-Coa Reductase inhibitors, Myalgias (intolerance)    Patient Measurements: Height: 5\' 9"  (175.3 cm) Weight: 188 lb (85.3 kg) IBW/kg (Calculated) : 70.7 Heparin Dosing Weight = TBW = 85 kg  Vital Signs: Temp: 98.7 F (37.1 C) (06/02 1406) Temp Source: Oral (06/02 1406) BP: 126/80 (06/02 1800) Pulse Rate: 47 (06/02 1745)  Labs: Recent Labs    02/18/19 1635  HGB 13.4  HCT 39.3  PLT 113*  LABPROT 14.7  INR 1.2  CREATININE 1.26*  TROPONINI 0.37*    Estimated Creatinine Clearance: 44.7 mL/min (A) (by C-G formula based on SCr of 1.26 mg/dL (H)).   Medical History: Past Medical History:  Diagnosis Date  . Anxiety    mild  . Atrial fibrillation ([place])   . CAD (coronary artery disease)    DES proximal circumflex January, 2009  /   nuclear, December, 2011, no ischemia, ejection fraction 70%  . Carotid artery disease ([place])    Doppler, August, 2011, zero 78% RIC A., 24-23% LICA, stable  . Chronic headaches   . COPD (chronic obstructive pulmonary disease) ([place])   . Diabetic peripheral neuropathy ([place]) 01/22/2018  . Dyslipidemia    statin intolerance  . Ejection fraction    EF 70%, nuclear, December, 2011  . GERD (gastroesophageal reflux disease)   . Hiatal hernia   . Hx of CABG    Hendrickson, 2002  . Hypercholesterolemia   . Hypertension   . MGUS (monoclonal gammopathy of unknown significance) 01/22/2018  . Prostate cancer ([place])    TUR and implant  . Rash    2009, etiology not clear, not from Plavix, Claritin helped  . Statin intolerance    Assessment: Pharmacy consulted to dose and monitor heparin for ACS in this 85 year old male presenting with  chest pain. Pt has PMH significant for CAD, CABG, stent placement on ASA daily. Patient is not on anticoagulants PTA. Baseline labs ordered.  Patient to be transferred to [facility] for cardiac cath.   Today, 02/18/19  Hgb 13.4 - WNL  Plt 113 - low  INR 1.2  Troponin 0.37  Baseline aPTT ordered  Goal of Therapy:  Heparin level 0.3-0.7 units/ml Monitor platelets by anticoagulation protocol: Yes   Plan:  Give 4000 units bolus x 1 Start heparin infusion at 1000 units/hr Check anti-Xa level in 8 hours and daily while on heparin Continue to monitor H&H and platelets  Lenis Noon, PharmD 02/18/2019,6:35 PM

## 2019-02-18 NOTE — ED Notes (Signed)
ED TO INPATIENT HANDOFF REPORT  ED Nurse Name and Phone #: [phone]  S Name/Age/Gender William Weber 85 y.o. male Room/Bed: WA02/WA02  Code Status   Code Status: Not on file  Home/SNF/Other Home Patient oriented to: self, place, time and situation Is this baseline? Yes   Triage Complete: Triage complete  Chief Complaint chest pain   Triage Note Patient is complaining of mid-sternal and left chest pain that radiates to his left arm intermittent over the last week. Associated symptoms shortness of breath, dizziness, and lightheadedness. Described as pressure and soreness.    Allergies Allergies  Allergen Reactions  . Allopurinol Other (See Comments)    Painful skin nodules  . Metformin And Related Diarrhea    GAS  . Percocet [Oxycodone-Acetaminophen]   . Rabeprazole Sodium   . Statins     Hmg-Coa Reductase inhibitors, Myalgias (intolerance)    Level of Care/Admitting Diagnosis ED Disposition    ED Disposition Condition [facility]: [place] [[id number]]  Level of Care: Telemetry Cardiac [103]  Covid Evaluation: Screening Protocol (No Symptoms)  Diagnosis: Unstable angina [facility]) [[id number]]  Admitting Physician: Lucia Gaskins  Attending Physician: Thayer Headings [phone]  Estimated length of stay: past midnight tomorrow  Certification:: I certify this patient will need inpatient services for at least 2 midnights  PT Class (Do Not Modify): Inpatient [101]  PT Acc Code (Do Not Modify): Private [1]       B Medical/Surgery History Past Medical History:  Diagnosis Date  . Anxiety    mild  . Atrial fibrillation ([place])   . CAD (coronary artery disease)    DES proximal circumflex January, 2009  /   nuclear, December, 2011, no ischemia, ejection fraction 70%  . Carotid artery disease ([place])    Doppler, August, 2011, zero 02% RIC A., 40-97% LICA, stable  . Chronic headaches   . COPD (chronic obstructive pulmonary  disease) ([place])   . Diabetic peripheral neuropathy ([place]) 01/22/2018  . Dyslipidemia    statin intolerance  . Ejection fraction    EF 70%, nuclear, December, 2011  . GERD (gastroesophageal reflux disease)   . Hiatal hernia   . Hx of CABG    Hendrickson, 2002  . Hypercholesterolemia   . Hypertension   . MGUS (monoclonal gammopathy of unknown significance) 01/22/2018  . Prostate cancer ([place])    TUR and implant  . Rash    2009, etiology not clear, not from Plavix, Claritin helped  . Statin intolerance    Past Surgical History:  Procedure Laterality Date  . BLADDER SURGERY    . CARDIAC SURGERY    . PROSTATE SURGERY       A IV Location/Drains/Wounds Patient Lines/Drains/Airways Status   Active Line/Drains/Airways    Name:   Placement date:   Placement time:   Site:   Days:   Peripheral IV 02/18/19 Left Forearm   02/18/19    1637    Forearm   less than 1          Intake/Output Last 24 hours No intake or output data in the 24 hours ending 02/18/19 1832  Labs/Imaging Results for orders placed or performed during the hospital encounter of 02/18/19 (from the past 48 hour(s))  CBG monitoring, ED     Status: Abnormal   Collection Time: 02/18/19  4:20 PM  Result Value Ref Range   Glucose-Capillary 127 (H) 70 - 99 mg/dL  Comprehensive metabolic panel  Status: Abnormal   Collection Time: 02/18/19  4:35 PM  Result Value Ref Range   Sodium 136 135 - 145 mmol/L   Potassium 3.6 3.5 - 5.1 mmol/L   Chloride 102 98 - 111 mmol/L   CO2 24 22 - 32 mmol/L   Glucose, Bld 138 (H) 70 - 99 mg/dL   BUN 29 (H) 8 - 23 mg/dL   Creatinine, Ser 1.26 (H) 0.61 - 1.24 mg/dL   Calcium 9.1 8.9 - 10.3 mg/dL   Total Protein 7.3 6.5 - 8.1 g/dL   Albumin 4.1 3.5 - 5.0 g/dL   AST 48 (H) 15 - 41 U/L   ALT 43 0 - 44 U/L   Alkaline Phosphatase 37 (L) 38 - 126 U/L   Total Bilirubin 1.0 0.3 - 1.2 mg/dL   GFR calc non Af Amer 51 (L) >60 mL/min   GFR calc Af Amer 59 (L) >60 mL/min   Anion gap 10 5 - 15     Comment: Performed at [facility], [place] [address] [place], [place] [address]  Magnesium     Status: None   Collection Time: 02/18/19  4:35 PM  Result Value Ref Range   Magnesium 1.8 1.7 - 2.4 mg/dL    Comment: Performed at [facility], [place] [address] [place], [place] [address]  Troponin I - Once     Status: Abnormal   Collection Time: 02/18/19  4:35 PM  Result Value Ref Range   Troponin I 0.37 (HH) <0.03 ng/mL    Comment: CRITICAL RESULT CALLED TO, READ BACK BY AND VERIFIED WITH: S.WEST AT 1729 ON 02/18/19 BY N.THOMPSON Performed at [facility], [place] [address], [place], [place] [address]   Brain natriuretic peptide (order if patient c/o SOB ONLY)     Status: Abnormal   Collection Time: 02/18/19  4:35 PM  Result Value Ref Range   B Natriuretic Peptide 204.7 (H) 0.0 - 100.0 pg/mL    Comment: Performed at [facility], [place] [address] [place], [place] [address]  CBC with Differential/Platelet     Status: Abnormal   Collection Time: 02/18/19  4:35 PM  Result Value Ref Range   WBC 5.3 4.0 - 10.5 K/uL   RBC 4.27 4.22 - 5.81 MIL/uL   Hemoglobin 13.4 13.0 - 17.0 g/dL   HCT 39.3 39.0 - 52.0 %   MCV 92.0 80.0 - 100.0 fL   MCH 31.4 26.0 - 34.0 pg   MCHC 34.1 30.0 - 36.0 g/dL   RDW 12.8 11.5 - 15.5 %   Platelets 113 (L) 150 - 400 K/uL    Comment: REPEATED TO VERIFY PLATELET COUNT CONFIRMED BY SMEAR SPECIMEN CHECKED FOR CLOTS Immature Platelet Fraction may be clinically indicated, consider ordering this additional test [id number]    nRBC 0.0 0.0 - 0.2 %   Neutrophils Relative % 67 %   Neutro Abs 3.5 1.7 - 7.7 K/uL   Lymphocytes Relative 23 %   Lymphs Abs 1.2 0.7 - 4.0 K/uL   Monocytes Relative 8 %   Monocytes Absolute 0.4 0.1 - 1.0 K/uL   Eosinophils Relative 2 %   Eosinophils Absolute 0.1 0.0 - 0.5 K/uL   Basophils Relative 0 %   Basophils Absolute 0.0 0.0 - 0.1 K/uL   Immature Granulocytes 0 %   Abs  Immature Granulocytes 0.02 0.00 - 0.07 K/uL    Comment: Performed at [facility], [place] [address], [place], [place] [address]  Protime-INR  Status: None   Collection Time: 02/18/19  4:35 PM  Result Value Ref Range   Prothrombin Time 14.7 11.4 - 15.2 seconds   INR 1.2 0.8 - 1.2    Comment: (NOTE) INR goal varies based on device and disease states. Performed at [facility], [place] [address] [place], [place] [address]    Dg Chest Portable 1 View  Result Date: 02/18/2019 CLINICAL DATA:  Chest pain EXAM: PORTABLE CHEST 1 VIEW COMPARISON:  October 01, 2007 FINDINGS: No edema or consolidation. Heart is upper normal in size with pulmonary vascularity normal. Patient is status post coronary artery bypass grafting. There is aortic atherosclerosis. No adenopathy. No bone lesions. IMPRESSION: No edema or consolidation. Heart upper normal in size. Postoperative changes noted. Aortic Atherosclerosis (ICD10-I70.0). Electronically Signed   By: Lowella Grip III [name]   On: 02/18/2019 15:55    Pending Labs Unresulted Labs (From admission, onward)    Start     Ordered   02/18/19 1826  APTT  Add-on,   R     02/18/19 1825   02/18/19 1747  SARS Coronavirus 2 (CEPHEID - Performed in [place] hospital lab), [facility] Order  Once,   R    Question:  Rule Out  Answer:  Yes   02/18/19 1746          Vitals/Pain Today's Vitals   02/18/19 1730 02/18/19 1736 02/18/19 1745 02/18/19 1800  BP: 133/66 (!) 147/78  126/80  Pulse: (!) 110 67 (!) 47   Resp: 16 17 13    Temp:      TempSrc:      SpO2: (!) 82% 97% 91%   Weight:      Height:      PainSc:        Isolation Precautions No active isolations  Medications Medications  heparin ADULT infusion 100 units/mL (25000 units/260mL sodium chloride 0.45%) (has no administration in time range)  aspirin chewable tablet 324 mg (324 mg Oral Given 02/18/19 1528)    Mobility walks Low fall risk   Focused  Assessments Cardiac Assessment Handoff:  Cardiac Rhythm: Atrial fibrillation Lab Results  Component Value Date   CKTOTAL 117 10/02/2007   CKMB 2.2 10/02/2007   TROPONINI 0.37 ([place]) 02/18/2019   No results found for: DDIMER Does the Patient currently have chest pain? No     R Recommendations: See Admitting Provider Note  Report given to:   Additional Notes: Troponin elevated

## 2019-02-18 NOTE — H&P (Addendum)
Cardiology Admission History and Physical:   Patient ID: William Weber MRN: [id number]; DOB: [birth year]   Admission date: 02/18/2019  Primary Care Provider: Christain Sacramento, MD Primary Cardiologist: Lauree Chandler, MD  Primary Electrophysiologist:  None   Chief Complaint:  Chest pain  Patient Profile:   William Weber is a 85 y.o. male with history of CAD with prior CABG, afib, HTN, HL, COPD presents with chest pain.   History of Present Illness:   William Weber is an 85 yo male history of CAD with prior CABG (5 vessel in 2002 LIMA-LAD, SVG-D1, SVG-PDA and PLA sequential, SVG-OM1), history of DES to LCX in 2009, persistent Afib but has historically refused anticoag, HTN, HL, COPD, statin intolerance on zetia having refused pcsk9 inhibitors,  presents with chest pain.   States that 1.5 weeks ago he noted gradual onset of intermittent exertion chest pain. Seen by PMD, who recommended ER visit, but given that he was asymptomatic without exertion he declined. Over the last 3 days has been spreading mulch in the yard and chest pain returned. Encouraged by son to seek medical attention. Describes the pain and a central "burning" sensation identical to - but not as severe as - the pain he experienced prior to his CABG. Not associated with lightheadedness or dizziness. Relieved with rest.   In the ER, initial troponin 0.37 with EKG in well controlled AF without ischemic changes. COVID negative. Transferred to [facility] for ischemic evaluation. At the time of my interview, chest pain free, comfortable.   Past Medical History:  Diagnosis Date  . Anxiety    mild  . Atrial fibrillation ([place])   . CAD (coronary artery disease)    DES proximal circumflex January, 2009  /   nuclear, December, 2011, no ischemia, ejection fraction 70%  . Carotid artery disease ([place])    Doppler, August, 2011, zero 70% RIC A., 26-37% LICA, stable  . Chronic headaches   . COPD (chronic obstructive pulmonary  disease) ([place])   . Diabetic peripheral neuropathy ([place]) 01/22/2018  . Dyslipidemia    statin intolerance  . Ejection fraction    EF 70%, nuclear, December, 2011  . GERD (gastroesophageal reflux disease)   . Hiatal hernia   . Hx of CABG    [place], 2002  . Hypercholesterolemia   . Hypertension   . MGUS (monoclonal gammopathy of unknown significance) 01/22/2018  . Prostate cancer ([place])    TUR and implant  . Rash    2009, etiology not clear, not from Plavix, Claritin helped  . Statin intolerance     Past Surgical History:  Procedure Laterality Date  . BLADDER SURGERY    . CARDIAC SURGERY    . PROSTATE SURGERY       Medications Prior to Admission: Prior to Admission medications   Medication Sig Start Date End Date Taking? Authorizing Provider  Ascorbic Acid (VITAMIN C WITH ROSE HIPS) 1000 MG tablet Take 1,000 mg by mouth daily.   Yes [provider]  aspirin 81 MG tablet Take 81 mg by mouth daily.   Yes [provider]  DiphenhydrAMINE HCl (BENADRYL ALLERGY PO) Take 1 tablet by mouth at bedtime.    Yes [provider]  ezetimibe (ZETIA) 10 MG tablet Take 1 tablet (10 mg total) by mouth daily. 09/25/18  Yes Imogene Burn, PA-C  fish oil-omega-3 fatty acids 1000 MG capsule Take 1-2 g by mouth 2 (two) times a day. Take 2 capsules in the morning and Take 1 capsule  in the evening   Yes [provider]  lansoprazole (PREVACID) 30 MG capsule Take 30 mg by mouth daily at 12 noon.   Yes [provider]  triamterene-hydrochlorothiazide (DYAZIDE) 37.5-25 MG per capsule Take 1 capsule by mouth daily.  09/07/11  Yes [provider]  vitamin B-12 (CYANOCOBALAMIN) 100 MCG tablet Take 100 mcg by mouth daily.   Yes [provider]  metoprolol (TOPROL-XL) 50 MG 24 hr tablet Take 25 mg by mouth every evening.  08/28/11   [provider]     Allergies:    Allergies  Allergen Reactions  . Allopurinol Other (See Comments)     Painful skin nodules  . Metformin And Related Diarrhea    GAS  . Percocet [Oxycodone-Acetaminophen]   . Rabeprazole Sodium   . Statins     Hmg-Coa Reductase inhibitors, Myalgias (intolerance)    Social History:   Social History   Socioeconomic History  . Marital status: Married    Spouse name: Not on file  . Number of children: Not on file  . Years of education: Not on file  . Highest education level: Not on file  Occupational History  . Not on file  Social Needs  . Financial resource strain: Not on file  . Food insecurity:    Worry: Not on file    Inability: Not on file  . Transportation needs:    Medical: Not on file    Non-medical: Not on file  Tobacco Use  . Smoking status: Never Smoker  . Smokeless tobacco: Never Used  Substance and Sexual Activity  . Alcohol use: No    Frequency: Never  . Drug use: No  . Sexual activity: Not on file  Lifestyle  . Physical activity:    Days per week: Not on file    Minutes per session: Not on file  . Stress: Not on file  Relationships  . Social connections:    Talks on phone: Not on file    Gets together: Not on file    Attends religious service: Not on file    Active member of club or organization: Not on file    Attends meetings of clubs or organizations: Not on file    Relationship status: Not on file  . Intimate partner violence:    Fear of current or ex partner: Not on file    Emotionally abused: Not on file    Physically abused: Not on file    Forced sexual activity: Not on file  Other Topics Concern  . Not on file  Social History Narrative  . Not on file    Family History:   The patient's family history includes Diabetes in his brother and sister.    ROS:  Please see the history of present illness.  All other ROS reviewed and negative.     Physical Exam/Data:   Vitals:   02/18/19 1730 02/18/19 1736 02/18/19 1745 02/18/19 1800  BP: 133/66 (!) 147/78  126/80  Pulse: (!) 110 67 (!) 47   Resp: 16 17 13     Temp:      TempSrc:      SpO2: (!) 82% 97% 91%   Weight:      Height:       No intake or output data in the 24 hours ending 02/18/19 1843 Last 3 Weights 02/18/2019 09/25/2018 03/14/2018  Weight (lbs) 188 lb 190 lb 187 lb  Weight (kg) 85.276 kg 86.183 kg 84.823 kg  Body mass index is 27.76 kg/m.  General:  Well nourished, well developed, in no acute distress. Appearing younger than stated age.  HEENT: normal Lymph: no adenopathy Neck: no JVD Endocrine:  No thryomegaly Vascular: 2+ radial pulses bilaterally.   Cardiac:  normal S1, S2; RRR; no murmurs.  Lungs:  clear to auscultation bilaterally, no wheezing, rhonchi or rales  Abd: soft, nontender, no hepatomegaly  Ext: no edema Musculoskeletal:  No deformities, BUE and BLE strength normal and equal Skin: warm and dry  Neuro:  CNs 2-12 intact, no focal abnormalities noted Psych:  Normal affect   EKG:  The ECG that was done was personally reviewed and demonstrates rated controlled AF without ischemic changes.   Relevant CV Studies: Carotid Ultrasound 09/2018: Summary: Right Carotid: Velocities in the right ICA are consistent with a 1-39% stenosis.                Non-hemodynamically significant plaque <50% noted in the CCA. The                RICA velocities remain within normal range and stable when                compared to the prior exam.  Left Carotid: Velocities in the left ICA are now consistent with a 60-79%               stenosis. Non-hemodynamically significant plaque noted in the CCA.               The LICA velocities are elevated and have increased compared to               the prior exam.  Vertebrals: Left vertebral artery demonstrates antegrade flow. Small caliber             right vertebral artery with antegrade, atypical flow noted.  TTE 2015: Unable to see results; per verbal report LVEF was 55%  Cardiac cath January 2009:  RESULTS: Left main coronary artery: The left main coronary artery was free of  significant disease.  Left anterior descending artery: The left anterior descending artery gave rise to 2 diagonal branches and then there was competing flow distally. There was a 50% narrowing in the proximal LAD and there was an 80% ostial narrowing in the first diagonal branch.  Circumflex artery: The circumflex artery gave rise to a ramus branch, a marginal branch and 2 posterolateral branches. There was 80% stenosis in the ramus branch. The marginal branch was completely occluded. There was a complex lesion in the proximal circumflex artery which extended over about 15 mm right after a sharp bend in the circumflex artery. There was also an 80% lesion at the ostium of the first of the posterolateral branches.  Right coronary artery: The right coronary artery was a moderate-sized vessel and gave rise to a right ventricular branch, a posterior descending branch and a posterolateral branch. There was a 50% ostial stenosis and a long 50% stenosis in the midportion of the vessel. There was a 90% stenosis in the posterolateral branch and an 80% stenosis in the posterolateral branch. There was competing flow in the posterolateral branch.  The saphenous vein graft to the posterior descending branch of the right coronary was patent and functioned normally. The previous report said there was a graft to the posterior descending branch as well but we were not able to identify that there was a connection on our angiogram today.  The saphenous vein graft to the marginal branch  of the circumflex artery was patent with a 20% narrowing in the proximal portion.  The saphenous vein graft to the diagonal branch of the LAD was patent with luminal irregularities.  The LIMA graft to the LAD was patent and functioned normally.  Left ventriculogram: Left ventriculogram performed in the RAO projection showed good wall motion with no areas of  hypokinesis. The estimated ejection fraction was 60%.  Following stenting of the lesion in the circumflex artery, stenosis improved from 95% to 0%.  CONCLUSION: 1. Coronary artery disease status post prior coronary bypass graft surgery 2002. 2. Severe native vessel disease with 50% stenosis in the proximal LAD, 80% ostial stenosis in the first large diagonal branch, 80% stenosis in a small ramus branch of the circumflex artery, total occlusion of the marginal branch of the circumflex artery, 95% stenosis in the proximal circumflex artery, a 50% ostial and 50% mid stenosis in the right coronary artery with 90 and 80% stenosis in the large posterolateral branch. 3. Patent vein graft to the posterolateral branch of the right coronary artery, patent vein graft to the marginal branch of the circumflex artery with 20% proximal stenosis, patent vein graft to the diagonal branch of the LAD, and patent LIMA graft to the LAD. 4. Normal left ventricular function with estimated ejection fraction of 60%. 5. Successful percutaneous coronary intervention of the lesion in the proximal native circumflex artery using a Promus drug-eluting stent with improvement of thenarrowing from 95% to 0%.  Laboratory Data:  Chemistry Recent Labs  Lab 02/18/19 1635  NA 136  K 3.6  CL 102  CO2 24  GLUCOSE 138*  BUN 29*  CREATININE 1.26*  CALCIUM 9.1  GFRNONAA 51*  GFRAA 59*  ANIONGAP 10    Recent Labs  Lab 02/18/19 1635  PROT 7.3  ALBUMIN 4.1  AST 48*  ALT 43  ALKPHOS 37*  BILITOT 1.0   Hematology Recent Labs  Lab 02/18/19 1635  WBC 5.3  RBC 4.27  HGB 13.4  HCT 39.3  MCV 92.0  MCH 31.4  MCHC 34.1  RDW 12.8  PLT 113*   Cardiac Enzymes Recent Labs  Lab 02/18/19 1635  TROPONINI 0.37*   No results for input(s): TROPIPOC in the last 168 hours.  BNP Recent Labs  Lab 02/18/19 1635  BNP 204.7*     DDimer No results for input(s): DDIMER in the last 168 hours.  Radiology/Studies:  Dg Chest Portable 1 View  Result Date: 02/18/2019 CLINICAL DATA:  Chest pain EXAM: PORTABLE CHEST 1 VIEW COMPARISON:  October 01, 2007 FINDINGS: No edema or consolidation. Heart is upper normal in size with pulmonary vascularity normal. Patient is status post coronary artery bypass grafting. There is aortic atherosclerosis. No adenopathy. No bone lesions. IMPRESSION: No edema or consolidation. Heart upper normal in size. Postoperative changes noted. Aortic Atherosclerosis (ICD10-I70.0). Electronically Signed   By: Lowella Grip III [name]   On: 02/18/2019 15:55    Assessment and Plan:   1. NSTEMI - history of CAD with prior CABG and stenting as reported above - presents with chest pain, initial trop 0.37, EKG without specific ischemic changes.  - medical therapy with hep gtt, ASA, toprol 12.5mg d daily (was taking half tablet at home). Intolerant to statins, continue zetia. Consider ACE-I after cath, though patient hesitant to take additional medications.  - plan for [facility] tomorrow - will need echo  2. Persistent afib - he has historically refused anticoagulation - continue rate control with toprol, currently rates are controlled  3. Hyperlipidemia - statin intolerant, has refused pcsk9 inhibitors - continue zetia  4. Mild thrombocytopenia - mild dating back at least 5 years, stable  5. Known Carotid Stenosis - Followed with serial ultrasound; last 09/2018.   Code Status: DNAR (per living will) but amenable to coronary angiography.   Severity of Illness: The appropriate patient status for this patient is INPATIENT. Inpatient status is judged to be reasonable and necessary in order to provide the required intensity of service to ensure the patient's safety. The patient's presenting symptoms, physical exam findings, and initial radiographic and laboratory data in the context of their chronic comorbidities  is felt to place them at high risk for further clinical deterioration. Furthermore, it is not anticipated that the patient will be medically stable for discharge from the hospital within 2 midnights of admission. The following factors support the patient status of inpatient.   " The patient's presenting symptoms include chest pain, elevated troponin.  " The worrisome physical exam findings include n/a " The initial radiographic and laboratory data are worrisome because of troponin.  " The chronic co-morbidities include HTN, carotid disease.    * I certify that at the point of admission it is my clinical judgment that the patient will require inpatient hospital care spanning beyond 2 midnights from the point of admission due to high intensity of service, high risk for further deterioration and high frequency of surveillance required.*    For questions or updates, please contact [facility] Please consult [email or web address] for contact info under   Lummie Montijo K. Marletta Lor, MD

## 2019-02-18 NOTE — ED Triage Notes (Signed)
Patient is complaining of mid-sternal and left chest pain that radiates to his left arm intermittent over the last week. Associated symptoms shortness of breath, dizziness, and lightheadedness. Described as pressure and soreness.

## 2019-02-19 ENCOUNTER — Inpatient Hospital Stay (HOSPITAL_COMMUNITY): Payer: Medicare Other

## 2019-02-19 ENCOUNTER — Inpatient Hospital Stay (HOSPITAL_COMMUNITY)
Admission: EM | Disposition: A | Discharge status: Home / Self Care | Payer: Self-pay | Source: Home / Self Care | Attending: Cardiovascular Disease

## 2019-02-19 DIAGNOSIS — I25810 Atherosclerosis of coronary artery bypass graft(s) without angina pectoris: Secondary | ICD-10-CM

## 2019-02-19 DIAGNOSIS — I340 Nonrheumatic mitral (valve) insufficiency: Secondary | ICD-10-CM

## 2019-02-19 DIAGNOSIS — I214 Non-ST elevation (NSTEMI) myocardial infarction: Principal | ICD-10-CM

## 2019-02-19 HISTORY — PX: LEFT HEART CATH AND CORS/GRAFTS ANGIOGRAPHY: CATH118250

## 2019-02-19 HISTORY — PX: CORONARY STENT INTERVENTION: CATH118234

## 2019-02-19 LAB — ECHOCARDIOGRAM COMPLETE
Height: 69.5 in
Weight: 2771.2 oz

## 2019-02-19 LAB — TROPONIN I
Troponin I: 0.36 ng/mL (ref <0.03)
Troponin I: 0.31 ng/mL (ref <0.03)

## 2019-02-19 LAB — BASIC METABOLIC PANEL
Anion gap: 16 — ABNORMAL HIGH (ref 5–15)
BUN: 25 mg/dL — ABNORMAL HIGH (ref 8–23)
CO2: 21 mmol/L — ABNORMAL LOW (ref 22–32)
Calcium: 9 mg/dL (ref 8.9–10.3)
Chloride: 101 mmol/L (ref 98–111)
Creatinine, Ser: 1.36 mg/dL — ABNORMAL HIGH (ref 0.61–1.24)
GFR calc Af Amer: 54 mL/min — ABNORMAL LOW (ref >60)
GFR calc non Af Amer: 46 mL/min — ABNORMAL LOW (ref >60)
Glucose, Bld: 151 mg/dL — ABNORMAL HIGH (ref 70–99)
Potassium: 3.5 mmol/L (ref 3.5–5.1)
Sodium: 138 mmol/L (ref 135–145)

## 2019-02-19 LAB — PROTIME-INR
INR: 1.3 — ABNORMAL HIGH (ref 0.8–1.2)
Prothrombin Time: 15.7 seconds — ABNORMAL HIGH (ref 11.4–15.2)

## 2019-02-19 LAB — HEPARIN LEVEL (UNFRACTIONATED)
Heparin Unfractionated: 0.38 IU/mL (ref 0.30–0.70)
Heparin Unfractionated: 0.37 IU/mL (ref 0.30–0.70)

## 2019-02-19 LAB — LIPID PANEL
Cholesterol: 163 mg/dL (ref 0–200)
HDL: 45 mg/dL (ref >40)
LDL Cholesterol: 93 mg/dL (ref 0–99)
Total CHOL/HDL Ratio: 3.6 RATIO
Triglycerides: 127 mg/dL (ref <150)
VLDL: 25 mg/dL (ref 0–40)

## 2019-02-19 LAB — CBC
HCT: 35.4 % — ABNORMAL LOW (ref 39.0–52.0)
Hemoglobin: 12.6 g/dL — ABNORMAL LOW (ref 13.0–17.0)
MCH: 31.6 pg (ref 26.0–34.0)
MCHC: 35.6 g/dL (ref 30.0–36.0)
MCV: 88.7 fL (ref 80.0–100.0)
Platelets: 112 10*3/uL — ABNORMAL LOW (ref 150–400)
RBC: 3.99 MIL/uL — ABNORMAL LOW (ref 4.22–5.81)
RDW: 12.6 % (ref 11.5–15.5)
WBC: 4.9 10*3/uL (ref 4.0–10.5)
nRBC: 0 % (ref 0.0–0.2)

## 2019-02-19 LAB — POCT ACTIVATED CLOTTING TIME: Activated Clotting Time: 351 seconds

## 2019-02-19 SURGERY — LEFT HEART CATH AND CORS/GRAFTS ANGIOGRAPHY
Anesthesia: LOCAL

## 2019-02-19 MED ORDER — IOHEXOL 350 MG/ML SOLN
INTRAVENOUS | Status: DC | PRN
  Administered 2019-02-19: 150 mL via INTRACARDIAC

## 2019-02-19 MED ORDER — SODIUM CHLORIDE 0.9 % WEIGHT BASED INFUSION
1.0000 mL/kg/h | INTRAVENOUS | Status: AC
  Administered 2019-02-19: 1 mL/kg/h via INTRAVENOUS

## 2019-02-19 MED ORDER — SODIUM CHLORIDE 0.9 % WEIGHT BASED INFUSION
3.0000 mL/kg/h | INTRAVENOUS | Status: DC
  Administered 2019-02-19: 3 mL/kg/h via INTRAVENOUS

## 2019-02-19 MED ORDER — MIDAZOLAM HCL 2 MG/2ML IJ SOLN
INTRAMUSCULAR | Status: AC
  Filled 2019-02-19: qty 2

## 2019-02-19 MED ORDER — SODIUM CHLORIDE 0.9% FLUSH
3.0000 mL | Freq: Two times a day (BID) | INTRAVENOUS | Status: DC
  Administered 2019-02-20: 3 mL via INTRAVENOUS

## 2019-02-19 MED ORDER — HEPARIN (PORCINE) IN NACL 1000-0.9 UT/500ML-% IV SOLN
INTRAVENOUS | Status: AC
  Filled 2019-02-19: qty 500

## 2019-02-19 MED ORDER — HEPARIN (PORCINE) IN NACL 1000-0.9 UT/500ML-% IV SOLN
INTRAVENOUS | Status: DC | PRN
  Administered 2019-02-19 (×3): 500 mL

## 2019-02-19 MED ORDER — VERAPAMIL HCL 2.5 MG/ML IV SOLN
INTRAVENOUS | Status: AC
  Filled 2019-02-19: qty 2

## 2019-02-19 MED ORDER — CLOPIDOGREL BISULFATE 300 MG PO TABS
ORAL_TABLET | ORAL | Status: DC | PRN
  Administered 2019-02-19: 600 mg via ORAL

## 2019-02-19 MED ORDER — LIDOCAINE HCL (PF) 1 % IJ SOLN
INTRAMUSCULAR | Status: DC | PRN
  Administered 2019-02-19: 2 mL

## 2019-02-19 MED ORDER — SODIUM CHLORIDE 0.9 % IV SOLN: 250.0000 mL | INTRAVENOUS | Status: DC | PRN

## 2019-02-19 MED ORDER — ADENOSINE (DIAGNOSTIC) FOR INTRACORONARY USE
INTRAVENOUS | Status: DC | PRN
  Administered 2019-02-19: 120 ug via INTRACORONARY

## 2019-02-19 MED ORDER — HEPARIN (PORCINE) IN NACL 1000-0.9 UT/500ML-% IV SOLN
INTRAVENOUS | Status: AC
  Filled 2019-02-19: qty 2000

## 2019-02-19 MED ORDER — ASPIRIN 81 MG PO CHEW
81.0000 mg | CHEWABLE_TABLET | ORAL | Status: AC
  Administered 2019-02-19: 81 mg via ORAL
  Filled 2019-02-19: qty 1

## 2019-02-19 MED ORDER — SODIUM CHLORIDE 0.9% FLUSH: 3.0000 mL | INTRAVENOUS | Status: DC | PRN

## 2019-02-19 MED ORDER — SODIUM CHLORIDE 0.9% FLUSH: 3.0000 mL | Freq: Two times a day (BID) | INTRAVENOUS | Status: DC

## 2019-02-19 MED ORDER — SODIUM CHLORIDE 0.9 % WEIGHT BASED INFUSION: 1.0000 mL/kg/h | INTRAVENOUS | Status: DC

## 2019-02-19 MED ORDER — MIDAZOLAM HCL 2 MG/2ML IJ SOLN
INTRAMUSCULAR | Status: DC | PRN
  Administered 2019-02-19: 1 mg via INTRAVENOUS

## 2019-02-19 MED ORDER — CLOPIDOGREL BISULFATE 300 MG PO TABS
ORAL_TABLET | ORAL | Status: AC
  Filled 2019-02-19: qty 1

## 2019-02-19 MED ORDER — CLOPIDOGREL BISULFATE 75 MG PO TABS
75.0000 mg | ORAL_TABLET | Freq: Every day | ORAL | Status: DC
  Administered 2019-02-20: 75 mg via ORAL
  Filled 2019-02-19: qty 1

## 2019-02-19 MED ORDER — HEPARIN SODIUM (PORCINE) 1000 UNIT/ML IJ SOLN
INTRAMUSCULAR | Status: DC | PRN
  Administered 2019-02-19 (×2): 4000 [IU] via INTRAVENOUS

## 2019-02-19 MED ORDER — LIDOCAINE HCL (PF) 1 % IJ SOLN
INTRAMUSCULAR | Status: AC
  Filled 2019-02-19: qty 30

## 2019-02-19 MED ORDER — HEPARIN SODIUM (PORCINE) 1000 UNIT/ML IJ SOLN
INTRAMUSCULAR | Status: AC
  Filled 2019-02-19: qty 1

## 2019-02-19 MED ORDER — VERAPAMIL HCL 2.5 MG/ML IV SOLN
INTRAVENOUS | Status: DC | PRN
  Administered 2019-02-19: 10 mL via INTRA_ARTERIAL

## 2019-02-19 MED ORDER — ADENOSINE 6 MG/2ML IV SOLN
INTRAVENOUS | Status: AC
  Filled 2019-02-19: qty 2

## 2019-02-19 MED ORDER — NITROGLYCERIN 1 MG/10 ML FOR IR/CATH LAB
INTRA_ARTERIAL | Status: DC | PRN
  Administered 2019-02-19: 200 ug via INTRACORONARY

## 2019-02-19 SURGICAL SUPPLY — 17 items
BALLN SAPPHIRE 2.5X12 (BALLOONS) ×2
BALLOON SAPPHIRE 2.5X12 (BALLOONS) ×1 IMPLANT
CATH INFINITI 5 FR IM (CATHETERS) ×2 IMPLANT
CATH LAUNCHER 6FR AR1 (CATHETERS) ×2 IMPLANT
CATH OPTITORQUE TIG 4.0 5F (CATHETERS) ×2 IMPLANT
DEVICE RAD COMP TR BAND LRG (VASCULAR PRODUCTS) ×2 IMPLANT
GLIDESHEATH SLEND SS 6F .021 (SHEATH) ×2 IMPLANT
GUIDEWIRE INQWIRE 1.5J.035X260 (WIRE) ×1 IMPLANT
INQWIRE 1.5J .035X260CM (WIRE) ×2
KIT ENCORE 26 ADVANTAGE (KITS) ×2 IMPLANT
KIT HEART LEFT (KITS) ×2 IMPLANT
PACK CARDIAC CATHETERIZATION (CUSTOM PROCEDURE TRAY) ×2 IMPLANT
STENT RESOLUTE ONYX 3.0X15 (Permanent Stent) ×2 IMPLANT
TRANSDUCER W/STOPCOCK (MISCELLANEOUS) ×2 IMPLANT
TUBING CIL FLEX 10 FLL-RA (TUBING) ×2 IMPLANT
WIRE HI TORQ VERSACORE-J 145CM (WIRE) ×2 IMPLANT
WIRE RUNTHROUGH .014X180CM (WIRE) ×2 IMPLANT

## 2019-02-19 NOTE — Interval H&P Note (Signed)
Cath Lab Visit (complete for each Cath Lab visit)  Clinical Evaluation Leading to the Procedure:   ACS: Yes.    Non-ACS:  n/a    History and Physical Interval Note:  02/19/2019 11:30 AM  William Weber  has presented today for surgery, with the diagnosis of unstable angina.  The various methods of treatment have been discussed with the patient and family. After consideration of risks, benefits and other options for treatment, the patient has consented to  Procedure(s): LEFT HEART CATH AND CORS/GRAFTS ANGIOGRAPHY (N/A) as a surgical intervention.  The patient's history has been reviewed, patient examined, no change in status, stable for surgery.  I have reviewed the patient's chart and labs.  Questions were answered to the patient's satisfaction.     Kathlyn Sacramento

## 2019-02-19 NOTE — Progress Notes (Signed)
Echocardiogram 2D Echocardiogram has been performed.  William Weber 02/19/2019, 2:26 PM

## 2019-02-19 NOTE — Progress Notes (Addendum)
Progress Note  Patient Name: William Weber Date of Encounter: 02/19/2019  Primary Cardiologist: Lauree Chandler, MD   Subjective   No chest pain this morning.   Inpatient Medications    Scheduled Meds: . aspirin  81 mg Oral Pre-Cath  . aspirin EC  81 mg Oral Daily  . ezetimibe  10 mg Oral Daily  . metoprolol succinate  12.5 mg Oral QPM  . sodium chloride flush  3 mL Intravenous Q12H  . vitamin B-12  100 mcg Oral Daily   Continuous Infusions: . sodium chloride    . sodium chloride    . heparin 1,000 Units/hr (02/18/19 1857)   PRN Meds: sodium chloride, ondansetron (ZOFRAN) IV, sodium chloride flush   Vital Signs    Vitals:   02/19/19 0028 02/19/19 0409 02/19/19 0412 02/19/19 0808  BP: 125/82 110/69  117/64  Pulse: 66 97  61  Resp:  16  20  Temp:  98.3 F (36.8 C)  98 F (36.7 C)  TempSrc:    Oral  SpO2: 100% 96%  99%  Weight:   78.6 kg   Height:        Intake/Output Summary (Last 24 hours) at 02/19/2019 0857 Last data filed at 02/19/2019 0700 Gross per 24 hour  Intake 240 ml  Output 575 ml  Net -335 ml   Last 3 Weights 02/19/2019 02/18/2019 02/18/2019  Weight (lbs) 173 lb 3.2 oz 178 lb 8 oz 188 lb  Weight (kg) 78.563 kg 80.967 kg 85.276 kg      Telemetry    Afib - Personally Reviewed  ECG    Afib - Personally Reviewed  Physical Exam   GEN: No acute distress.   Neck: No JVD Cardiac: Irreg Irreg, no murmurs, rubs, or gallops.  Respiratory: Clear to auscultation bilaterally. GI: Soft, nontender, non-distended  MS: No edema; No deformity. Neuro:  Nonfocal  Psych: Normal affect   Labs    Chemistry Recent Labs  Lab 02/18/19 1635 02/19/19 0339  NA 136 138  K 3.6 3.5  CL 102 101  CO2 24 21*  GLUCOSE 138* 151*  BUN 29* 25*  CREATININE 1.26* 1.36*  CALCIUM 9.1 9.0  PROT 7.3  --   ALBUMIN 4.1  --   AST 48*  --   ALT 43  --   ALKPHOS 37*  --   BILITOT 1.0  --   GFRNONAA 51* 46*  GFRAA 59* 54*  ANIONGAP 10 16*     Hematology  Recent Labs  Lab 02/18/19 1635 02/19/19 0339  WBC 5.3 4.9  RBC 4.27 3.99*  HGB 13.4 12.6*  HCT 39.3 35.4*  MCV 92.0 88.7  MCH 31.4 31.6  MCHC 34.1 35.6  RDW 12.8 12.6  PLT 113* 112*    Cardiac Enzymes Recent Labs  Lab 02/18/19 1635 02/18/19 2119 02/19/19 0339  TROPONINI 0.37* 0.37* 0.36*   No results for input(s): TROPIPOC in the last 168 hours.   BNP Recent Labs  Lab 02/18/19 1635  BNP 204.7*     DDimer No results for input(s): DDIMER in the last 168 hours.   Radiology    Dg Chest Portable 1 View  Result Date: 02/18/2019 CLINICAL DATA:  Chest pain EXAM: PORTABLE CHEST 1 VIEW COMPARISON:  October 01, 2007 FINDINGS: No edema or consolidation. Heart is upper normal in size with pulmonary vascularity normal. Patient is status post coronary artery bypass grafting. There is aortic atherosclerosis. No adenopathy. No bone lesions. IMPRESSION: No edema or consolidation. Heart upper  normal in size. Postoperative changes noted. Aortic Atherosclerosis (ICD10-I70.0). Electronically Signed   By: Lowella Grip III [name]   On: 02/18/2019 15:55    Cardiac Studies   N/a  Patient Profile     85 y.o. male with PMH of CAD s/p CABG '02, HTN, HL, COPD who presented with chest pain.  Assessment & Plan    1. NSTEMI: Troponin peaked at 0.37, EKG with Afib rate controlled. No chest burning overnight. Symptoms concerninf for ACS and very similar to what he experienced with his CABG. Remains on IV heparin -- planned for cardiac cath today -- The patient understands that risks included but are not limited to stroke (1 in 1000), death (1 in 1000), kidney failure [usually temporary] (1 in 500), bleeding (1 in 200), allergic reaction [possibly serious] (1 in 200).   2. HTN: stable with current therapy  3. HL: has refused statin in the past, and not interested in PCSK9s. Tolerating Zetia 10mg  daily. LDL 93  4. Permanent Afib: rate controlled. Refuses [place].   5. DM: refuses treatment   6. CAD s/p CABG x5 '02: had DES to LCx in 2009 with 4/5 patent grafts.  -- remains on ASA  For questions or updates, please contact Mobile Please consult [email or web address] for contact info under    Signed, Reino Bellis, NP  02/19/2019, 8:57 AM    I have personally seen and examined this patient. I agree with the assessment and plan as outlined above.  Pt admitted with NSTEMI. Known to have CAD s/p CABG in 2002 with last cath in 2009. 4/5 patent bypasses at that time. DES to Circumflex in 2009. Chest pain at home c/w unstable angina. Troponin elevated here. No pain at rest this am.  Plans for cardiac cath later today with probable PCI.   Lauree Chandler 02/19/2019 10:01 AM

## 2019-02-19 NOTE — Progress Notes (Signed)
ANTICOAGULATION CONSULT NOTE - Follow Up Consult  Pharmacy Consult for heparin Indication: NSTEMI  Labs: Recent Labs    02/18/19 1515 02/18/19 1635 02/18/19 2119 02/19/19 0339 02/19/19 0342  HGB  --  13.4  --   --   --   HCT  --  39.3  --   --   --   PLT  --  113*  --   --   --   APTT 35  --   --   --   --   LABPROT  --  14.7  --  15.7*  --   INR  --  1.2  --  1.3*  --   HEPARINUNFRC  --   --   --   --  0.38  CREATININE  --  1.26*  --   --   --   TROPONINI  --  0.37* 0.37*  --   --     Assessment/Plan:  85yo male therapeutic on heparin with initial dosing for NSTEMI. Will continue gtt at current rate and confirm stable with additional level.   Wynona Neat, PharmD, BCPS  02/19/2019,5:34 AM

## 2019-02-19 NOTE — Progress Notes (Signed)
ANTICOAGULATION CONSULT NOTE - Follow Up Consult  Pharmacy Consult for heparin Indication: NSTEMI  Labs: Recent Labs    02/18/19 1515  02/18/19 1635 02/18/19 2119 02/19/19 0339 02/19/19 0342 02/19/19 0856  HGB  --   --  13.4  --  12.6*  --   --   HCT  --   --  39.3  --  35.4*  --   --   PLT  --   --  113*  --  112*  --   --   APTT 35  --   --   --   --   --   --   LABPROT  --   --  14.7  --  15.7*  --   --   INR  --   --  1.2  --  1.3*  --   --   HEPARINUNFRC  --   --   --   --   --  0.38 0.37  CREATININE  --   --  1.26*  --  1.36*  --   --   TROPONINI  --    < > 0.37* 0.37* 0.36*  --  0.31*   < > = values in this interval not displayed.    Assessment/Plan:  85yo male on heparin with initial dosing for NSTEMI. Heparin level remains therapeutic x2 on 1000 units/hr.  Will continue gtt at current rate and follow up post cardiac cath today.    Nicole Cella, RPh Clinical Pharmacist [phone] Please check AMION for all [place] phone numbers After 10:00 PM, call [place] [phone] 02/19/2019,10:33 AM

## 2019-02-19 NOTE — H&P (View-Only) (Signed)
Progress Note  Patient Name: William Weber Date of Encounter: 02/19/2019  Primary Cardiologist: Lauree Chandler, MD   Subjective   No chest pain this morning.   Inpatient Medications    Scheduled Meds: . aspirin  81 mg Oral Pre-Cath  . aspirin EC  81 mg Oral Daily  . ezetimibe  10 mg Oral Daily  . metoprolol succinate  12.5 mg Oral QPM  . sodium chloride flush  3 mL Intravenous Q12H  . vitamin B-12  100 mcg Oral Daily   Continuous Infusions: . sodium chloride    . sodium chloride    . heparin 1,000 Units/hr (02/18/19 1857)   PRN Meds: sodium chloride, ondansetron (ZOFRAN) IV, sodium chloride flush   Vital Signs    Vitals:   02/19/19 0028 02/19/19 0409 02/19/19 0412 02/19/19 0808  BP: 125/82 110/69  117/64  Pulse: 66 97  61  Resp:  16  20  Temp:  98.3 F (36.8 C)  98 F (36.7 C)  TempSrc:    Oral  SpO2: 100% 96%  99%  Weight:   78.6 kg   Height:        Intake/Output Summary (Last 24 hours) at 02/19/2019 0857 Last data filed at 02/19/2019 0700 Gross per 24 hour  Intake 240 ml  Output 575 ml  Net -335 ml   Last 3 Weights 02/19/2019 02/18/2019 02/18/2019  Weight (lbs) 173 lb 3.2 oz 178 lb 8 oz 188 lb  Weight (kg) 78.563 kg 80.967 kg 85.276 kg      Telemetry    Afib - Personally Reviewed  ECG    Afib - Personally Reviewed  Physical Exam   GEN: No acute distress.   Neck: No JVD Cardiac: Irreg Irreg, no murmurs, rubs, or gallops.  Respiratory: Clear to auscultation bilaterally. GI: Soft, nontender, non-distended  MS: No edema; No deformity. Neuro:  Nonfocal  Psych: Normal affect   Labs    Chemistry Recent Labs  Lab 02/18/19 1635 02/19/19 0339  NA 136 138  K 3.6 3.5  CL 102 101  CO2 24 21*  GLUCOSE 138* 151*  BUN 29* 25*  CREATININE 1.26* 1.36*  CALCIUM 9.1 9.0  PROT 7.3  --   ALBUMIN 4.1  --   AST 48*  --   ALT 43  --   ALKPHOS 37*  --   BILITOT 1.0  --   GFRNONAA 51* 46*  GFRAA 59* 54*  ANIONGAP 10 16*     Hematology  Recent Labs  Lab 02/18/19 1635 02/19/19 0339  WBC 5.3 4.9  RBC 4.27 3.99*  HGB 13.4 12.6*  HCT 39.3 35.4*  MCV 92.0 88.7  MCH 31.4 31.6  MCHC 34.1 35.6  RDW 12.8 12.6  PLT 113* 112*    Cardiac Enzymes Recent Labs  Lab 02/18/19 1635 02/18/19 2119 02/19/19 0339  TROPONINI 0.37* 0.37* 0.36*   No results for input(s): TROPIPOC in the last 168 hours.   BNP Recent Labs  Lab 02/18/19 1635  BNP 204.7*     DDimer No results for input(s): DDIMER in the last 168 hours.   Radiology    Dg Chest Portable 1 View  Result Date: 02/18/2019 CLINICAL DATA:  Chest pain EXAM: PORTABLE CHEST 1 VIEW COMPARISON:  October 01, 2007 FINDINGS: No edema or consolidation. Heart is upper normal in size with pulmonary vascularity normal. Patient is status post coronary artery bypass grafting. There is aortic atherosclerosis. No adenopathy. No bone lesions. IMPRESSION: No edema or consolidation. Heart upper  normal in size. Postoperative changes noted. Aortic Atherosclerosis (ICD10-I70.0). Electronically Signed   By: Lowella Grip III [name]   On: 02/18/2019 15:55    Cardiac Studies   N/a  Patient Profile     85 y.o. male with PMH of CAD s/p CABG '02, HTN, HL, COPD who presented with chest pain.  Assessment & Plan    1. NSTEMI: Troponin peaked at 0.37, EKG with Afib rate controlled. No chest burning overnight. Symptoms concerninf for ACS and very similar to what he experienced with his CABG. Remains on IV heparin -- planned for cardiac cath today -- The patient understands that risks included but are not limited to stroke (1 in 1000), death (1 in 1000), kidney failure [usually temporary] (1 in 500), bleeding (1 in 200), allergic reaction [possibly serious] (1 in 200).   2. HTN: stable with current therapy  3. HL: has refused statin in the past, and not interested in PCSK9s. Tolerating Zetia 10mg  daily. LDL 93  4. Permanent Afib: rate controlled. Refuses [place].   5. DM: refuses treatment   6. CAD s/p CABG x5 '02: had DES to LCx in 2009 with 4/5 patent grafts.  -- remains on ASA  For questions or updates, please contact [place] Please consult [email or web address] for contact info under    Signed, Reino Bellis, NP  02/19/2019, 8:57 AM    I have personally seen and examined this patient. I agree with the assessment and plan as outlined above.  Pt admitted with NSTEMI. Known to have CAD s/p CABG in 2002 with last cath in 2009. 4/5 patent bypasses at that time. DES to Circumflex in 2009. Chest pain at home c/w unstable angina. Troponin elevated here. No pain at rest this am.  Plans for cardiac cath later today with probable PCI.   Lauree Chandler 02/19/2019 10:01 AM

## 2019-02-20 ENCOUNTER — Encounter (HOSPITAL_COMMUNITY): Payer: Self-pay | Admitting: Cardiovascular Disease

## 2019-02-20 DIAGNOSIS — E118 Type 2 diabetes mellitus with unspecified complications: Secondary | ICD-10-CM

## 2019-02-20 LAB — BASIC METABOLIC PANEL
Anion gap: 10 (ref 5–15)
BUN: 24 mg/dL — ABNORMAL HIGH (ref 8–23)
CO2: 21 mmol/L — ABNORMAL LOW (ref 22–32)
Calcium: 8.5 mg/dL — ABNORMAL LOW (ref 8.9–10.3)
Chloride: 106 mmol/L (ref 98–111)
Creatinine, Ser: 1.42 mg/dL — ABNORMAL HIGH (ref 0.61–1.24)
GFR calc Af Amer: 51 mL/min — ABNORMAL LOW (ref >60)
GFR calc non Af Amer: 44 mL/min — ABNORMAL LOW (ref >60)
Glucose, Bld: 158 mg/dL — ABNORMAL HIGH (ref 70–99)
Potassium: 3.8 mmol/L (ref 3.5–5.1)
Sodium: 137 mmol/L (ref 135–145)

## 2019-02-20 LAB — CBC
HCT: 35.3 % — ABNORMAL LOW (ref 39.0–52.0)
Hemoglobin: 12.2 g/dL — ABNORMAL LOW (ref 13.0–17.0)
MCH: 30.8 pg (ref 26.0–34.0)
MCHC: 34.6 g/dL (ref 30.0–36.0)
MCV: 89.1 fL (ref 80.0–100.0)
Platelets: 103 10*3/uL — ABNORMAL LOW (ref 150–400)
RBC: 3.96 MIL/uL — ABNORMAL LOW (ref 4.22–5.81)
RDW: 12.5 % (ref 11.5–15.5)
WBC: 5.1 10*3/uL (ref 4.0–10.5)
nRBC: 0 % (ref 0.0–0.2)

## 2019-02-20 MED ORDER — CLOPIDOGREL BISULFATE 75 MG PO TABS: 75.0000 mg | ORAL_TABLET | Freq: Every day | ORAL | 2 refills | Status: DC

## 2019-02-20 MED ORDER — METOPROLOL SUCCINATE ER 25 MG PO TB24: 12.5000 mg | ORAL_TABLET | Freq: Every evening | ORAL | 1 refills | Status: DC

## 2019-02-20 MED ORDER — PANTOPRAZOLE SODIUM 40 MG PO TBEC: 40.0000 mg | DELAYED_RELEASE_TABLET | Freq: Every day | ORAL | 1 refills | Status: DC

## 2019-02-20 MED FILL — METOPROLOL SUCCINATE ER 25: 25 | 30 days supply | Qty: 30 | Fill #0

## 2019-02-20 MED FILL — CLOPIDOGREL 75 MG TABLET: 75 | 90 days supply | Qty: 90 | Fill #0

## 2019-02-20 MED FILL — PANTOPRAZOLE SOD DR 40 MG T: 40 | 30 days supply | Qty: 30 | Fill #0

## 2019-02-20 NOTE — Progress Notes (Addendum)
CARDIAC REHAB PHASE I   PRE:  Rate/Rhythm: 69 Afib  BP:  Sitting: 151/74      SaO2: 97 RA  MODE:  Ambulation: 450 ft   POST:  Rate/Rhythm: 89 Afib  BP:  Sitting: 142/91    SaO2: 93 RA   Pt ambulated 469ft in hallway standby assist with one standing rest break. Pt denies CP, does c/o some SOB. Pt educated on stent, and importance of ASA, Plavix, and NTG. Pt given [place] book, exercise guidelines, and heart healthy diet. Encouraged pt to ambulate as able but emphasized safety. Pt does not have a smart phone or tablet to be able to participate in Virtual Cardiac Rehab, but will place order for CRP II to [place].   [id number] Rufina Falco, RN BSN 02/20/2019 9:16 AM

## 2019-02-20 NOTE — Discharge Summary (Signed)
Discharge Summary    Patient ID: ELOHIM BRUNE,  MRN: [id number], DOB/AGE: Dec 30, 1929 85 y.o.  Admit date: 02/18/2019 Discharge date: 02/20/2019  Primary Care Provider: Christain [place] Primary Cardiologist: Dr. Angelena Form   Discharge Diagnoses    Active Problems:   NSTEMI (non-ST elevated myocardial infarction) [facility])   Dyslipidemia   Atrial fibrillation ([place])   Unstable angina ([place])   Type 2 diabetes mellitus with complication, without long-term current use of insulin (H[facility])   Allergies Allergies  Allergen Reactions  . Allopurinol Other (See Comments)    Painful skin nodules  . Metformin And Related Diarrhea    GAS  . Percocet [Oxycodone-Acetaminophen]   . Rabeprazole Sodium   . Statins     Hmg-Coa Reductase inhibitors, Myalgias (intolerance)    Diagnostic Studies/Procedures    Cath: 02/19/19   Ost Cx to Prox Cx lesion is 95% stenosed.  Mid LM lesion is 95% stenosed.  Ost 1st Mrg lesion is 80% stenosed.  Prox LAD lesion is 100% stenosed.  Mid RCA lesion is 70% stenosed.  Dist RCA lesion is 50% stenosed.  Post Atrio lesion is 70% stenosed.  SVG.  1st Diag lesion is 95% stenosed.  Origin to Prox Graft lesion is 40% stenosed.  Origin to Prox Graft lesion is 90% stenosed.  Mid Graft to Dist Graft lesion is 85% stenosed.  Dist Graft lesion is 95% stenosed.  Post intervention, there is a 0% residual stenosis.  A drug-eluting stent was successfully placed using a STENT RESOLUTE ONYX 3.0X15.  The left ventricular systolic function is normal.  LV end diastolic pressure is normal.  The left ventricular ejection fraction is 55-65% by visual estimate.  LIMA and is normal in caliber.   1.  Significant underlying three-vessel coronary artery disease with patent LIMA to LAD.  Severe disease affecting SVG to OM, SVG to diagonal and SVG to right PDA.  In addition, there is severe in-stent restenosis in the native left circumflex with new severe stenosis  in the mid left main coronary artery. 2.  Normal LV systolic function and left ventricular end-diastolic pressure. 3.  Successful angioplasty and drug-eluting stent placement to the SVG to right PDA  Recommendations: This was a difficult situation as it was difficult to know the culprit for non-ST elevation myocardial infarction.  I discussed the case with Dr. Angelena Form and I decided to intervene on the SVG graft with the largest territory and least procedure risk.  SVG to OM is diffusely diseased and even with successful PCI, I do not think it will stay open long-term.  PCI of SVG to diagonal is possible although the disease is at the anastomosis with extension into the proximal portion of the diagonal branch.  There is significant vessel mismatch and the native diagonal is likely 2.25 mm in diameter.  PCI of the left main and left circumflex is also challenging given the 90 degree takeoff of the left circumflex from the left main with calcifications and previous stent in the proximal left circumflex.  TTE: 02/19/19  IMPRESSIONS    1. The left ventricle has normal systolic function with an ejection fraction of 60-65%. The cavity size was normal. Mild basal septal hypertrophy. Left ventricular diastolic function could not be evaluated secondary to atrial fibrillation. No evidence  of left ventricular regional wall motion abnormalities.  2. The right ventricle has normal systolic function. The cavity was normal. There is no increase in right ventricular wall thickness.  3. Left atrial size was  mildly dilated.  4. There is moderate mitral annular calcification present.  5. The inferior vena cava was dilated in size with >50% respiratory variability. _____________   History of Present Illness     85 yo male history of CAD with prior CABG (5 vessel in 2002 LIMA-LAD, SVG-D1, SVG-PDA and PLA sequential, SVG-OM1), history of DES to LCX in 2009, persistent Afib but has historically refused anticoag,  HTN, HL, COPD, statin intolerance on zetia having refused pcsk9 inhibitors, who presented with chest pain.   Stated that 1.5 weeks ago he noted gradual onset of intermittent exertion chest pain. Seen by PMD, who recommended ER visit, but given that he was asymptomatic without exertion he declined. Over the last 3 days prior to admission he had been spreading mulch in the yard and chest pain returned. Encouraged by son to seek medical attention. Described the pain and a central "burning" sensation identical to - but not as severe as - the pain he experienced prior to his CABG. Not associated with lightheadedness or dizziness. Relieved with rest.   In the ER, initial troponin 0.37 with EKG in well controlled AF without ischemic changes. COVID negative. Transferred from [facility] to [facility] for ischemic evaluation.   Hospital Course     Troponin peaked at 0.37. He underwent cardiac cath noted above with significant 3v CAD. Patent LIMA-LAD with severe disease affecting the SVG to OM, SVG to diag and SVG to rPDA. There was also severe ISR in the native Lcx with new stenosis in the LM. His case was discussed between Dr. Angelena Form and Dr. Fletcher Anon with decision being made to PCI the SVG-rPDA with DESx1. It was felt the SVG-OM was diffusely diseased and would likely not stay open even with PCI long term. Placed on DAPT with ASA/plavix for at least one year. No recurrent chest burning post cath. Ambulated without difficulty. Of note he does not wish to be placed on statin, nor [place] for his Afib. Worked well with cardiac rehab.   General: Well developed, well nourished, male appearing in no acute distress. Head: Normocephalic, atraumatic.  Neck: Supple without bruits, JVD. Lungs:  Resp regular and unlabored, CTA. Heart: Irreg Irreg, S1, S2, no S3, S4, + murmur; no rub. Abdomen: Soft, non-tender, non-distended with normoactive bowel sounds. No hepatomegaly. No rebound/guarding. No obvious abdominal masses. Extremities: No  clubbing, cyanosis, edema. Distal pedal pulses are 2+ bilaterally. L radial cath site stable without bruising or hematoma Neuro: Alert and oriented X 3. Moves all extremities spontaneously. Psych: Normal affect.  [name] was seen by Dr. Angelena Form and determined stable for discharge home. Follow up in the office has been arranged. Medications are listed below.   _____________  Discharge Vitals Blood pressure 135/90, pulse (!) 42, temperature 98.1 F (36.7 C), resp. rate 20, height 5' 9.5" (1.765 m), weight 80.3 kg, SpO2 100 %.  Filed Weights   02/18/19 2100 02/19/19 0412 02/20/19 0545  Weight: 81 kg 78.6 kg 80.3 kg    Labs & Radiologic Studies    CBC Recent Labs    02/18/19 1635 02/19/19 0339 02/20/19 0341  WBC 5.3 4.9 5.1  NEUTROABS 3.5  --   --   HGB 13.4 12.6* 12.2*  HCT 39.3 35.4* 35.3*  MCV 92.0 88.7 89.1  PLT 113* 112* 431*   Basic Metabolic Panel Recent Labs    02/18/19 1635 02/19/19 0339 02/20/19 0341  NA 136 138 137  K 3.6 3.5 3.8  CL 102 101 106  CO2 24 21*  21*  GLUCOSE 138* 151* 158*  BUN 29* 25* 24*  CREATININE 1.26* 1.36* 1.42*  CALCIUM 9.1 9.0 8.5*  MG 1.8  --   --    Liver Function Tests Recent Labs    02/18/19 1635  AST 48*  ALT 43  ALKPHOS 37*  BILITOT 1.0  PROT 7.3  ALBUMIN 4.1   No results for input(s): LIPASE, AMYLASE in the last 72 hours. Cardiac Enzymes Recent Labs    02/18/19 2119 02/19/19 0339 02/19/19 0856  TROPONINI 0.37* 0.36* 0.31*   BNP Invalid input(s): POCBNP D-Dimer No results for input(s): DDIMER in the last 72 hours. Hemoglobin A1C Recent Labs    02/18/19 2119  HGBA1C 7.6*   Fasting Lipid Panel Recent Labs    02/19/19 0339  CHOL 163  HDL 45  LDLCALC 93  TRIG 127  CHOLHDL 3.6   Thyroid Function Tests No results for input(s): TSH, T4TOTAL, T3FREE, THYROIDAB in the last 72 hours.  Invalid input(s): FREET3 _____________  Dg Chest Portable 1 View  Result Date: 02/18/2019 CLINICAL DATA:   Chest pain EXAM: PORTABLE CHEST 1 VIEW COMPARISON:  October 01, 2007 FINDINGS: No edema or consolidation. Heart is upper normal in size with pulmonary vascularity normal. Patient is status post coronary artery bypass grafting. There is aortic atherosclerosis. No adenopathy. No bone lesions. IMPRESSION: No edema or consolidation. Heart upper normal in size. Postoperative changes noted. Aortic Atherosclerosis (ICD10-I70.0). Electronically Signed   By: Lowella Grip III [name]   On: 02/18/2019 15:55   Disposition   Pt is being discharged home today in good condition.  Follow-up Plans & Appointments     Discharge Instructions    AMB Referral to Cardiac Rehabilitation - Phase II   Complete by:  As directed    Diagnosis:   NSTEMI Coronary Stents     After initial evaluation and assessments completed: Virtual Based Care may be provided alone or in conjunction with Phase 2 Cardiac Rehab based on patient barriers.:  Yes       Discharge Medications     Medication List    STOP taking these medications   lansoprazole 30 MG capsule Commonly known as:  PREVACID   triamterene-hydrochlorothiazide 37.5-25 MG capsule Commonly known as:  DYAZIDE     TAKE these medications   aspirin 81 MG tablet Take 81 mg by mouth daily.   BENADRYL ALLERGY PO Take 1 tablet by mouth at bedtime.   clopidogrel 75 MG tablet Commonly known as:  PLAVIX Take 1 tablet (75 mg total) by mouth daily with breakfast.   ezetimibe 10 MG tablet Commonly known as:  ZETIA Take 1 tablet (10 mg total) by mouth daily.   fish oil-omega-3 fatty acids 1000 MG capsule Take 1-2 g by mouth 2 (two) times a day. Take 2 capsules in the morning and Take 1 capsule in the evening   metoprolol succinate 25 MG 24 hr tablet Commonly known as:  TOPROL-XL Take 0.5 tablets (12.5 mg total) by mouth every evening. What changed:    medication strength  how much to take   pantoprazole 40 MG tablet Commonly known as:  Protonix  Take 1 tablet (40 mg total) by mouth daily.   vitamin B-12 100 MCG tablet Commonly known as:  CYANOCOBALAMIN Take 100 mcg by mouth daily.   vitamin C with rose hips 1000 MG tablet Take 1,000 mg by mouth daily.        Acute coronary syndrome (MI, NSTEMI, STEMI, etc) this admission?: Yes.  AHA/ACC Clinical Performance & Quality Measures: 1. Aspirin prescribed? - Yes 2. ADP Receptor Inhibitor (Plavix/Clopidogrel, Brilinta/Ticagrelor or Effient/Prasugrel) prescribed (includes medically managed patients)? - Yes 3. Beta Blocker prescribed? - Yes 4. High Intensity Statin (Lipitor 40-80mg  or Crestor 20-40mg ) prescribed? - No - pt refused 5. EF assessed during THIS hospitalization? - Yes 6. For EF <40%, was ACEI/ARB prescribed? - Not Applicable (EF >/= 08%) 7. For EF <40%, Aldosterone Antagonist (Spironolactone or Eplerenone) prescribed? - Not Applicable (EF >/= 67%) 8. Cardiac Rehab Phase II ordered (Included Medically managed Patients)? - Yes      Outstanding Labs/Studies   N/a   Duration of Discharge Encounter   Greater than 30 minutes including physician time.  Signed, Reino Bellis NP-C 02/20/2019, 8:25 AM   I have personally seen and examined this patient. I agree with the assessment and plan as outlined above.  Pt doing well this am. No chest pain. Stent to SVG to PDA yesterday. Will d/c home on ASA and Plavix today. Follow up 1-2 weeks by virtual clinic.   Lauree Chandler 02/20/2019 10:00 AM

## 2019-02-20 NOTE — Plan of Care (Signed)
  Problem: Education: Goal: Knowledge of General Education information will improve Description Including pain rating scale, medication(s)/side effects and non-pharmacologic comfort measures Outcome: Progressing   Problem: Health Behavior/Discharge Planning: Goal: Ability to manage health-related needs will improve Outcome: Progressing   

## 2019-02-21 MED FILL — Heparin Sod (Porcine)-NaCl IV Soln 1000 Unit/500ML-0.9%: INTRAVENOUS | Qty: 500 | Status: AC

## 2019-02-24 ENCOUNTER — Telehealth: Payer: Self-pay | Admitting: *Deleted

## 2019-02-24 NOTE — Telephone Encounter (Signed)
    COVID-19 Pre-Screening Questions:  . In the past 7 to 10 days have you had a cough,  shortness of breath, headache, congestion, fever (100 or greater) body aches, chills, sore throat, or sudden loss of taste or sense of smell? . Have you been around anyone with known Covid 19. . Have you been around anyone who is awaiting Covid 19 test results in the past 7 to 10 days? . Have you been around anyone who has been exposed to Covid 19, or has mentioned symptoms of Covid 19 within the past 7 to 10 days?  If you have any concerns/questions about symptoms patients report during screening (either on the phone or at threshold). Contact the provider seeing the patient or DOD for further guidance.  If neither are available contact a member of the leadership team.           Contacted patient vis phone call. Message system is full. I will try again later. KB

## 2019-02-26 ENCOUNTER — Other Ambulatory Visit: Payer: Medicare Other

## 2019-03-06 ENCOUNTER — Telehealth: Payer: Self-pay | Admitting: Physician Assistant

## 2019-03-06 NOTE — Telephone Encounter (Signed)
Called and spoke to patient. He states that he had a stent placed a couple of weeks ago. He states that the past couple of days he has started to have the same burning sensation at the top of his chest near his collar bone that he had before. He states that it is not as severe as it was before and rated the discomfort 4/10. He states that it happens when he is is up active with his arms. Denies discomfort at rest. Patient states that his Sx are relieved with rest. No NTG. Patient states that it is similar to when he had stents before but not as intense. He denies any discomfort at this time. Patient states that his left arm feels weak at times. Patient does not have any vitals to offer. He denies DOE, chest pain, dizziness, lightheadedness, diaphoresis, or any other Sx. Patient with severe disease but only one stent was placed. Appointment made for patient to see Richardson Dopp, PA tomorrow. ER precautions reviewed with the patient.       COVID-19 Pre-Screening Questions:  . In the past 7 to 10 days have you had a cough,  shortness of breath, headache, congestion, fever (100 or greater) body aches, chills, sore throat, or sudden loss of taste or sense of smell? NO . Have you been around anyone with known Covid 19? NO . Have you been around anyone who is awaiting Covid 19 test results in the past 7 to 10 days? NO . Have you been around anyone who has been exposed to Covid 19, or has mentioned symptoms of Covid 19 within the past 7 to 10 days? NO

## 2019-03-06 NOTE — Telephone Encounter (Signed)
New Message     Pt is calling and says he is still have pain and burning after the stent was put in, 2 weeks ago. He has SOB and his left arm stays tired all the time     Please call

## 2019-03-06 NOTE — Progress Notes (Signed)
Cardiology Office Note:    Date:  03/07/2019   ID:  William Weber, DOB 06-13-1930, MRN [id number]  PCP:  Christain Sacramento, MD  Cardiologist:  Lauree Chandler, MD   Electrophysiologist:  None   Referring MD: Christain Sacramento, MD   Chief Complaint  Patient presents with   Chest Pain    History of Present Illness:    William Weber is a 85 y.o. male with:  Coronary artery disease  Status post CABG in 2002  PCI 2009: DES to the LCx  NSTEMI 02/2019: PCI - DES to S-RPDA  Paroxysmal atrial fibrillation  Patient has declined anticoagulation  Carotid artery disease  Diabetes mellitus  Hypertension  Hyperlipidemia  Statin intolerance  COPD  William Weber was last seen in clinic in January 2020 by Ermalinda Barrios, PA-C.  He was admitted 6/2-6/4 with a NSTEMI.  Cardiac catheterization demonstrated significant underlying three-vessel CAD with patent LIMA-LAD.  There was severe disease affecting the SVG-OM, SVG-DX, SVG-RPDA and severe in-stent restenosis in the native LCx and new severe stenosis in the mid left main.  Dr. Fletcher Anon performed this procedure.  It was difficult to discern the exact culprit for the NSTEMI.  It was not felt that successful PCI of the SVG-OM would result in long-term patency.  The disease in the SVG-DX was at the anastomosis with extension into the proximal portion of the diagonal branch.  It was noted there was significant vessel mismatch with the native diagonal.  PCI of the left main and LCx would be challenging given the 90 degree takeoff of the LCx from the left main with calcifications and previous stents in the proximal LCx.  After consultation with his colleagues, it was decided to proceed with PCI of the vein graft with the largest territory and least procedure risk.  Therefore, he underwent DES to the SVG-RPDA.  Post PCI course was uneventful.  He called in recently with recurrent anginal symptoms.  Therefore, he was added on the schedule  for further evaluation.  He is here alone today.  For the first 2 days after discharge, he had no real chest symptoms.  However, since then he has developed chest discomfort with mild to moderate exertion.  He has developed chest burning with washing his car as well as with attaching a hose to the down spout to put water in his pond.  He has associated shortness of breath and left arm discomfort.  He has not had syncope, paroxysmal nocturnal dyspnea, leg swelling.  He has not had any bleeding issues.  Prior CV studies:   The following studies were reviewed today:   Echocardiogram 02/19/2019 EF 60-65, mild basal septal hypertrophy, mild LAE, moderate MAC  Cardiac catheterization 02/19/2019 LM mid 95 LAD proximal 100; D1 95 LCx ostial stent 95 ISR; OM1 ostial 80 RCA mid 70, distal 50; RPAV 70 SVG-D1 40 SVG-OM2 proximal 90, mid 85 SVG-RPDA distal 95 LIMA-LAD patent EF 55-65 PCI: 3 x 15 mm resolute Onyx DES to the SVG-RPDA     Carotid US 09/30/2018 R 1-39; L 60-79 Repeat 12 months  Past Medical History:  Diagnosis Date   Anxiety    mild   Atrial fibrillation (HCC)    CAD (coronary artery disease)    DES proximal circumflex January, 2009  /   nuclear, December, 2011, no ischemia, ejection fraction 70%   Carotid artery disease ([place])    Doppler, August, 2011, zero 43% RIC A., 32-95% LICA, stable   Chronic headaches  COPD (chronic obstructive pulmonary disease) (HCC)    Diabetic peripheral neuropathy ([place]) 01/22/2018   Dyslipidemia    statin intolerance   Ejection fraction    EF 70%, nuclear, December, 2011   GERD (gastroesophageal reflux disease)    Hiatal hernia    Hx of CABG    Hendrickson, 2002   Hypercholesterolemia    Hypertension    MGUS (monoclonal gammopathy of unknown significance) 01/22/2018   Prostate cancer ([place])    TUR and implant   Rash    2009, etiology not clear, not from Plavix, Claritin helped   Statin intolerance    Surgical Hx: The  patient  has a past surgical history that includes Bladder surgery; Cardiac surgery; Prostate surgery; LEFT HEART CATH AND CORS/GRAFTS ANGIOGRAPHY (N/A, 02/19/2019); and CORONARY STENT INTERVENTION (N/A, 02/19/2019).   Current Medications: Current Meds  Medication Sig   Ascorbic Acid (VITAMIN C WITH ROSE HIPS) 1000 MG tablet Take 1,000 mg by mouth daily.   clopidogrel (PLAVIX) 75 MG tablet Take 1 tablet (75 mg total) by mouth daily with breakfast.   diphenhydrAMINE (BENADRYL ALLERGY) 25 MG tablet Take 25 mg by mouth at bedtime.    ezetimibe (ZETIA) 10 MG tablet Take 1 tablet (10 mg total) by mouth daily. (Patient taking differently: Take 10 mg by mouth at bedtime. )   fish oil-omega-3 fatty acids 1000 MG capsule Take 1-2 g by mouth See admin instructions. Take 2 capsules (2 g) by mouth every morning and 1 capsule (1 g) every evening   metoprolol succinate (TOPROL-XL) 25 MG 24 hr tablet Take 0.5 tablets (12.5 mg total) by mouth every evening.   pantoprazole (PROTONIX) 40 MG tablet Take 1 tablet (40 mg total) by mouth daily.   vitamin B-12 (CYANOCOBALAMIN) 100 MCG tablet Take 100 mcg by mouth daily.   [DISCONTINUED] aspirin 81 MG tablet Take 81 mg by mouth daily.     Allergies:   Metformin and related, Allopurinol, Percocet [oxycodone-acetaminophen], Rabeprazole sodium, and Statins   Social History   Tobacco Use   Smoking status: Never Smoker   Smokeless tobacco: Never Used  Substance Use Topics   Alcohol use: No    Frequency: Never   Drug use: No     Family Hx: The patient's family history includes Diabetes in his brother and sister.  ROS:   Please see the history of present illness.    ROS All other systems reviewed and are negative.   EKGs/Labs/Other Test Reviewed:    EKG:  EKG is   ordered today.  The ekg ordered today demonstrates AFib, HR 62, TW inversions 2, 3, aVF, V4-6 (new)  Recent Labs: 02/18/2019: B Natriuretic Peptide 204.7; Magnesium 1.8 03/07/2019: ALT  41; BUN 22; Creatinine, Ser 1.25; Hemoglobin 13.5; Platelets 126; Potassium 4.1; Sodium 138   Recent Lipid Panel Lab Results  Component Value Date/Time   CHOL 163 02/19/2019 03:39 AM   CHOL 162 12/10/2017 09:39 AM   TRIG 127 02/19/2019 03:39 AM   HDL 45 02/19/2019 03:39 AM   HDL 37 (L) 12/10/2017 09:39 AM   CHOLHDL 3.6 02/19/2019 03:39 AM   LDLCALC 93 02/19/2019 03:39 AM   LDLCALC 104 (H) 12/10/2017 09:39 AM    Physical Exam:    VS:  BP 122/72    Pulse 62    Ht _0  (1.753 m)    Wt 177 lb (80.3 kg)    BMI 26.14 kg/m     Wt Readings from Last 3 Encounters:  03/07/19 177 lb 9.6  oz (80.6 kg)  03/07/19 177 lb (80.3 kg)  02/20/19 177 lb (80.3 kg)     Physical Exam  Constitutional: He is oriented to person, place, and time. He appears well-developed and well-nourished. No distress.  HENT:  Head: Normocephalic and atraumatic.  Eyes: No scleral icterus.  Neck: No JVD present. No thyromegaly present.  Cardiovascular: Normal rate. An irregularly irregular rhythm present.  No murmur heard. Pulmonary/Chest: Effort normal. He has no wheezes. He has no rales.  Abdominal: Soft. He exhibits no distension.  Musculoskeletal:        General: No edema.  Lymphadenopathy:    He has no cervical adenopathy.  Neurological: He is alert and oriented to person, place, and time.  Skin: Skin is warm and dry.  Psychiatric: He has a normal mood and affect.    ASSESSMENT & PLAN:    1. Coronary artery disease involving native coronary artery of native heart with unstable angina pectoris ([place]) History of CABG in 2002 and DES to the LCx in 2009.  He was recently admitted with a non-ST elevation myocardial infarction treated with a DES to the vein graft to the RPDA.  He has significant disease elsewhere in the vein graft to the obtuse marginal as well as the vein graft to the diagonal.  There is also progression of disease in the left main.  Intervention on these other areas was felt to be too high risk.   He did have improved symptoms after discharge but has since developed recurrent exertional angina.  He is also having some rest symptoms today in the office.  His ECG demonstrates inferolateral T wave inversions which appear to be new from his last tracing.  Therefore, I recommend admission to the hospital.  He was also seen by Dr. Marlou Porch (attending MD).  He agreed.  He is here alone today and will be transported to the hospital via EMS.  We will plan on obtaining serial enzymes and considering repeat cardiac catheterization.  2. Persistent atrial fibrillation Rate is controlled.  He has declined anticoagulation.  3. Essential hypertension The patient's blood pressure is controlled on his current regimen.  Continue current therapy.   4. Hyperlipidemia, unspecified hyperlipidemia type He is intolerant of statins.  Continue ezetimibe.  5. Type 2 diabetes mellitus with complication, without long-term current use of insulin (HCC) Recent A1c 7.6.  He has declined medical therapy.  6. Bilateral carotid artery disease, unspecified type ([place]) Follow-up carotid US will be due in January 2021.     Dispo:  Return for [facility] Follow Up.   Medication Adjustments/Labs and Tests Ordered: Current medicines are reviewed at length with the patient today.  Concerns regarding medicines are outlined above.  Tests Ordered: Orders Placed This Encounter  Procedures   EKG 12-Lead   Medication Changes: No orders of the defined types were placed in this encounter.   Signed, Richardson Dopp, PA-C  03/07/2019 2:44 PM    [facility] [place], [place], [place]  [address] Phone: [phone]; Fax: [phone]

## 2019-03-07 ENCOUNTER — Other Ambulatory Visit: Payer: Self-pay

## 2019-03-07 ENCOUNTER — Encounter: Payer: Self-pay | Admitting: Physician Assistant

## 2019-03-07 ENCOUNTER — Ambulatory Visit: Payer: Medicare Other | Admitting: Physician Assistant

## 2019-03-07 ENCOUNTER — Inpatient Hospital Stay (HOSPITAL_COMMUNITY)
Admission: AD | Admit: 2019-03-07 | Discharge: 2019-03-11 | DRG: 247 | Disposition: A | Discharge status: Home / Self Care | Payer: Medicare Other | Source: Ambulatory Visit | Attending: Cardiovascular Disease | Admitting: Cardiovascular Disease

## 2019-03-07 VITALS — BP 122/72 | HR 62 | Ht 69.0 in | Wt 177.0 lb

## 2019-03-07 DIAGNOSIS — Z833 Family history of diabetes mellitus: Secondary | ICD-10-CM

## 2019-03-07 DIAGNOSIS — Z951 Presence of aortocoronary bypass graft: Secondary | ICD-10-CM

## 2019-03-07 DIAGNOSIS — I252 Old myocardial infarction: Secondary | ICD-10-CM

## 2019-03-07 DIAGNOSIS — I25110 Atherosclerotic heart disease of native coronary artery with unstable angina pectoris: Secondary | ICD-10-CM

## 2019-03-07 DIAGNOSIS — I10 Essential (primary) hypertension: Secondary | ICD-10-CM

## 2019-03-07 DIAGNOSIS — I4819 Other persistent atrial fibrillation: Secondary | ICD-10-CM | POA: Diagnosis not present

## 2019-03-07 DIAGNOSIS — E118 Type 2 diabetes mellitus with unspecified complications: Secondary | ICD-10-CM

## 2019-03-07 DIAGNOSIS — Z955 Presence of coronary angioplasty implant and graft: Secondary | ICD-10-CM

## 2019-03-07 DIAGNOSIS — F419 Anxiety disorder, unspecified: Secondary | ICD-10-CM | POA: Diagnosis present

## 2019-03-07 DIAGNOSIS — Z7982 Long term (current) use of aspirin: Secondary | ICD-10-CM

## 2019-03-07 DIAGNOSIS — Z1159 Encounter for screening for other viral diseases: Secondary | ICD-10-CM

## 2019-03-07 DIAGNOSIS — Z888 Allergy status to other drugs, medicaments and biological substances status: Secondary | ICD-10-CM

## 2019-03-07 DIAGNOSIS — N179 Acute kidney failure, unspecified: Secondary | ICD-10-CM | POA: Diagnosis not present

## 2019-03-07 DIAGNOSIS — Z8546 Personal history of malignant neoplasm of prostate: Secondary | ICD-10-CM

## 2019-03-07 DIAGNOSIS — I779 Disorder of arteries and arterioles, unspecified: Secondary | ICD-10-CM

## 2019-03-07 DIAGNOSIS — E7800 Pure hypercholesterolemia, unspecified: Secondary | ICD-10-CM | POA: Diagnosis present

## 2019-03-07 DIAGNOSIS — I4821 Permanent atrial fibrillation: Secondary | ICD-10-CM | POA: Diagnosis present

## 2019-03-07 DIAGNOSIS — I2510 Atherosclerotic heart disease of native coronary artery without angina pectoris: Secondary | ICD-10-CM | POA: Diagnosis present

## 2019-03-07 DIAGNOSIS — K219 Gastro-esophageal reflux disease without esophagitis: Secondary | ICD-10-CM | POA: Diagnosis present

## 2019-03-07 DIAGNOSIS — Z79899 Other long term (current) drug therapy: Secondary | ICD-10-CM

## 2019-03-07 DIAGNOSIS — J449 Chronic obstructive pulmonary disease, unspecified: Secondary | ICD-10-CM | POA: Diagnosis present

## 2019-03-07 DIAGNOSIS — I4891 Unspecified atrial fibrillation: Secondary | ICD-10-CM | POA: Diagnosis present

## 2019-03-07 DIAGNOSIS — I739 Peripheral vascular disease, unspecified: Secondary | ICD-10-CM

## 2019-03-07 DIAGNOSIS — E785 Hyperlipidemia, unspecified: Secondary | ICD-10-CM | POA: Diagnosis present

## 2019-03-07 DIAGNOSIS — Z7902 Long term (current) use of antithrombotics/antiplatelets: Secondary | ICD-10-CM

## 2019-03-07 DIAGNOSIS — Z885 Allergy status to narcotic agent status: Secondary | ICD-10-CM

## 2019-03-07 DIAGNOSIS — E1142 Type 2 diabetes mellitus with diabetic polyneuropathy: Secondary | ICD-10-CM | POA: Diagnosis present

## 2019-03-07 LAB — CBC WITH DIFFERENTIAL/PLATELET
Abs Immature Granulocytes: 0.02 10*3/uL (ref 0.00–0.07)
Basophils Absolute: 0 10*3/uL (ref 0.0–0.1)
Basophils Relative: 0 %
Eosinophils Absolute: 0.2 10*3/uL (ref 0.0–0.5)
Eosinophils Relative: 4 %
HCT: 38.9 % — ABNORMAL LOW (ref 39.0–52.0)
Hemoglobin: 13.5 g/dL (ref 13.0–17.0)
Immature Granulocytes: 0 %
Lymphocytes Relative: 21 %
Lymphs Abs: 1.1 10*3/uL (ref 0.7–4.0)
MCH: 31.3 pg (ref 26.0–34.0)
MCHC: 34.7 g/dL (ref 30.0–36.0)
MCV: 90.3 fL (ref 80.0–100.0)
Monocytes Absolute: 0.4 10*3/uL (ref 0.1–1.0)
Monocytes Relative: 8 %
Neutro Abs: 3.4 10*3/uL (ref 1.7–7.7)
Neutrophils Relative %: 67 %
Platelets: 126 10*3/uL — ABNORMAL LOW (ref 150–400)
RBC: 4.31 MIL/uL (ref 4.22–5.81)
RDW: 12.9 % (ref 11.5–15.5)
WBC: 5.1 10*3/uL (ref 4.0–10.5)
nRBC: 0 % (ref 0.0–0.2)

## 2019-03-07 LAB — COMPREHENSIVE METABOLIC PANEL
ALT: 41 U/L (ref 0–44)
AST: 44 U/L — ABNORMAL HIGH (ref 15–41)
Albumin: 3.8 g/dL (ref 3.5–5.0)
Alkaline Phosphatase: 31 U/L — ABNORMAL LOW (ref 38–126)
Anion gap: 12 (ref 5–15)
BUN: 22 mg/dL (ref 8–23)
CO2: 21 mmol/L — ABNORMAL LOW (ref 22–32)
Calcium: 9.2 mg/dL (ref 8.9–10.3)
Chloride: 105 mmol/L (ref 98–111)
Creatinine, Ser: 1.25 mg/dL — ABNORMAL HIGH (ref 0.61–1.24)
GFR calc Af Amer: 60 mL/min — ABNORMAL LOW (ref >60)
GFR calc non Af Amer: 51 mL/min — ABNORMAL LOW (ref >60)
Glucose, Bld: 156 mg/dL — ABNORMAL HIGH (ref 70–99)
Potassium: 4.1 mmol/L (ref 3.5–5.1)
Sodium: 138 mmol/L (ref 135–145)
Total Bilirubin: 1.1 mg/dL (ref 0.3–1.2)
Total Protein: 7 g/dL (ref 6.5–8.1)

## 2019-03-07 LAB — HEPARIN LEVEL (UNFRACTIONATED): Heparin Unfractionated: 0.43 IU/mL (ref 0.30–0.70)

## 2019-03-07 LAB — TROPONIN I
Troponin I: <0.03 ng/mL (ref <0.03)
Troponin I: <0.03 ng/mL (ref <0.03)
Troponin I: <0.03 ng/mL (ref <0.03)

## 2019-03-07 LAB — GLUCOSE, CAPILLARY
Glucose-Capillary: 133 mg/dL — ABNORMAL HIGH (ref 70–99)
Glucose-Capillary: 152 mg/dL — ABNORMAL HIGH (ref 70–99)

## 2019-03-07 LAB — PROTIME-INR
INR: 1.3 — ABNORMAL HIGH (ref 0.8–1.2)
Prothrombin Time: 15.6 seconds — ABNORMAL HIGH (ref 11.4–15.2)

## 2019-03-07 LAB — APTT: aPTT: 35 seconds (ref 24–36)

## 2019-03-07 MED ORDER — DIPHENHYDRAMINE HCL 25 MG PO CAPS
25.0000 mg | ORAL_CAPSULE | Freq: Every evening | ORAL | Status: DC | PRN
  Administered 2019-03-08 – 2019-03-10 (×4): 25 mg via ORAL
  Filled 2019-03-07 (×4): qty 1

## 2019-03-07 MED ORDER — ASPIRIN EC 81 MG PO TBEC
81.0000 mg | DELAYED_RELEASE_TABLET | Freq: Every day | ORAL | Status: DC
  Administered 2019-03-08 – 2019-03-11 (×4): 81 mg via ORAL
  Filled 2019-03-07 (×2): qty 1

## 2019-03-07 MED ORDER — HEPARIN BOLUS VIA INFUSION
4000.0000 [IU] | Freq: Once | INTRAVENOUS | Status: AC
  Administered 2019-03-07: 4000 [IU] via INTRAVENOUS
  Filled 2019-03-07: qty 4000

## 2019-03-07 MED ORDER — PANTOPRAZOLE SODIUM 40 MG PO TBEC
40.0000 mg | DELAYED_RELEASE_TABLET | Freq: Every day | ORAL | Status: DC
  Administered 2019-03-08 – 2019-03-11 (×4): 40 mg via ORAL
  Filled 2019-03-07 (×4): qty 1

## 2019-03-07 MED ORDER — HEPARIN (PORCINE) 25000 UT/250ML-% IV SOLN
1000.0000 [IU]/h | INTRAVENOUS | Status: DC
  Administered 2019-03-07 – 2019-03-09 (×3): 1000 [IU]/h via INTRAVENOUS
  Filled 2019-03-07 (×3): qty 250

## 2019-03-07 MED ORDER — METOPROLOL SUCCINATE ER 25 MG PO TB24
12.5000 mg | ORAL_TABLET | Freq: Every evening | ORAL | Status: DC
  Administered 2019-03-07 – 2019-03-10 (×4): 12.5 mg via ORAL
  Filled 2019-03-07 (×4): qty 1

## 2019-03-07 MED ORDER — VITAMIN B-12 100 MCG PO TABS
100.0000 ug | ORAL_TABLET | Freq: Every day | ORAL | Status: DC
  Administered 2019-03-08 – 2019-03-11 (×4): 100 ug via ORAL
  Filled 2019-03-07 (×4): qty 1

## 2019-03-07 MED ORDER — ONDANSETRON HCL 4 MG/2ML IJ SOLN: 4.0000 mg | Freq: Four times a day (QID) | INTRAMUSCULAR | Status: DC | PRN

## 2019-03-07 MED ORDER — OMEGA-3-ACID ETHYL ESTERS 1 G PO CAPS
1.0000 g | ORAL_CAPSULE | Freq: Two times a day (BID) | ORAL | Status: DC
  Administered 2019-03-07 – 2019-03-11 (×8): 1 g via ORAL
  Filled 2019-03-07 (×8): qty 1

## 2019-03-07 MED ORDER — OMEGA-3 FATTY ACIDS 1000 MG PO CAPS: 1.0000 g | ORAL_CAPSULE | Freq: Two times a day (BID) | ORAL | Status: DC

## 2019-03-07 MED ORDER — NITROGLYCERIN 0.4 MG SL SUBL: 0.4000 mg | SUBLINGUAL_TABLET | SUBLINGUAL | Status: DC | PRN

## 2019-03-07 MED ORDER — VITAMIN C 500 MG PO TABS
1000.0000 mg | ORAL_TABLET | Freq: Every day | ORAL | Status: DC
  Administered 2019-03-08 – 2019-03-11 (×4): 1000 mg via ORAL
  Filled 2019-03-07 (×4): qty 2

## 2019-03-07 MED ORDER — ASPIRIN 81 MG PO TABS: 81.0000 mg | ORAL_TABLET | Freq: Every day | ORAL | Status: DC

## 2019-03-07 MED ORDER — CLOPIDOGREL BISULFATE 75 MG PO TABS
75.0000 mg | ORAL_TABLET | Freq: Every day | ORAL | Status: DC
  Administered 2019-03-08 – 2019-03-11 (×4): 75 mg via ORAL
  Filled 2019-03-07 (×4): qty 1

## 2019-03-07 MED ORDER — ACETAMINOPHEN 325 MG PO TABS: 650.0000 mg | ORAL_TABLET | ORAL | Status: DC | PRN

## 2019-03-07 MED ORDER — EZETIMIBE 10 MG PO TABS
10.0000 mg | ORAL_TABLET | Freq: Every day | ORAL | Status: DC
  Administered 2019-03-08 – 2019-03-11 (×4): 10 mg via ORAL
  Filled 2019-03-07 (×4): qty 1

## 2019-03-07 NOTE — Care Management Important Message (Signed)
Important Message  Patient Details  Name: William Weber MRN: [id number] Date of Birth: 1930-01-13   Medicare Important Message Given:  Yes - Important Message mailed due to current National Emergency  Please disregard message it was on the wrong patient    [name] 03/07/2019, 4:18 PM

## 2019-03-07 NOTE — H&P (Signed)
Cardiology Admission H&P Note:    Date:  03/07/2019   ID:  William Weber, DOB 27-Dec-1929, MRN [id number]  PCP:  William Sacramento, MD  Cardiologist:  William Chandler, MD   Electrophysiologist:  None   Referring MD: William Sacramento, MD   Chief Complaint  Patient presents with  . Chest Pain    History of Present Illness:    William Weber is a 85 y.o. male with:  Coronary artery disease  Status post CABG in 2002  PCI 2009: DES to the LCx  NSTEMI 02/2019: PCI - DES to S-RPDA  Paroxysmal atrial fibrillation  Patient has declined anticoagulation  Carotid artery disease  Diabetes mellitus  Hypertension  Hyperlipidemia  Statin intolerance  COPD  William Weber was last seen in clinic in January 2020 by William Barrios, PA-C.  He was admitted 6/2-6/4 with a NSTEMI.  Cardiac catheterization demonstrated significant underlying three-vessel CAD with patent LIMA-LAD.  There was severe disease affecting the SVG-OM, SVG-DX, SVG-RPDA and severe in-stent restenosis in the native LCx and new severe stenosis in the mid left main.  William Weber performed this procedure.  It was difficult to discern the exact culprit for the NSTEMI.  It was not felt that successful PCI of the SVG-OM would result in long-term patency.  The disease in the SVG-DX was at the anastomosis with extension into the proximal portion of the diagonal branch.  It was noted there was significant vessel mismatch with the native diagonal.  PCI of the left main and LCx would be challenging given the 90 degree takeoff of the LCx from the left main with calcifications and previous stents in the proximal LCx.  After consultation with his colleagues, it was decided to proceed with PCI of the vein graft with the largest territory and least procedure risk.  Therefore, he underwent DES to the SVG-RPDA.  Post PCI course was uneventful.  He called in recently with recurrent anginal symptoms.  Therefore, he was added on the  schedule for further evaluation.  He is here alone today.  For the first 2 days after discharge, he had no real chest symptoms.  However, since then he has developed chest discomfort with mild to moderate exertion.  He has developed chest burning with washing his car as well as with attaching a hose to the down spout to put water in his pond.  He has associated shortness of breath and left arm discomfort.  He has not had syncope, paroxysmal nocturnal dyspnea, leg swelling.  He has not had any bleeding issues.  Prior CV studies:   The following studies were reviewed today:   Echocardiogram 02/19/2019 EF 60-65, mild basal septal hypertrophy, mild LAE, moderate MAC  Cardiac catheterization 02/19/2019 LM mid 95 LAD proximal 100; D1 95 LCx ostial stent 95 ISR; OM1 ostial 80 RCA mid 70, distal 50; RPAV 70 SVG-D1 40 SVG-OM2 proximal 90, mid 85 SVG-RPDA distal 95 LIMA-LAD patent EF 55-65 PCI: 3 x 15 mm resolute Onyx DES to the SVG-RPDA     Carotid US 09/30/2018 R 1-39; L 60-79 Repeat 12 months  Past Medical History:  Diagnosis Date  . Anxiety    mild  . Atrial fibrillation ([place])   . CAD (coronary artery disease)    DES proximal circumflex January, 2009  /   nuclear, December, 2011, no ischemia, ejection fraction 70%  . Carotid artery disease ([place])    Doppler, August, 2011, zero 51% RIC A., 76-16% LICA, stable  . Chronic headaches   .  COPD (chronic obstructive pulmonary disease) ([place])   . Diabetic peripheral neuropathy ([place]) 01/22/2018  . Dyslipidemia    statin intolerance  . Ejection fraction    EF 70%, nuclear, December, 2011  . GERD (gastroesophageal reflux disease)   . Hiatal hernia   . Hx of CABG    William Weber, 2002  . Hypercholesterolemia   . Hypertension   . MGUS (monoclonal gammopathy of unknown significance) 01/22/2018  . Prostate cancer ([place])    TUR and implant  . Rash    2009, etiology not clear, not from Plavix, Claritin helped  . Statin intolerance    Surgical Hx:  The patient  has a past surgical history that includes Bladder surgery; Cardiac surgery; Prostate surgery; LEFT HEART CATH AND CORS/GRAFTS ANGIOGRAPHY (N/A, 02/19/2019); and CORONARY STENT INTERVENTION (N/A, 02/19/2019).   Current Medications: Current Meds  Medication Sig  . Ascorbic Acid (VITAMIN C WITH ROSE HIPS) 1000 MG tablet Take 1,000 mg by mouth daily.  Marland Kitchen aspirin 81 MG tablet Take 81 mg by mouth daily.  . clopidogrel (PLAVIX) 75 MG tablet Take 1 tablet (75 mg total) by mouth daily with breakfast.  . DiphenhydrAMINE HCl (BENADRYL ALLERGY PO) Take 1 tablet by mouth at bedtime.   Marland Kitchen ezetimibe (ZETIA) 10 MG tablet Take 1 tablet (10 mg total) by mouth daily.  . fish oil-omega-3 fatty acids 1000 MG capsule Take 1-2 g by mouth 2 (two) times a day. Take 2 capsules in the morning and Take 1 capsule in the evening  . metoprolol succinate (TOPROL-XL) 25 MG 24 hr tablet Take 0.5 tablets (12.5 mg total) by mouth every evening.  . pantoprazole (PROTONIX) 40 MG tablet Take 1 tablet (40 mg total) by mouth daily.  . vitamin B-12 (CYANOCOBALAMIN) 100 MCG tablet Take 100 mcg by mouth daily.     Allergies:   Allopurinol, Metformin and related, Percocet [oxycodone-acetaminophen], Rabeprazole sodium, and Statins   Social History   Tobacco Use  . Smoking status: Never Smoker  . Smokeless tobacco: Never Used  Substance Use Topics  . Alcohol use: No    Frequency: Never  . Drug use: No     Family Hx: The patient's family history includes Diabetes in his brother and sister.  ROS:   Please see the history of present illness.    ROS All other systems reviewed and are negative.   EKGs/Labs/Other Test Reviewed:    EKG:  EKG is   ordered today.  The ekg ordered today demonstrates AFib, HR 62, TW inversions 2, 3, aVF, V4-6 (new)  Recent Labs: 02/18/2019: ALT 43; B Natriuretic Peptide 204.7; Magnesium 1.8 02/20/2019: BUN 24; Creatinine, Ser 1.42; Hemoglobin 12.2; Platelets 103; Potassium 3.8; Sodium 137    Recent Lipid Panel Lab Results  Component Value Date/Time   CHOL 163 02/19/2019 03:39 AM   CHOL 162 12/10/2017 09:39 AM   TRIG 127 02/19/2019 03:39 AM   HDL 45 02/19/2019 03:39 AM   HDL 37 (L) 12/10/2017 09:39 AM   CHOLHDL 3.6 02/19/2019 03:39 AM   LDLCALC 93 02/19/2019 03:39 AM   LDLCALC 104 (H) 12/10/2017 09:39 AM    Physical Exam:    VS:  BP 122/72   Pulse 62   Ht _0  (1.753 m)   Wt 177 lb (80.3 kg)   BMI 26.14 kg/m     Wt Readings from Last 3 Encounters:  03/07/19 177 lb (80.3 kg)  02/20/19 177 lb (80.3 kg)  09/25/18 190 lb (86.2 kg)     Physical  Exam  Constitutional: He is oriented to person, place, and time. He appears well-developed and well-nourished. No distress.  HENT:  Head: Normocephalic and atraumatic.  Eyes: No scleral icterus.  Neck: No JVD present. No thyromegaly present.  Cardiovascular: Normal rate. An irregularly irregular rhythm present.  No murmur heard. Pulmonary/Chest: Effort normal. He has no wheezes. He has no rales.  Abdominal: Soft. He exhibits no distension.  Musculoskeletal:        General: No edema.  Lymphadenopathy:    He has no cervical adenopathy.  Neurological: He is alert and oriented to person, place, and time.  Skin: Skin is warm and dry.  Psychiatric: He has a normal mood and affect.    ASSESSMENT & PLAN:    1. Coronary artery disease involving native coronary artery of native heart with unstable angina pectoris ([place]) History of CABG in 2002 and DES to the LCx in 2009.  He was recently admitted with a non-ST elevation myocardial infarction treated with a DES to the vein graft to the RPDA.  He has significant disease elsewhere in the vein graft to the obtuse marginal as well as the vein graft to the diagonal.  There is also progression of disease in the left main.  Intervention on these other areas was felt to be too high risk.  He did have improved symptoms after discharge but has since developed recurrent exertional angina.   He is also having some rest symptoms today in the office.  His ECG demonstrates inferolateral T wave inversions which appear to be new from his last tracing.  Therefore, I recommend admission to the hospital.  He was also seen by Dr. Marlou Porch (attending MD).  He agreed.  He is here alone today and will be transported to the hospital via EMS.  We will plan on obtaining serial enzymes and considering repeat cardiac catheterization.  2. Persistent atrial fibrillation Rate is controlled.  He has declined anticoagulation.  3. Essential hypertension The patient's blood pressure is controlled on his current regimen.  Continue current therapy.   4. Hyperlipidemia, unspecified hyperlipidemia type He is intolerant of statins.  Continue ezetimibe.  5. Type 2 diabetes mellitus with complication, without long-term current use of insulin (HCC) Recent A1c 7.6.  He has declined medical therapy.  6. Bilateral carotid artery disease, unspecified type ([place]) Follow-up carotid US will be due in January 2021.     Dispo:  Return for [facility] Follow Up.   Medication Adjustments/Labs and Tests Ordered: Current medicines are reviewed at length with the patient today.  Concerns regarding medicines are outlined above.  Tests Ordered: No orders of the defined types were placed in this encounter.  Medication Changes: No orders of the defined types were placed in this encounter.  Severity of Illness: The appropriate patient status for this patient is OBSERVATION. Observation status is judged to be reasonable and necessary in order to provide the required intensity of service to ensure the patient's safety. The patient's presenting symptoms, physical exam findings, and initial radiographic and laboratory data in the context of their medical condition is felt to place them at decreased risk for further clinical deterioration. Furthermore, it is anticipated that the patient will be medically stable for discharge from  the hospital within 2 midnights of admission. The following factors support the patient status of observation.   " The patient's presenting symptoms include chest pain, shortness of breath. " The physical exam findings include AFib, TW inversions on ECG. " The initial radiographic and laboratory  data are n/a   Signed, Richardson Dopp, PA-C  03/07/2019 9:38 AM    [facility] [place], [place], [place]  [address] Phone: [phone]; Fax: [phone]

## 2019-03-07 NOTE — Progress Notes (Signed)
[place] for heparin Indication: chest pain/ACS  Allergies  Allergen Reactions  . Metformin And Related Diarrhea and Other (See Comments)    flatulence  . Allopurinol Other (See Comments)    Painful skin nodules  . Percocet [Oxycodone-Acetaminophen] Other (See Comments)    Unknown reaction  . Rabeprazole Sodium Other (See Comments)    Unknown reaction  . Statins Other (See Comments)    Hmg-Coa Reductase inhibitors, Myalgias (intolerance)    Patient Measurements: Height: 5\' 9"  (175.3 cm) Weight: 177 lb 9.6 oz (80.6 kg) IBW/kg (Calculated) : 70.7 Heparin Dosing Weight: 80.3kg  Vital Signs: Temp: 98.1 F (36.7 C) (06/19 2057) Temp Source: Oral (06/19 2057) BP: 139/56 (06/19 2057) Pulse Rate: 69 (06/19 2057)  Labs: Recent Labs    03/07/19 1125 03/07/19 1615 03/07/19 2035  HGB 13.5  --   --   HCT 38.9*  --   --   PLT 126*  --   --   APTT 35  --   --   LABPROT 15.6*  --   --   INR 1.3*  --   --   HEPARINUNFRC  --   --  0.43  CREATININE 1.25*  --   --   TROPONINI <0.03 <0.03  --     Estimated Creatinine Clearance: 41.6 mL/min (A) (by C-G formula based on SCr of 1.25 mg/dL (H)).     Assessment: 35 yom presented to the ED with CP after recent PCI. To start IV heparin. Baseline labs are pending. Platelets have been slightly low in the recent past so will need to monitor. He is not on anticoagulation PTA.  Initial heparin level therapeutic  Goal of Therapy:  Heparin level 0.3-0.7 units/ml Monitor platelets by anticoagulation protocol: Yes   Plan:  Heparin at 1000 units / hr Daily heparin level and CBC  Tad Moore 03/07/2019,9:31 PM

## 2019-03-07 NOTE — Patient Instructions (Signed)
You have been reccommended to be admitted to [facility] for further evaluation of unstable chest pain

## 2019-03-07 NOTE — Progress Notes (Signed)
ANTICOAGULATION CONSULT NOTE - Initial Consult  Pharmacy Consult for heparin Indication: chest pain/ACS  Allergies  Allergen Reactions  . Allopurinol Other (See Comments)    Painful skin nodules  . Metformin And Related Diarrhea    GAS  . Percocet [Oxycodone-Acetaminophen]   . Rabeprazole Sodium   . Statins     Hmg-Coa Reductase inhibitors, Myalgias (intolerance)    Patient Measurements:   Heparin Dosing Weight: 80.3kg  Vital Signs: BP: 122/72 (06/19 0856) Pulse Rate: 62 (06/19 0856)  Labs: No results for input(s): HGB, HCT, PLT, APTT, LABPROT, INR, HEPARINUNFRC, HEPRLOWMOCWT, CREATININE, CKTOTAL, CKMB, TROPONINI in the last 72 hours.  Estimated Creatinine Clearance: 36.7 mL/min (A) (by C-G formula based on SCr of 1.42 mg/dL (H)).   Medical History: Past Medical History:  Diagnosis Date  . Anxiety    mild  . Atrial fibrillation ([place])   . CAD (coronary artery disease)    DES proximal circumflex January, 2009  /   nuclear, December, 2011, no ischemia, ejection fraction 70%  . Carotid artery disease ([place])    Doppler, August, 2011, zero 57% RIC A., 32-20% LICA, stable  . Chronic headaches   . COPD (chronic obstructive pulmonary disease) ([place])   . Diabetic peripheral neuropathy ([place]) 01/22/2018  . Dyslipidemia    statin intolerance  . Ejection fraction    EF 70%, nuclear, December, 2011  . GERD (gastroesophageal reflux disease)   . Hiatal hernia   . Hx of CABG    Hendrickson, 2002  . Hypercholesterolemia   . Hypertension   . MGUS (monoclonal gammopathy of unknown significance) 01/22/2018  . Prostate cancer ([place])    TUR and implant  . Rash    2009, etiology not clear, not from Plavix, Claritin helped  . Statin intolerance     Medications:  Infusions:  . heparin      Assessment: 52 yom presented to the ED with CP after recent PCI. To start IV heparin. Baseline labs are pending. Platelets have been slightly low in the recent past so will need to monitor. He is  not on anticoagulation PTA.  Goal of Therapy:  Heparin level 0.3-0.7 units/ml Monitor platelets by anticoagulation protocol: Yes   Plan:  Heparin bolus 4000 units IV x 1 Heparin gtt 1000 units/hr Check an 8 hr heparin level Daily heparin level and CBC  Jermayne Sweeney, Rande Lawman 03/07/2019,11:01 AM

## 2019-03-07 NOTE — Care Management Important Message (Signed)
Important Message  Patient Details  Name: MELCHOR KIRCHGESSNER MRN: [id number] Date of Birth: 02/28/1930   Medicare Important Message Given:  Yes - Important Message mailed due to current National Emergency   Verbal consent obtained due to current National Emergency  Relationship to patient: Family Member Contact Name: Laurene Footman Call Date: 03/07/19  Time: 1615 Phone: [phone] Outcome: Spoke with contact Important Message mailed to: Patient address on file  Sister did not want it mailed to her advised to mail to patient address.   Luverne Zerkle  03/07/2019, 4:16 PM

## 2019-03-07 NOTE — Telephone Encounter (Signed)
See my office note from 03/07/2019. Patient was admitted to [facility]. Richardson Dopp, PA-C    03/07/2019 2:51 PM

## 2019-03-08 DIAGNOSIS — Z1159 Encounter for screening for other viral diseases: Secondary | ICD-10-CM | POA: Diagnosis not present

## 2019-03-08 DIAGNOSIS — I4821 Permanent atrial fibrillation: Secondary | ICD-10-CM | POA: Diagnosis not present

## 2019-03-08 DIAGNOSIS — N179 Acute kidney failure, unspecified: Secondary | ICD-10-CM | POA: Diagnosis not present

## 2019-03-08 DIAGNOSIS — Z888 Allergy status to other drugs, medicaments and biological substances status: Secondary | ICD-10-CM | POA: Diagnosis not present

## 2019-03-08 DIAGNOSIS — Z7982 Long term (current) use of aspirin: Secondary | ICD-10-CM | POA: Diagnosis not present

## 2019-03-08 DIAGNOSIS — E7800 Pure hypercholesterolemia, unspecified: Secondary | ICD-10-CM | POA: Diagnosis not present

## 2019-03-08 DIAGNOSIS — E785 Hyperlipidemia, unspecified: Secondary | ICD-10-CM | POA: Diagnosis not present

## 2019-03-08 DIAGNOSIS — I25110 Atherosclerotic heart disease of native coronary artery with unstable angina pectoris: Secondary | ICD-10-CM | POA: Diagnosis not present

## 2019-03-08 DIAGNOSIS — Z885 Allergy status to narcotic agent status: Secondary | ICD-10-CM | POA: Diagnosis not present

## 2019-03-08 DIAGNOSIS — I25710 Atherosclerosis of autologous vein coronary artery bypass graft(s) with unstable angina pectoris: Secondary | ICD-10-CM | POA: Diagnosis not present

## 2019-03-08 DIAGNOSIS — Z833 Family history of diabetes mellitus: Secondary | ICD-10-CM | POA: Diagnosis not present

## 2019-03-08 DIAGNOSIS — I4811 Longstanding persistent atrial fibrillation: Secondary | ICD-10-CM | POA: Diagnosis not present

## 2019-03-08 DIAGNOSIS — Z8546 Personal history of malignant neoplasm of prostate: Secondary | ICD-10-CM | POA: Diagnosis not present

## 2019-03-08 DIAGNOSIS — Z79899 Other long term (current) drug therapy: Secondary | ICD-10-CM | POA: Diagnosis not present

## 2019-03-08 DIAGNOSIS — E1142 Type 2 diabetes mellitus with diabetic polyneuropathy: Secondary | ICD-10-CM | POA: Diagnosis not present

## 2019-03-08 DIAGNOSIS — K219 Gastro-esophageal reflux disease without esophagitis: Secondary | ICD-10-CM | POA: Diagnosis not present

## 2019-03-08 DIAGNOSIS — I10 Essential (primary) hypertension: Secondary | ICD-10-CM | POA: Diagnosis not present

## 2019-03-08 DIAGNOSIS — Z951 Presence of aortocoronary bypass graft: Secondary | ICD-10-CM | POA: Diagnosis not present

## 2019-03-08 DIAGNOSIS — F419 Anxiety disorder, unspecified: Secondary | ICD-10-CM | POA: Diagnosis not present

## 2019-03-08 DIAGNOSIS — Z7902 Long term (current) use of antithrombotics/antiplatelets: Secondary | ICD-10-CM | POA: Diagnosis not present

## 2019-03-08 DIAGNOSIS — J449 Chronic obstructive pulmonary disease, unspecified: Secondary | ICD-10-CM | POA: Diagnosis not present

## 2019-03-08 DIAGNOSIS — I252 Old myocardial infarction: Secondary | ICD-10-CM | POA: Diagnosis not present

## 2019-03-08 LAB — CBC
HCT: 38.7 % — ABNORMAL LOW (ref 39.0–52.0)
Hemoglobin: 13.2 g/dL (ref 13.0–17.0)
MCH: 30.8 pg (ref 26.0–34.0)
MCHC: 34.1 g/dL (ref 30.0–36.0)
MCV: 90.4 fL (ref 80.0–100.0)
Platelets: 113 10*3/uL — ABNORMAL LOW (ref 150–400)
RBC: 4.28 MIL/uL (ref 4.22–5.81)
RDW: 13.1 % (ref 11.5–15.5)
WBC: 4.4 10*3/uL (ref 4.0–10.5)
nRBC: 0 % (ref 0.0–0.2)

## 2019-03-08 LAB — NOVEL CORONAVIRUS, NAA (HOSP ORDER, SEND-OUT TO REF LAB; TAT 18-24 HRS): SARS-CoV-2, NAA: NOT DETECTED

## 2019-03-08 LAB — GLUCOSE, CAPILLARY
Glucose-Capillary: 142 mg/dL — ABNORMAL HIGH (ref 70–99)
Glucose-Capillary: 184 mg/dL — ABNORMAL HIGH (ref 70–99)
Glucose-Capillary: 137 mg/dL — ABNORMAL HIGH (ref 70–99)
Glucose-Capillary: 154 mg/dL — ABNORMAL HIGH (ref 70–99)

## 2019-03-08 LAB — HEPARIN LEVEL (UNFRACTIONATED): Heparin Unfractionated: 0.63 IU/mL (ref 0.30–0.70)

## 2019-03-08 MED ORDER — ISOSORBIDE MONONITRATE ER 30 MG PO TB24
30.0000 mg | ORAL_TABLET | Freq: Every day | ORAL | Status: DC
  Administered 2019-03-08 – 2019-03-11 (×4): 30 mg via ORAL
  Filled 2019-03-08 (×4): qty 1

## 2019-03-08 NOTE — Progress Notes (Signed)
Progress Note  Patient Name: William Weber Date of Encounter: 03/08/2019   Primary Cardiologist: Lauree Chandler, MD   Subjective   Pt had had return of chest burning with minimal exertion like he was having prior to his recent stent. He is having no chest burning here as he is not exerting at all.   Inpatient Medications    Scheduled Meds:  aspirin EC  81 mg Oral Daily   clopidogrel  75 mg Oral Q breakfast   ezetimibe  10 mg Oral Daily   metoprolol succinate  12.5 mg Oral QPM   omega-3 acid ethyl esters  1 g Oral BID   pantoprazole  40 mg Oral Daily   vitamin B-12  100 mcg Oral Daily   vitamin C with rose hips  1,000 mg Oral Daily   Continuous Infusions:  heparin 1,000 Units/hr (03/08/19 0715)   PRN Meds: acetaminophen, diphenhydrAMINE, nitroGLYCERIN, ondansetron (ZOFRAN) IV   Vital Signs    Vitals:   03/07/19 1114 03/07/19 1833 03/07/19 2057 03/08/19 0557  BP: (!) 142/69 137/79 (!) 139/56 111/64  Pulse:  72 69 66  Resp:    20  Temp: 97.7 F (36.5 C)  98.1 F (36.7 C) 97.7 F (36.5 C)  TempSrc: Oral  Oral Oral  SpO2: 99%  100% 100%  Weight: 80.6 kg   78.2 kg  Height: 5\' 9"  (1.753 m)       Intake/Output Summary (Last 24 hours) at 03/08/2019 0953 Last data filed at 03/08/2019 0600 Gross per 24 hour  Intake 240 ml  Output 1325 ml  Net -1085 ml   Last 3 Weights 03/08/2019 03/07/2019 03/07/2019  Weight (lbs) 172 lb 4.8 oz 177 lb 9.6 oz 177 lb  Weight (kg) 78.155 kg 80.559 kg 80.287 kg      Telemetry    Afib upper 50's-low 60's - Personally Reviewed  ECG    No new tracings - Personally Reviewed  Physical Exam   GEN: No acute distress.   Neck: No JVD Cardiac: Irregularly irregular rhythm, no murmurs, rubs, or gallops.  Respiratory: Clear to auscultation bilaterally. GI: Soft, nontender, non-distended  MS: No edema; No deformity. Neuro:  Nonfocal  Psych: Normal affect   Labs    Chemistry Recent Labs  Lab 03/07/19 1125  NA  138  K 4.1  CL 105  CO2 21*  GLUCOSE 156*  BUN 22  CREATININE 1.25*  CALCIUM 9.2  PROT 7.0  ALBUMIN 3.8  AST 44*  ALT 41  ALKPHOS 31*  BILITOT 1.1  GFRNONAA 51*  GFRAA 60*  ANIONGAP 12     Hematology Recent Labs  Lab 03/07/19 1125 03/08/19 0815  WBC 5.1 4.4  RBC 4.31 4.28  HGB 13.5 13.2  HCT 38.9* 38.7*  MCV 90.3 90.4  MCH 31.3 30.8  MCHC 34.7 34.1  RDW 12.9 13.1  PLT 126* 113*    Cardiac Enzymes Recent Labs  Lab 03/07/19 1125 03/07/19 1615 03/07/19 2225  TROPONINI <0.03 <0.03 <0.03   No results for input(s): TROPIPOC in the last 168 hours.   BNPNo results for input(s): BNP, PROBNP in the last 168 hours.   DDimer No results for input(s): DDIMER in the last 168 hours.   Radiology    No results found.  Cardiac Studies   CORONARY STENT INTERVENTION  02/20/2019  LEFT HEART CATH AND CORS/GRAFTS ANGIOGRAPHY  Conclusion   Ost Cx to Prox Cx lesion is 95% stenosed.  Mid LM lesion is 95% stenosed.  Ost 1st  Mrg lesion is 80% stenosed.  Prox LAD lesion is 100% stenosed.  Mid RCA lesion is 70% stenosed.  Dist RCA lesion is 50% stenosed.  Post Atrio lesion is 70% stenosed.  SVG.  1st Diag lesion is 95% stenosed.  Origin to Prox Graft lesion is 40% stenosed.  Origin to Prox Graft lesion is 90% stenosed.  Mid Graft to Dist Graft lesion is 85% stenosed.  Dist Graft lesion is 95% stenosed.  Post intervention, there is a 0% residual stenosis.  A drug-eluting stent was successfully placed using a STENT RESOLUTE ONYX 3.0X15.  The left ventricular systolic function is normal.  LV end diastolic pressure is normal.  The left ventricular ejection fraction is 55-65% by visual estimate.  LIMA and is normal in caliber.   1.  Significant underlying three-vessel coronary artery disease with patent LIMA to LAD.  Severe disease affecting SVG to OM, SVG to diagonal and SVG to right PDA.  In addition, there is severe in-stent restenosis in the native left  circumflex with new severe stenosis in the mid left main coronary artery. 2.  Normal LV systolic function and left ventricular end-diastolic pressure. 3.  Successful angioplasty and drug-eluting stent placement to the SVG to right PDA  Recommendations: This was a difficult situation as it was difficult to know the culprit for non-ST elevation myocardial infarction.  I discussed the case with Dr. Angelena Form and I decided to intervene on the SVG graft with the largest territory and least procedure risk.  SVG to OM is diffusely diseased and even with successful PCI, I do not think it will stay open long-term.  PCI of SVG to diagonal is possible although the disease is at the anastomosis with extension into the proximal portion of the diagonal branch.  There is significant vessel mismatch and the native diagonal is likely 2.25 mm in diameter.  PCI of the left main and left circumflex is also challenging given the 90 degree takeoff of the left circumflex from the left main with calcifications and previous stent in the proximal left circumflex.   Echocardiogram 02/19/2019 IMPRESSIONS   1. The left ventricle has normal systolic function with an ejection fraction of 60-65%. The cavity size was normal. Mild basal septal hypertrophy. Left ventricular diastolic function could not be evaluated secondary to atrial fibrillation. No evidence  of left ventricular regional wall motion abnormalities.  2. The right ventricle has normal systolic function. The cavity was normal. There is no increase in right ventricular wall thickness.  3. Left atrial size was mildly dilated.  4. There is moderate mitral annular calcification present.  5. The inferior vena cava was dilated in size with >50% respiratory variability.    Patient Profile     85 y.o. male with hx of CAD s/p CABG 2002, DES to [facility] 2009, NSTEMI 02/2019, HTN, HLD, permanent atrial fibrillation-refuses OAC, DM and COPD. Admitted for worsening exertional  angina.   Assessment & Plan    Unstable angina -Pt with extensive CAD and recent nonSTEMI. By [facility] on 02/20/19 it was difficult to identify the culprit lesion. He has significant disease in his grafts and severe in-stent restenosis in the native left circumflex with new severe stenosis in the mid left main coronary artery. It was ultimately decided to place DES in SVG to R PDA.  -Pt presented for follow up reporting that he had initial improvement in his symptoms, but then has developed recurrent exertional angina- chest burning. He was also having some rest symptoms in the  office.  -EKG demonstrated T wave inversions which appeared to be new.  -Troponins have been negative X3.  -On IV heparin, aspirin, plavix. PRN NTG.  -Will consider repeat cath, may discuss with Dr. Angelena Form.   CAD -History of CABG X5 in 2002 and DES to the LCx in 2009. Recent LHC as above.  -Medical therapy includes aspirin, Plavix and beta blocker. He is intolerant to statins.   Permanent atrial fibrillation -Rate controlled on beta blocker. Rates upper 50's-low 60's.  -Pt historically declines anticoagulation  Hypertension -Currently well controlled.   Hyperlipidemia -Intolerant to statins. Continues on ezetimibe and Fish oil.  -LDL 93.   Acute kidney injury  -SCr with his MI was elevated to 1.42. SCr this admission is 1.25.   Type 2 DM -Recent A1c 7.6. He has declined medical therapy.   Bilateral carotid artery disease, unspecified type ([place]) Follow-up carotid US will be due in January 2021.      For questions or updates, please contact [place] Please consult [email or web address] for contact info under        Signed, Daune Perch, NP  03/08/2019, 9:53 AM

## 2019-03-08 NOTE — Plan of Care (Signed)
  Problem: Pain Managment: Goal: General experience of comfort will improve Outcome: Progressing Note: No s/s of pain or discomfort noted.  Encouraged patient to notify RN if CP/SOB resumes.

## 2019-03-08 NOTE — Progress Notes (Signed)
[place] for heparin Indication: chest pain/ACS  Allergies  Allergen Reactions  . Metformin And Related Diarrhea and Other (See Comments)    flatulence  . Allopurinol Other (See Comments)    Painful skin nodules  . Percocet [Oxycodone-Acetaminophen] Other (See Comments)    Unknown reaction  . Rabeprazole Sodium Other (See Comments)    Unknown reaction  . Statins Other (See Comments)    Hmg-Coa Reductase inhibitors, Myalgias (intolerance)    Patient Measurements: Height: 5\' 9"  (175.3 cm) Weight: 172 lb 4.8 oz (78.2 kg) IBW/kg (Calculated) : 70.7 Heparin Dosing Weight: 80.3kg  Vital Signs: Temp: 97.7 F (36.5 C) (06/20 0557) Temp Source: Oral (06/20 0557) BP: 111/64 (06/20 0557) Pulse Rate: 66 (06/20 0557)  Labs: Recent Labs    03/07/19 1125 03/07/19 1615 03/07/19 2035 03/07/19 2225 03/08/19 0815  HGB 13.5  --   --   --  13.2  HCT 38.9*  --   --   --  38.7*  PLT 126*  --   --   --  PENDING  APTT 35  --   --   --   --   LABPROT 15.6*  --   --   --   --   INR 1.3*  --   --   --   --   HEPARINUNFRC  --   --  0.43  --  0.63  CREATININE 1.25*  --   --   --   --   TROPONINI <0.03 <0.03  --  <0.03  --     Estimated Creatinine Clearance: 41.6 mL/min (A) (by C-G formula based on SCr of 1.25 mg/dL (H)).   Assessment: 68 yom presented to the ED with CP after recent PCI. To start IV heparin. Baseline labs are pending. Platelets have been slightly low in the recent past so will need to monitor. He is not on anticoagulation PTA.  Heparin level therapeutic this AM at 0.63 on 1000 units/hr. Hgb stable, plts ok, no reports of bleeding.   Goal of Therapy:  Heparin level 0.3-0.7 units/ml Monitor platelets by anticoagulation protocol: Yes   Plan:  Continue heparin gtt at 1000 units / hr Daily heparin level and CBC   Juanell Fairly, PharmD PGY1 Pharmacy Resident 03/08/2019,8:56 AM

## 2019-03-09 LAB — GLUCOSE, CAPILLARY
Glucose-Capillary: 133 mg/dL — ABNORMAL HIGH (ref 70–99)
Glucose-Capillary: 192 mg/dL — ABNORMAL HIGH (ref 70–99)
Glucose-Capillary: 122 mg/dL — ABNORMAL HIGH (ref 70–99)
Glucose-Capillary: 178 mg/dL — ABNORMAL HIGH (ref 70–99)

## 2019-03-09 LAB — BASIC METABOLIC PANEL
Anion gap: 11 (ref 5–15)
BUN: 22 mg/dL (ref 8–23)
CO2: 24 mmol/L (ref 22–32)
Calcium: 9.1 mg/dL (ref 8.9–10.3)
Chloride: 102 mmol/L (ref 98–111)
Creatinine, Ser: 1.31 mg/dL — ABNORMAL HIGH (ref 0.61–1.24)
GFR calc Af Amer: 56 mL/min — ABNORMAL LOW (ref >60)
GFR calc non Af Amer: 49 mL/min — ABNORMAL LOW (ref >60)
Glucose, Bld: 150 mg/dL — ABNORMAL HIGH (ref 70–99)
Potassium: 4.1 mmol/L (ref 3.5–5.1)
Sodium: 137 mmol/L (ref 135–145)

## 2019-03-09 LAB — CBC
HCT: 37.9 % — ABNORMAL LOW (ref 39.0–52.0)
Hemoglobin: 13.1 g/dL (ref 13.0–17.0)
MCH: 31 pg (ref 26.0–34.0)
MCHC: 34.6 g/dL (ref 30.0–36.0)
MCV: 89.6 fL (ref 80.0–100.0)
Platelets: 114 10*3/uL — ABNORMAL LOW (ref 150–400)
RBC: 4.23 MIL/uL (ref 4.22–5.81)
RDW: 13 % (ref 11.5–15.5)
WBC: 5.1 10*3/uL (ref 4.0–10.5)
nRBC: 0 % (ref 0.0–0.2)

## 2019-03-09 LAB — HEPARIN LEVEL (UNFRACTIONATED): Heparin Unfractionated: 0.62 IU/mL (ref 0.30–0.70)

## 2019-03-09 MED ORDER — SODIUM CHLORIDE 0.9% FLUSH
3.0000 mL | Freq: Two times a day (BID) | INTRAVENOUS | Status: DC
  Administered 2019-03-09 – 2019-03-10 (×2): 3 mL via INTRAVENOUS

## 2019-03-09 MED ORDER — SODIUM CHLORIDE 0.9 % WEIGHT BASED INFUSION
3.0000 mL/kg/h | INTRAVENOUS | Status: DC
  Administered 2019-03-10: 3 mL/kg/h via INTRAVENOUS

## 2019-03-09 MED ORDER — SODIUM CHLORIDE 0.9% FLUSH: 3.0000 mL | INTRAVENOUS | Status: DC | PRN

## 2019-03-09 MED ORDER — SODIUM CHLORIDE 0.9 % IV SOLN: 250.0000 mL | INTRAVENOUS | Status: DC | PRN

## 2019-03-09 MED ORDER — SODIUM CHLORIDE 0.9 % WEIGHT BASED INFUSION: 1.0000 mL/kg/h | INTRAVENOUS | Status: DC

## 2019-03-09 NOTE — Progress Notes (Signed)
Progress Note  Patient Name: William Weber Date of Encounter: 03/09/2019   Primary Cardiologist: Lauree Chandler, MD   Subjective   No chest pain overnight. Plan for [facility] tomorrow. BMET pending today.  Inpatient Medications    Scheduled Meds: . aspirin EC  81 mg Oral Daily  . clopidogrel  75 mg Oral Q breakfast  . ezetimibe  10 mg Oral Daily  . isosorbide mononitrate  30 mg Oral Daily  . metoprolol succinate  12.5 mg Oral QPM  . omega-3 acid ethyl esters  1 g Oral BID  . pantoprazole  40 mg Oral Daily  . vitamin B-12  100 mcg Oral Daily  . vitamin C with rose hips  1,000 mg Oral Daily   Continuous Infusions: . heparin 1,000 Units/hr (03/09/19 0835)   PRN Meds: acetaminophen, diphenhydrAMINE, nitroGLYCERIN, ondansetron (ZOFRAN) IV   Vital Signs    Vitals:   03/08/19 1126 03/08/19 1744 03/08/19 2053 03/09/19 0606  BP: (!) 143/78 131/79 118/80 118/84  Pulse: 64 89 73 79  Resp: 20  20 20   Temp: 97.9 F (36.6 C)  98.4 F (36.9 C) 97.6 F (36.4 C)  TempSrc: Oral  Oral Oral  SpO2: 99%  98% 97%  Weight:    77.7 kg  Height:        Intake/Output Summary (Last 24 hours) at 03/09/2019 0958 Last data filed at 03/09/2019 0610 Gross per 24 hour  Intake 1093.78 ml  Output 2000 ml  Net -906.22 ml   Last 3 Weights 03/09/2019 03/08/2019 03/07/2019  Weight (lbs) 171 lb 3.2 oz 172 lb 4.8 oz 177 lb 9.6 oz  Weight (kg) 77.656 kg 78.155 kg 80.559 kg      Telemetry    Afib upper 50's-low 60's - Personally Reviewed  ECG    N/A  Physical Exam   GEN: No acute distress.   Neck: No JVD Cardiac: Irregularly irregular rhythm, no murmurs, rubs, or gallops.  Respiratory: Clear to auscultation bilaterally. GI: Soft, nontender, non-distended  MS: No edema; No deformity. Neuro:  Nonfocal  Psych: Normal affect   Labs    Chemistry Recent Labs  Lab 03/07/19 1125  NA 138  K 4.1  CL 105  CO2 21*  GLUCOSE 156*  BUN 22  CREATININE 1.25*  CALCIUM 9.2  PROT 7.0   ALBUMIN 3.8  AST 44*  ALT 41  ALKPHOS 31*  BILITOT 1.1  GFRNONAA 51*  GFRAA 60*  ANIONGAP 12     Hematology Recent Labs  Lab 03/07/19 1125 03/08/19 0815 03/09/19 0852  WBC 5.1 4.4 5.1  RBC 4.31 4.28 4.23  HGB 13.5 13.2 13.1  HCT 38.9* 38.7* 37.9*  MCV 90.3 90.4 89.6  MCH 31.3 30.8 31.0  MCHC 34.7 34.1 34.6  RDW 12.9 13.1 13.0  PLT 126* 113* 114*    Cardiac Enzymes Recent Labs  Lab 03/07/19 1125 03/07/19 1615 03/07/19 2225  TROPONINI <0.03 <0.03 <0.03   No results for input(s): TROPIPOC in the last 168 hours.   BNPNo results for input(s): BNP, PROBNP in the last 168 hours.   DDimer No results for input(s): DDIMER in the last 168 hours.   Radiology    No results found.  Cardiac Studies   CORONARY STENT INTERVENTION  02/20/2019  LEFT HEART CATH AND CORS/GRAFTS ANGIOGRAPHY  Conclusion   Ost Cx to Prox Cx lesion is 95% stenosed.  Mid LM lesion is 95% stenosed.  Ost 1st Mrg lesion is 80% stenosed.  Prox LAD lesion is 100%  stenosed.  Mid RCA lesion is 70% stenosed.  Dist RCA lesion is 50% stenosed.  Post Atrio lesion is 70% stenosed.  SVG.  1st Diag lesion is 95% stenosed.  Origin to Prox Graft lesion is 40% stenosed.  Origin to Prox Graft lesion is 90% stenosed.  Mid Graft to Dist Graft lesion is 85% stenosed.  Dist Graft lesion is 95% stenosed.  Post intervention, there is a 0% residual stenosis.  A drug-eluting stent was successfully placed using a STENT RESOLUTE ONYX 3.0X15.  The left ventricular systolic function is normal.  LV end diastolic pressure is normal.  The left ventricular ejection fraction is 55-65% by visual estimate.  LIMA and is normal in caliber.   1.  Significant underlying three-vessel coronary artery disease with patent LIMA to LAD.  Severe disease affecting SVG to OM, SVG to diagonal and SVG to right PDA.  In addition, there is severe in-stent restenosis in the native left circumflex with new severe stenosis in  the mid left main coronary artery. 2.  Normal LV systolic function and left ventricular end-diastolic pressure. 3.  Successful angioplasty and drug-eluting stent placement to the SVG to right PDA  Recommendations: This was a difficult situation as it was difficult to know the culprit for non-ST elevation myocardial infarction.  I discussed the case with Dr. Angelena Form and I decided to intervene on the SVG graft with the largest territory and least procedure risk.  SVG to OM is diffusely diseased and even with successful PCI, I do not think it will stay open long-term.  PCI of SVG to diagonal is possible although the disease is at the anastomosis with extension into the proximal portion of the diagonal branch.  There is significant vessel mismatch and the native diagonal is likely 2.25 mm in diameter.  PCI of the left main and left circumflex is also challenging given the 90 degree takeoff of the left circumflex from the left main with calcifications and previous stent in the proximal left circumflex.   Echocardiogram 02/19/2019 IMPRESSIONS   1. The left ventricle has normal systolic function with an ejection fraction of 60-65%. The cavity size was normal. Mild basal septal hypertrophy. Left ventricular diastolic function could not be evaluated secondary to atrial fibrillation. No evidence  of left ventricular regional wall motion abnormalities.  2. The right ventricle has normal systolic function. The cavity was normal. There is no increase in right ventricular wall thickness.  3. Left atrial size was mildly dilated.  4. There is moderate mitral annular calcification present.  5. The inferior vena cava was dilated in size with >50% respiratory variability.    Patient Profile     85 y.o. male with hx of CAD s/p CABG 2002, DES to [facility] 2009, NSTEMI 02/2019, HTN, HLD, permanent atrial fibrillation-refuses OAC, DM and COPD. Admitted for worsening exertional angina.   Assessment & Plan    Unstable  angina -Pt with extensive CAD and recent nonSTEMI. By [facility] on 02/20/19 it was difficult to identify the culprit lesion. He has significant disease in his grafts and severe in-stent restenosis in the native left circumflex with new severe stenosis in the mid left main coronary artery. It was ultimately decided to place DES in SVG to R PDA.  -Pt presented for follow up reporting that he had initial improvement in his symptoms, but then has developed recurrent exertional angina- chest burning. He was also having some rest symptoms in the office.  -EKG demonstrated T wave inversions which appeared to be  new.  -Troponins have been negative X3.  -On IV heparin, aspirin, plavix. PRN NTG.  -Started imdur 30 mg daily yesterday - tolerating well.  CAD -History of CABG X5 in 2002 and DES to the LCx in 2009. Recent LHC as above.  -Medical therapy includes aspirin, Plavix and beta blocker. He is intolerant to statins.   -Plan repeat LHC in am Monday.  Permanent atrial fibrillation -Rate controlled on beta blocker. Rates upper 50's-low 60's.  -Pt historically declines anticoagulation  Hypertension -Currently well controlled.   Hyperlipidemia -Intolerant to statins. Continues on ezetimibe and Fish oil.  -LDL 93.   Acute kidney injury  -SCr with his MI was elevated to 1.42. SCr this admission is 1.25.   Type 2 DM -Recent A1c 7.6. He has declined medical therapy.   Bilateral carotid artery disease, unspecified type ([place]) Follow-up carotid US will be due in January 2021.      For questions or updates, please contact [place] Please consult [email or web address] for contact info under   Pixie Casino, MD, FACC, [place] Director of the [facility] Diplomate of the American Board of Clinical Lipidology Attending Cardiologist  Direct Dial: [phone]  Fax: [phone]  Website:  [email or web address]   Pixie Casino, MD  03/09/2019, 9:58 AM

## 2019-03-09 NOTE — Progress Notes (Signed)
[place] for heparin Indication: chest pain/ACS  Allergies  Allergen Reactions  . Metformin And Related Diarrhea and Other (See Comments)    flatulence  . Allopurinol Other (See Comments)    Painful skin nodules  . Percocet [Oxycodone-Acetaminophen] Other (See Comments)    Unknown reaction  . Rabeprazole Sodium Other (See Comments)    Unknown reaction  . Statins Other (See Comments)    Hmg-Coa Reductase inhibitors, Myalgias (intolerance)    Patient Measurements: Height: 5\' 9"  (175.3 cm) Weight: 171 lb 3.2 oz (77.7 kg) IBW/kg (Calculated) : 70.7 Heparin Dosing Weight: 80.3kg  Vital Signs: Temp: 97.6 F (36.4 C) (06/21 0606) Temp Source: Oral (06/21 0606) BP: 118/84 (06/21 0606) Pulse Rate: 79 (06/21 0606)  Labs: Recent Labs    03/07/19 1125 03/07/19 1615 03/07/19 2035 03/07/19 2225 03/08/19 0815 03/09/19 0852  HGB 13.5  --   --   --  13.2 13.1  HCT 38.9*  --   --   --  38.7* 37.9*  PLT 126*  --   --   --  113* 114*  APTT 35  --   --   --   --   --   LABPROT 15.6*  --   --   --   --   --   INR 1.3*  --   --   --   --   --   HEPARINUNFRC  --   --  0.43  --  0.63 0.62  CREATININE 1.25*  --   --   --   --  1.31*  TROPONINI <0.03 <0.03  --  <0.03  --   --     Estimated Creatinine Clearance: 39.7 mL/min (A) (by C-G formula based on SCr of 1.31 mg/dL (H)).   Assessment: 66 yom presented to the ED with CP after recent PCI. To start IV heparin. Baseline labs are pending. Platelets have been slightly low in the recent past so will need to monitor. He is not on anticoagulation PTA.  Heparin level reamins therapeutic this AM at 0.62 on 1000 units/hr. Hgb stable, plts ok, no reports of bleeding. Repeat LHC planned for Monday.  Goal of Therapy:  Heparin level 0.3-0.7 units/ml Monitor platelets by anticoagulation protocol: Yes   Plan:  Continue heparin gtt at 1000 units / hr Daily heparin level and CBC   Juanell Fairly,  PharmD PGY1 Pharmacy Resident 03/09/2019,10:20 AM

## 2019-03-10 ENCOUNTER — Encounter (HOSPITAL_COMMUNITY): Payer: Self-pay | Admitting: Cardiovascular Disease

## 2019-03-10 ENCOUNTER — Encounter (HOSPITAL_COMMUNITY)
Admission: AD | Disposition: A | Discharge status: Home / Self Care | Payer: Self-pay | Source: Ambulatory Visit | Attending: Cardiology

## 2019-03-10 DIAGNOSIS — I25710 Atherosclerosis of autologous vein coronary artery bypass graft(s) with unstable angina pectoris: Secondary | ICD-10-CM

## 2019-03-10 HISTORY — PX: CORONARY STENT INTERVENTION: CATH118234

## 2019-03-10 HISTORY — PX: LEFT HEART CATH AND CORS/GRAFTS ANGIOGRAPHY: CATH118250

## 2019-03-10 LAB — BASIC METABOLIC PANEL
Anion gap: 9 (ref 5–15)
BUN: 22 mg/dL (ref 8–23)
CO2: 23 mmol/L (ref 22–32)
Calcium: 8.9 mg/dL (ref 8.9–10.3)
Chloride: 105 mmol/L (ref 98–111)
Creatinine, Ser: 1.34 mg/dL — ABNORMAL HIGH (ref 0.61–1.24)
GFR calc Af Amer: 55 mL/min — ABNORMAL LOW (ref >60)
GFR calc non Af Amer: 47 mL/min — ABNORMAL LOW (ref >60)
Glucose, Bld: 130 mg/dL — ABNORMAL HIGH (ref 70–99)
Potassium: 3.8 mmol/L (ref 3.5–5.1)
Sodium: 137 mmol/L (ref 135–145)

## 2019-03-10 LAB — GLUCOSE, CAPILLARY
Glucose-Capillary: 131 mg/dL — ABNORMAL HIGH (ref 70–99)
Glucose-Capillary: 155 mg/dL — ABNORMAL HIGH (ref 70–99)
Glucose-Capillary: 168 mg/dL — ABNORMAL HIGH (ref 70–99)

## 2019-03-10 LAB — CBC
HCT: 36.8 % — ABNORMAL LOW (ref 39.0–52.0)
Hemoglobin: 12.5 g/dL — ABNORMAL LOW (ref 13.0–17.0)
MCH: 30.3 pg (ref 26.0–34.0)
MCHC: 34 g/dL (ref 30.0–36.0)
MCV: 89.3 fL (ref 80.0–100.0)
Platelets: 110 10*3/uL — ABNORMAL LOW (ref 150–400)
RBC: 4.12 MIL/uL — ABNORMAL LOW (ref 4.22–5.81)
RDW: 12.8 % (ref 11.5–15.5)
WBC: 4.6 10*3/uL (ref 4.0–10.5)
nRBC: 0 % (ref 0.0–0.2)

## 2019-03-10 LAB — HEPARIN LEVEL (UNFRACTIONATED): Heparin Unfractionated: 0.45 IU/mL (ref 0.30–0.70)

## 2019-03-10 LAB — POCT ACTIVATED CLOTTING TIME
Activated Clotting Time: 340 seconds
Activated Clotting Time: 610 seconds

## 2019-03-10 SURGERY — LEFT HEART CATH AND CORS/GRAFTS ANGIOGRAPHY
Anesthesia: LOCAL

## 2019-03-10 MED ORDER — MIDAZOLAM HCL 2 MG/2ML IJ SOLN
INTRAMUSCULAR | Status: AC
  Filled 2019-03-10: qty 2

## 2019-03-10 MED ORDER — IOHEXOL 350 MG/ML SOLN
INTRAVENOUS | Status: DC | PRN
  Administered 2019-03-10: 195 mL via INTRACARDIAC

## 2019-03-10 MED ORDER — VERAPAMIL HCL 2.5 MG/ML IV SOLN
INTRA_ARTERIAL | Status: DC | PRN
  Administered 2019-03-10: 11:00:00 10 mL via INTRA_ARTERIAL

## 2019-03-10 MED ORDER — FENTANYL CITRATE (PF) 100 MCG/2ML IJ SOLN
INTRAMUSCULAR | Status: DC | PRN
  Administered 2019-03-10: 25 ug via INTRAVENOUS

## 2019-03-10 MED ORDER — FENTANYL CITRATE (PF) 100 MCG/2ML IJ SOLN
INTRAMUSCULAR | Status: AC
  Filled 2019-03-10: qty 2

## 2019-03-10 MED ORDER — SODIUM CHLORIDE 0.9 % IV SOLN
INTRAVENOUS | Status: AC
  Administered 2019-03-10: 13:00:00 via INTRAVENOUS

## 2019-03-10 MED ORDER — CLOPIDOGREL BISULFATE 300 MG PO TABS
ORAL_TABLET | ORAL | Status: AC
  Filled 2019-03-10: qty 1

## 2019-03-10 MED ORDER — NITROGLYCERIN 1 MG/10 ML FOR IR/CATH LAB
INTRA_ARTERIAL | Status: AC
  Filled 2019-03-10: qty 10

## 2019-03-10 MED ORDER — HEPARIN (PORCINE) IN NACL 1000-0.9 UT/500ML-% IV SOLN
INTRAVENOUS | Status: DC | PRN
  Administered 2019-03-10 (×2): 500 mL

## 2019-03-10 MED ORDER — HEPARIN SODIUM (PORCINE) 1000 UNIT/ML IJ SOLN
INTRAMUSCULAR | Status: AC
  Filled 2019-03-10: qty 1

## 2019-03-10 MED ORDER — SODIUM CHLORIDE 0.9% FLUSH
3.0000 mL | Freq: Two times a day (BID) | INTRAVENOUS | Status: DC
  Administered 2019-03-10: 3 mL via INTRAVENOUS

## 2019-03-10 MED ORDER — VERAPAMIL HCL 2.5 MG/ML IV SOLN
INTRAVENOUS | Status: AC
  Filled 2019-03-10: qty 2

## 2019-03-10 MED ORDER — SODIUM CHLORIDE 0.9% FLUSH: 3.0000 mL | INTRAVENOUS | Status: DC | PRN

## 2019-03-10 MED ORDER — HEPARIN (PORCINE) IN NACL 1000-0.9 UT/500ML-% IV SOLN
INTRAVENOUS | Status: AC
  Filled 2019-03-10: qty 1000

## 2019-03-10 MED ORDER — LIDOCAINE HCL (PF) 1 % IJ SOLN
INTRAMUSCULAR | Status: AC
  Filled 2019-03-10: qty 30

## 2019-03-10 MED ORDER — LABETALOL HCL 5 MG/ML IV SOLN: 10.0000 mg | INTRAVENOUS | Status: AC | PRN

## 2019-03-10 MED ORDER — LIDOCAINE HCL (PF) 1 % IJ SOLN
INTRAMUSCULAR | Status: DC | PRN
  Administered 2019-03-10: 2 mL via INTRADERMAL

## 2019-03-10 MED ORDER — SODIUM CHLORIDE 0.9 % IV SOLN: 250.0000 mL | INTRAVENOUS | Status: DC | PRN

## 2019-03-10 MED ORDER — HYDRALAZINE HCL 20 MG/ML IJ SOLN: 10.0000 mg | INTRAMUSCULAR | Status: AC | PRN

## 2019-03-10 MED ORDER — MIDAZOLAM HCL 2 MG/2ML IJ SOLN
INTRAMUSCULAR | Status: DC | PRN
  Administered 2019-03-10: 1 mg via INTRAVENOUS

## 2019-03-10 MED ORDER — HEPARIN SODIUM (PORCINE) 1000 UNIT/ML IJ SOLN
INTRAMUSCULAR | Status: DC | PRN
  Administered 2019-03-10: 4000 [IU] via INTRAVENOUS
  Administered 2019-03-10: 6000 [IU] via INTRAVENOUS

## 2019-03-10 SURGICAL SUPPLY — 23 items
BAG SNAP BAND KOVER 36X36 (MISCELLANEOUS) ×1 IMPLANT
BALLN SAPPHIRE 2.0X12 (BALLOONS) ×4
BALLN SAPPHIRE ~~LOC~~ 2.5X8 (BALLOONS) ×1 IMPLANT
BALLOON SAPPHIRE 2.0X12 (BALLOONS) IMPLANT
CATH 5FR JL3.5 JR4 ANG PIG MP (CATHETERS) ×1 IMPLANT
CATH LAUNCHER 6FR EBU3.5 (CATHETERS) IMPLANT
CATHETER LAUNCHER 6FR LCB (CATHETERS) ×1 IMPLANT
COVER DOME SNAP 22 D (MISCELLANEOUS) ×1 IMPLANT
DEVICE RAD COMP TR BAND LRG (VASCULAR PRODUCTS) ×1 IMPLANT
DEVICE SPIDERFX EMB PROT 4MM (WIRE) ×1 IMPLANT
GLIDESHEATH SLEND SS 6F .021 (SHEATH) ×1 IMPLANT
GUIDEWIRE INQWIRE 1.5J.035X260 (WIRE) IMPLANT
INQWIRE 1.5J .035X260CM (WIRE) ×2
KIT ENCORE 26 ADVANTAGE (KITS) ×1 IMPLANT
KIT HEART LEFT (KITS) ×2 IMPLANT
PACK CARDIAC CATHETERIZATION (CUSTOM PROCEDURE TRAY) ×2 IMPLANT
STENT RESOLUTE ONYX 2.25X18 (Permanent Stent) ×1 IMPLANT
STENT RESOLUTE ONYX 3.0X12 (Permanent Stent) ×1 IMPLANT
STENT RESOLUTE ONYX3.0X38 (Permanent Stent) ×1 IMPLANT
TRANSDUCER W/STOPCOCK (MISCELLANEOUS) ×2 IMPLANT
TUBING CIL FLEX 10 FLL-RA (TUBING) ×2 IMPLANT
WIRE COUGAR XT STRL 190CM (WIRE) ×1 IMPLANT
WIRE HI TORQ VERSACORE-J 145CM (WIRE) ×1 IMPLANT

## 2019-03-10 NOTE — Progress Notes (Addendum)
Progress Note  Patient Name: William Weber Date of Encounter: 03/10/2019  Primary Cardiologist: Lauree Chandler, MD   Subjective   No chest pain overnight.   Inpatient Medications    Scheduled Meds:  aspirin EC  81 mg Oral Daily   clopidogrel  75 mg Oral Q breakfast   ezetimibe  10 mg Oral Daily   isosorbide mononitrate  30 mg Oral Daily   metoprolol succinate  12.5 mg Oral QPM   omega-3 acid ethyl esters  1 g Oral BID   pantoprazole  40 mg Oral Daily   sodium chloride flush  3 mL Intravenous Q12H   vitamin B-12  100 mcg Oral Daily   vitamin C with rose hips  1,000 mg Oral Daily   Continuous Infusions:  sodium chloride     sodium chloride 1 mL/kg/hr (03/10/19 0624)   heparin 1,000 Units/hr (03/09/19 1500)   PRN Meds: sodium chloride, acetaminophen, diphenhydrAMINE, nitroGLYCERIN, ondansetron (ZOFRAN) IV, sodium chloride flush   Vital Signs    Vitals:   03/09/19 1220 03/09/19 1653 03/09/19 2121 03/10/19 0608  BP: 105/80 124/77 131/60 119/74  Pulse: 76 93 70 70  Resp: 18  18 18   Temp: 97.9 F (36.6 C)  98 F (36.7 C) 97.9 F (36.6 C)  TempSrc: Oral  Oral Oral  SpO2: 98%  100% 97%  Weight:    77.2 kg  Height:        Intake/Output Summary (Last 24 hours) at 03/10/2019 0833 Last data filed at 03/10/2019 0554 Gross per 24 hour  Intake 1187.37 ml  Output 2200 ml  Net -1012.63 ml   Last 3 Weights 03/10/2019 03/09/2019 03/08/2019  Weight (lbs) 170 lb 1.6 oz 171 lb 3.2 oz 172 lb 4.8 oz  Weight (kg) 77.157 kg 77.656 kg 78.155 kg      Telemetry    Afib rate controlled - Personally Reviewed  ECG     N/a - Personally Reviewed  Physical Exam   GEN: No acute distress.   Neck: No JVD Cardiac: Irreg Irreg, no murmurs, rubs, or gallops.  Respiratory: Clear to auscultation bilaterally. GI: Soft, nontender, non-distended  MS: No edema; No deformity. Neuro:  Nonfocal  Psych: Normal affect   Labs    Chemistry Recent Labs  Lab  03/07/19 1125 03/09/19 0852 03/10/19 0444  NA 138 137 137  K 4.1 4.1 3.8  CL 105 102 105  CO2 21* 24 23  GLUCOSE 156* 150* 130*  BUN 22 22 22   CREATININE 1.25* 1.31* 1.34*  CALCIUM 9.2 9.1 8.9  PROT 7.0  --   --   ALBUMIN 3.8  --   --   AST 44*  --   --   ALT 41  --   --   ALKPHOS 31*  --   --   BILITOT 1.1  --   --   GFRNONAA 51* 49* 47*  GFRAA 60* 56* 55*  ANIONGAP 12 11 9      Hematology Recent Labs  Lab 03/08/19 0815 03/09/19 0852 03/10/19 0444  WBC 4.4 5.1 4.6  RBC 4.28 4.23 4.12*  HGB 13.2 13.1 12.5*  HCT 38.7* 37.9* 36.8*  MCV 90.4 89.6 89.3  MCH 30.8 31.0 30.3  MCHC 34.1 34.6 34.0  RDW 13.1 13.0 12.8  PLT 113* 114* 110*    Cardiac Enzymes Recent Labs  Lab 03/07/19 1125 03/07/19 1615 03/07/19 2225  TROPONINI <0.03 <0.03 <0.03   No results for input(s): TROPIPOC in the last 168 hours.  BNPNo results for input(s): BNP, PROBNP in the last 168 hours.   DDimer No results for input(s): DDIMER in the last 168 hours.   Radiology    No results found.  Cardiac Studies   CORONARY STENT INTERVENTION  02/20/2019  LEFT HEART CATH AND CORS/GRAFTS ANGIOGRAPHY  Conclusion   Ost Cx to Prox Cx lesion is 95% stenosed.  Mid LM lesion is 95% stenosed.  Ost 1st Mrg lesion is 80% stenosed.  Prox LAD lesion is 100% stenosed.  Mid RCA lesion is 70% stenosed.  Dist RCA lesion is 50% stenosed.  Post Atrio lesion is 70% stenosed.  SVG.  1st Diag lesion is 95% stenosed.  Origin to Prox Graft lesion is 40% stenosed.  Origin to Prox Graft lesion is 90% stenosed.  Mid Graft to Dist Graft lesion is 85% stenosed.  Dist Graft lesion is 95% stenosed.  Post intervention, there is a 0% residual stenosis.  A drug-eluting stent was successfully placed using a STENT RESOLUTE ONYX 3.0X15.  The left ventricular systolic function is normal.  LV end diastolic pressure is normal.  The left ventricular ejection fraction is 55-65% by visual estimate.  LIMA and  is normal in caliber.  1. Significant underlying three-vessel coronary artery disease with patent LIMA to LAD. Severe disease affecting SVG to OM, SVG to diagonal and SVG to right PDA. In addition, there is severe in-stent restenosis in the native left circumflex with new severe stenosis in the mid left main coronary artery. 2. Normal LV systolic function and left ventricular end-diastolic pressure. 3. Successful angioplasty and drug-eluting stent placement to the SVG to right PDA  Recommendations: This was a difficult situation as it was difficult to know the culprit for non-ST elevation myocardial infarction. I discussed the case with Dr. Angelena Form and I decided to intervene on the SVG graft with the largest territory and least procedure risk. SVG to OM is diffusely diseased and even with successful PCI, I do not think it will stay open long-term. PCI of SVG to diagonal is possible although the disease is at the anastomosis with extension into the proximal portion of the diagonal branch. There is significant vessel mismatch and the native diagonal is likely 2.25 mm in diameter. PCI of the left main and left circumflex is also challenging given the 90 degree takeoff of the left circumflex from the left main with calcifications and previous stent in the proximal left circumflex.    Diagnostic Dominance: Right  Intervention     Patient Profile     85 y.o. male with hx of CAD s/p CABG 2002, DES to [facility] 2009, NSTEMI 02/2019, HTN, HLD, permanent atrial fibrillation-refuses OAC, DM and COPD. Admitted for worsening exertional angina.    Assessment & Plan    1. Unstable angina: Pt with extensive CAD and recent nonSTEMI. By [facility] on 02/20/19 it was difficult to identify the culprit lesion. He had significant disease in his grafts and severe in-stent restenosis in the native left circumflex with new severe stenosis in the mid left main coronary artery. It was ultimately decided to place DES in  SVG to R PDA. Seen back in the office with recurrent chest pain and sent for admission.  -EKG demonstrated T wave inversions which was new  -Troponins have been negative X3.  -On IV heparin, aspirin, plavix and Imdur. Planned for cardiac cath today.   2. CAD: History of CABG X5 in 2002 and DES to the LCx in 2009. Recent LHC as above.  -Medical therapy  includes aspirin, Plavix and beta blocker. He is intolerant to statins.   3. Permanent atrial fibrillation -Rate controlled on beta blocker. Rates upper 50's-low 60's.  -Pt historically declines anticoagulation  4. Hypertension -Currently well controlled.   5. Hyperlipidemia -Intolerant to statins. Continues on ezetimibe and Fish oil.  -LDL 93.   6. Acute kidney injury: 1.31>>1.34 -Baseline around 1.25  7. Type 2 DM -Recent A1c 7.6. He has declined medical therapy.   8. Bilateral carotid artery disease, unspecified type ([place]) -Follow-up carotid US will be due in January 2021.  For questions or updates, please contact [place] Please consult [email or web address] for contact info under    Signed, Reino Bellis, NP  03/10/2019, 8:33 AM     Agree with note by Reino Bellis NP-C  Patient admitted with progressive exertional chest pain.  His enzymes are negative.  He had recent RCA SVG PCI and drug-eluting stenting by Dr. Fletcher Anon on 02/19/2019 with residual disease in the vein graft to a diagonal branch, obtuse marginal branch as well as high-grade calcified left main and proximal left circumflex in-stent restenosis, none of which presented good interventional options.  His EF was normal.  He is scheduled for repeat diagnostic coronary arteriogram today by Dr. Angelena Form, his primary cardiologist.  Lorretta Harp, [name], [place], [facility], [place], [place] [address] [place], [place]  [address]  [phone] 03/10/2019 9:15 AM

## 2019-03-10 NOTE — H&P (View-Only) (Signed)
Progress Note  Patient Name: William Weber Date of Encounter: 03/10/2019  Primary Cardiologist: Lauree Chandler, MD   Subjective   No chest pain overnight.   Inpatient Medications    Scheduled Meds:  aspirin EC  81 mg Oral Daily   clopidogrel  75 mg Oral Q breakfast   ezetimibe  10 mg Oral Daily   isosorbide mononitrate  30 mg Oral Daily   metoprolol succinate  12.5 mg Oral QPM   omega-3 acid ethyl esters  1 g Oral BID   pantoprazole  40 mg Oral Daily   sodium chloride flush  3 mL Intravenous Q12H   vitamin B-12  100 mcg Oral Daily   vitamin C with rose hips  1,000 mg Oral Daily   Continuous Infusions:  sodium chloride     sodium chloride 1 mL/kg/hr (03/10/19 0624)   heparin 1,000 Units/hr (03/09/19 1500)   PRN Meds: sodium chloride, acetaminophen, diphenhydrAMINE, nitroGLYCERIN, ondansetron (ZOFRAN) IV, sodium chloride flush   Vital Signs    Vitals:   03/09/19 1220 03/09/19 1653 03/09/19 2121 03/10/19 0608  BP: 105/80 124/77 131/60 119/74  Pulse: 76 93 70 70  Resp: 18  18 18   Temp: 97.9 F (36.6 C)  98 F (36.7 C) 97.9 F (36.6 C)  TempSrc: Oral  Oral Oral  SpO2: 98%  100% 97%  Weight:    77.2 kg  Height:        Intake/Output Summary (Last 24 hours) at 03/10/2019 0833 Last data filed at 03/10/2019 0554 Gross per 24 hour  Intake 1187.37 ml  Output 2200 ml  Net -1012.63 ml   Last 3 Weights 03/10/2019 03/09/2019 03/08/2019  Weight (lbs) 170 lb 1.6 oz 171 lb 3.2 oz 172 lb 4.8 oz  Weight (kg) 77.157 kg 77.656 kg 78.155 kg      Telemetry    Afib rate controlled - Personally Reviewed  ECG     N/a - Personally Reviewed  Physical Exam   GEN: No acute distress.   Neck: No JVD Cardiac: Irreg Irreg, no murmurs, rubs, or gallops.  Respiratory: Clear to auscultation bilaterally. GI: Soft, nontender, non-distended  MS: No edema; No deformity. Neuro:  Nonfocal  Psych: Normal affect   Labs    Chemistry Recent Labs  Lab  03/07/19 1125 03/09/19 0852 03/10/19 0444  NA 138 137 137  K 4.1 4.1 3.8  CL 105 102 105  CO2 21* 24 23  GLUCOSE 156* 150* 130*  BUN 22 22 22   CREATININE 1.25* 1.31* 1.34*  CALCIUM 9.2 9.1 8.9  PROT 7.0  --   --   ALBUMIN 3.8  --   --   AST 44*  --   --   ALT 41  --   --   ALKPHOS 31*  --   --   BILITOT 1.1  --   --   GFRNONAA 51* 49* 47*  GFRAA 60* 56* 55*  ANIONGAP 12 11 9      Hematology Recent Labs  Lab 03/08/19 0815 03/09/19 0852 03/10/19 0444  WBC 4.4 5.1 4.6  RBC 4.28 4.23 4.12*  HGB 13.2 13.1 12.5*  HCT 38.7* 37.9* 36.8*  MCV 90.4 89.6 89.3  MCH 30.8 31.0 30.3  MCHC 34.1 34.6 34.0  RDW 13.1 13.0 12.8  PLT 113* 114* 110*    Cardiac Enzymes Recent Labs  Lab 03/07/19 1125 03/07/19 1615 03/07/19 2225  TROPONINI <0.03 <0.03 <0.03   No results for input(s): TROPIPOC in the last 168 hours.  BNPNo results for input(s): BNP, PROBNP in the last 168 hours.   DDimer No results for input(s): DDIMER in the last 168 hours.   Radiology    No results found.  Cardiac Studies   CORONARY STENT INTERVENTION  02/20/2019  LEFT HEART CATH AND CORS/GRAFTS ANGIOGRAPHY  Conclusion   Ost Cx to Prox Cx lesion is 95% stenosed.  Mid LM lesion is 95% stenosed.  Ost 1st Mrg lesion is 80% stenosed.  Prox LAD lesion is 100% stenosed.  Mid RCA lesion is 70% stenosed.  Dist RCA lesion is 50% stenosed.  Post Atrio lesion is 70% stenosed.  SVG.  1st Diag lesion is 95% stenosed.  Origin to Prox Graft lesion is 40% stenosed.  Origin to Prox Graft lesion is 90% stenosed.  Mid Graft to Dist Graft lesion is 85% stenosed.  Dist Graft lesion is 95% stenosed.  Post intervention, there is a 0% residual stenosis.  A drug-eluting stent was successfully placed using a STENT RESOLUTE ONYX 3.0X15.  The left ventricular systolic function is normal.  LV end diastolic pressure is normal.  The left ventricular ejection fraction is 55-65% by visual estimate.  LIMA and  is normal in caliber.  1. Significant underlying three-vessel coronary artery disease with patent LIMA to LAD. Severe disease affecting SVG to OM, SVG to diagonal and SVG to right PDA. In addition, there is severe in-stent restenosis in the native left circumflex with new severe stenosis in the mid left main coronary artery. 2. Normal LV systolic function and left ventricular end-diastolic pressure. 3. Successful angioplasty and drug-eluting stent placement to the SVG to right PDA  Recommendations: This was a difficult situation as it was difficult to know the culprit for non-ST elevation myocardial infarction. I discussed the case with Dr. Angelena Form and I decided to intervene on the SVG graft with the largest territory and least procedure risk. SVG to OM is diffusely diseased and even with successful PCI, I do not think it will stay open long-term. PCI of SVG to diagonal is possible although the disease is at the anastomosis with extension into the proximal portion of the diagonal branch. There is significant vessel mismatch and the native diagonal is likely 2.25 mm in diameter. PCI of the left main and left circumflex is also challenging given the 90 degree takeoff of the left circumflex from the left main with calcifications and previous stent in the proximal left circumflex.    Diagnostic Dominance: Right  Intervention     Patient Profile     85 y.o. male with hx of CAD s/p CABG 2002, DES to [facility] 2009, NSTEMI 02/2019, HTN, HLD, permanent atrial fibrillation-refuses OAC, DM and COPD. Admitted for worsening exertional angina.    Assessment & Plan    1. Unstable angina: Pt with extensive CAD and recent nonSTEMI. By [facility] on 02/20/19 it was difficult to identify the culprit lesion. He had significant disease in his grafts and severe in-stent restenosis in the native left circumflex with new severe stenosis in the mid left main coronary artery. It was ultimately decided to place DES in  SVG to R PDA. Seen back in the office with recurrent chest pain and sent for admission.  -EKG demonstrated T wave inversions which was new  -Troponins have been negative X3.  -On IV heparin, aspirin, plavix and Imdur. Planned for cardiac cath today.   2. CAD: History of CABG X5 in 2002 and DES to the LCx in 2009. Recent LHC as above.  -Medical therapy  includes aspirin, Plavix and beta blocker. He is intolerant to statins.   3. Permanent atrial fibrillation -Rate controlled on beta blocker. Rates upper 50's-low 60's.  -Pt historically declines anticoagulation  4. Hypertension -Currently well controlled.   5. Hyperlipidemia -Intolerant to statins. Continues on ezetimibe and Fish oil.  -LDL 93.   6. Acute kidney injury: 1.31>>1.34 -Baseline around 1.25  7. Type 2 DM -Recent A1c 7.6. He has declined medical therapy.   8. Bilateral carotid artery disease, unspecified type ([place]) -Follow-up carotid US will be due in January 2021.  For questions or updates, please contact [place] Please consult [email or web address] for contact info under    Signed, Reino Bellis, NP  03/10/2019, 8:33 AM     Agree with note by Reino Bellis NP-C  Patient admitted with progressive exertional chest pain.  His enzymes are negative.  He had recent RCA SVG PCI and drug-eluting stenting by Dr. Fletcher Anon on 02/19/2019 with residual disease in the vein graft to a diagonal branch, obtuse marginal branch as well as high-grade calcified left main and proximal left circumflex in-stent restenosis, none of which presented good interventional options.  His EF was normal.  He is scheduled for repeat diagnostic coronary arteriogram today by Dr. Angelena Form, his primary cardiologist.  Lorretta Harp, [name], [place], [facility], [place], [place] [address] [place],   [address]  [phone] 03/10/2019 9:15 AM

## 2019-03-10 NOTE — Progress Notes (Signed)
[place] for heparin Indication: chest pain/ACS  Allergies  Allergen Reactions  . Metformin And Related Diarrhea and Other (See Comments)    flatulence  . Allopurinol Other (See Comments)    Painful skin nodules  . Percocet [Oxycodone-Acetaminophen] Other (See Comments)    Unknown reaction  . Rabeprazole Sodium Other (See Comments)    Unknown reaction  . Statins Other (See Comments)    Hmg-Coa Reductase inhibitors, Myalgias (intolerance)    Patient Measurements: Height: 5\' 9"  (175.3 cm) Weight: 170 lb 1.6 oz (77.2 kg) IBW/kg (Calculated) : 70.7 Heparin Dosing Weight: 80.3kg  Vital Signs: Temp: 97.9 F (36.6 C) (06/22 0608) Temp Source: Oral (06/22 0608) BP: 119/74 (06/22 7628) Pulse Rate: 70 (06/22 0608)  Labs: Recent Labs    03/07/19 1125 03/07/19 1615  03/07/19 2225 03/08/19 0815 03/09/19 0852 03/10/19 0444  HGB 13.5  --   --   --  13.2 13.1 12.5*  HCT 38.9*  --   --   --  38.7* 37.9* 36.8*  PLT 126*  --   --   --  113* 114* 110*  APTT 35  --   --   --   --   --   --   LABPROT 15.6*  --   --   --   --   --   --   INR 1.3*  --   --   --   --   --   --   HEPARINUNFRC  --   --    < >  --  0.63 0.62 0.45  CREATININE 1.25*  --   --   --   --  1.31* 1.34*  TROPONINI <0.03 <0.03  --  <0.03  --   --   --    < > = values in this interval not displayed.    Estimated Creatinine Clearance: 38.8 mL/min (A) (by C-G formula based on SCr of 1.34 mg/dL (H)).   Assessment: 25 yom presented to the ED with CP after recent PCI. To start IV heparin. Baseline labs are pending. Platelets have been slightly low in the recent past so will need to monitor. He is not on anticoagulation PTA.  Heparin level remains therapeutic, no bleeding reported  Goal of Therapy:  Heparin level 0.3-0.7 units/ml Monitor platelets by anticoagulation protocol: Yes   Plan:  Continue heparin gtt at 1000 units/hr Daily heparin level, CBC, s/s bleeding F/u  post cath  Bertis Ruddy, PharmD Clinical Pharmacist Please check AMION for all [place] numbers 03/10/2019 8:11 AM

## 2019-03-10 NOTE — Interval H&P Note (Signed)
History and Physical Interval Note:  03/10/2019 10:37 AM  William Weber  has presented today for cardiac cath with the diagnosis of unstable angina.  The various methods of treatment have been discussed with the patient and family. After consideration of risks, benefits and other options for treatment, the patient has consented to  Procedure(s): LEFT HEART CATH AND CORS/GRAFTS ANGIOGRAPHY (N/A) as a surgical intervention.  The patient's history has been reviewed, patient examined, no change in status, stable for surgery.  I have reviewed the patient's chart and labs.  Questions were answered to the patient's satisfaction.    Cath Lab Visit (complete for each Cath Lab visit)  Clinical Evaluation Leading to the Procedure:   ACS: No.  Non-ACS:    Anginal Classification: CCS III  Anti-ischemic medical therapy: Minimal Therapy (1 class of medications)  Non-Invasive Test Results: No non-invasive testing performed  Prior CABG: Previous CABG         Lauree Chandler

## 2019-03-11 ENCOUNTER — Encounter (HOSPITAL_COMMUNITY): Payer: Self-pay | Admitting: Cardiology

## 2019-03-11 DIAGNOSIS — I10 Essential (primary) hypertension: Secondary | ICD-10-CM

## 2019-03-11 DIAGNOSIS — E785 Hyperlipidemia, unspecified: Secondary | ICD-10-CM

## 2019-03-11 DIAGNOSIS — I4811 Longstanding persistent atrial fibrillation: Secondary | ICD-10-CM

## 2019-03-11 LAB — CBC
HCT: 39.1 % (ref 39.0–52.0)
Hemoglobin: 13.5 g/dL (ref 13.0–17.0)
MCH: 31 pg (ref 26.0–34.0)
MCHC: 34.5 g/dL (ref 30.0–36.0)
MCV: 89.9 fL (ref 80.0–100.0)
Platelets: 123 10*3/uL — ABNORMAL LOW (ref 150–400)
RBC: 4.35 MIL/uL (ref 4.22–5.81)
RDW: 12.7 % (ref 11.5–15.5)
WBC: 7.2 10*3/uL (ref 4.0–10.5)
nRBC: 0 % (ref 0.0–0.2)

## 2019-03-11 LAB — BASIC METABOLIC PANEL
Anion gap: 10 (ref 5–15)
BUN: 21 mg/dL (ref 8–23)
CO2: 25 mmol/L (ref 22–32)
Calcium: 9.3 mg/dL (ref 8.9–10.3)
Chloride: 103 mmol/L (ref 98–111)
Creatinine, Ser: 1.44 mg/dL — ABNORMAL HIGH (ref 0.61–1.24)
GFR calc Af Amer: 50 mL/min — ABNORMAL LOW (ref >60)
GFR calc non Af Amer: 43 mL/min — ABNORMAL LOW (ref >60)
Glucose, Bld: 149 mg/dL — ABNORMAL HIGH (ref 70–99)
Potassium: 4.6 mmol/L (ref 3.5–5.1)
Sodium: 138 mmol/L (ref 135–145)

## 2019-03-11 LAB — GLUCOSE, CAPILLARY: Glucose-Capillary: 138 mg/dL — ABNORMAL HIGH (ref 70–99)

## 2019-03-11 MED ORDER — ISOSORBIDE MONONITRATE ER 30 MG PO TB24: 30.0000 mg | ORAL_TABLET | Freq: Every day | ORAL | 2 refills | Status: DC

## 2019-03-11 MED ORDER — ANGIOPLASTY BOOK
Freq: Once | Status: AC
  Administered 2019-03-11: 10:00:00
  Filled 2019-03-11: qty 1

## 2019-03-11 MED ORDER — NITROGLYCERIN 0.4 MG SL SUBL: 0.4000 mg | SUBLINGUAL_TABLET | SUBLINGUAL | 2 refills | Status: AC | PRN

## 2019-03-11 MED FILL — NITROGLYCERIN 0.4 MG TAB SL: 0.4 | 8 days supply | Qty: 25 | Fill #0

## 2019-03-11 MED FILL — ISOSORBIDE MN ER 30 MG TAB: 30 | 30 days supply | Qty: 30 | Fill #0

## 2019-03-11 NOTE — Progress Notes (Addendum)
CARDIAC REHAB PHASE I   PRE:  Rate/Rhythm: 63 Afib  BP:  Sitting: 116/74      SaO2: 95 RA  MODE:  Ambulation: 470 ft   POST:  Rate/Rhythm: 114 Afib  BP:  Sitting: 148/79    SaO2: 95 RA   Pt ambulated 435ft in hallway standby assist with steady gait. Pt denies CP, SOB, or lightheadedness. States he feels "much better" today than previously. Reviewed importance of ASA, Plavix, and Imdur. Also reviewed diet, restrictions and exercise guidelines. Will re-refer to CRP II [place], pt not able to participate in Virtual Cardiac Rehab.   [phone] Rufina Falco, RN BSN 03/11/2019 8:56 AM

## 2019-03-11 NOTE — Discharge Summary (Addendum)
Discharge Summary    Patient ID: SAHEJ SCHRIEBER,  MRN: [id number], DOB/AGE: [birth year] 85 y.o.  Admit date: 03/07/2019 Discharge date: 03/11/2019  Primary Care Provider: Christain [place] Primary Cardiologist: Dr. Angelena Form   Discharge Diagnoses    Active Problems:   Coronary artery disease involving native coronary artery of native heart with unstable angina pectoris [facility])   Hypertension   Dyslipidemia   Atrial fibrillation ([place])   Coronary artery disease involving native coronary artery with unstable angina pectoris ([facility])   Allergies Allergies  Allergen Reactions   Metformin And Related Diarrhea and Other (See Comments)    flatulence   Allopurinol Other (See Comments)    Painful skin nodules   Percocet [Oxycodone-Acetaminophen] Other (See Comments)    Unknown reaction   Rabeprazole Sodium Other (See Comments)    Unknown reaction   Statins Other (See Comments)    Hmg-Coa Reductase inhibitors, Myalgias (intolerance)    Diagnostic Studies/Procedures    Cath: 03/10/19   Mid LM lesion is 95% stenosed.  Prox LAD lesion is 100% stenosed.  1st Diag lesion is 95% stenosed.  Ost Cx to Prox Cx lesion is 95% stenosed.  Ost 1st Mrg lesion is 80% stenosed.  Mid RCA lesion is 70% stenosed.  Dist RCA lesion is 50% stenosed.  RPAV lesion is 70% stenosed.  SVG.  Origin to Prox Graft lesion is 40% stenosed.  Origin to Prox Graft lesion is 90% stenosed.  Mid Graft to Dist Graft lesion is 85% stenosed.  LIMA and is normal in caliber.  Previously placed Dist Graft stent (unknown type) is widely patent.  A drug-eluting stent was successfully placed using a STENT RESOLUTE ONYX 2.25X18.  Post intervention, there is a 0% residual stenosis.  A drug-eluting stent was successfully placed using a STENT RESOLUTE ONYX 3.0X12.  Post intervention, there is a 0% residual stenosis.  A drug-eluting stent was successfully placed using a STENT RESOLUTE  [id number].  Post intervention, there is a 0% residual stenosis.   1. Severe triple vessel CAD s/p 4V CABG with 4/4 patent bypass grafts 2. Occluded mid LAD. Patent LIMA to LAD 3. Occluded Diagonal. Patent SVG to Diagonal with severe stenosis in the anastomosis of the SVG to the Diagonal extending into the Diagonal branch. Successful PTCA/DES x 1 SVG to Diagonal. 4. Occluded OM branch. Patent SVG to OM with severe stenosis proximal and mid body of vein graft. Successful PTCA/DES x 2 body of SVG to OM 5. Severe stenosis RCA. Patent SVG to RCA.  6. Severe left main stenosis and severe stenosis in the stent in the proximal Circumflex artery.   Recommendations: I do not think the left main and Circumflex lesion is favorable for PCI. The angulation of the Circumflex would make delivering a stent difficult. I chose to work on both vein grafts. Will plan medical management for now of the left main disease. Continue ASA and Plavix.   Diagnostic Dominance: Right  Intervention   _____________   History of Present Illness     Mr. Basnett is a 85 y.o. male with hx of CAD s/p CABG 2002, DES to [facility] 2009, NSTEMI 02/2019, HTN, HLD, permanent atrial fibrillation-refuses OAC, DM and COPD. Admitted for worsening exertional angina. He was last seen in clinic in January 2020 by Ermalinda Barrios, PA-C.  He was admitted 6/2-6/4 with a NSTEMI.  Cardiac catheterization demonstrated significant underlying three-vessel CAD with patent LIMA-LAD.  There was severe disease affecting the SVG-OM, SVG-DX, SVG-RPDA and severe in-stent  restenosis in the native LCx and new severe stenosis in the mid left main.  Dr. [facility] Anon performed this procedure.  It was difficult to discern the exact culprit for the NSTEMI.  It was not felt that successful PCI of the SVG-OM would result in long-term patency.  The disease in the SVG-DX was at the anastomosis with extension into the proximal portion of the diagonal branch.  It was noted there  was significant vessel mismatch with the native diagonal.  PCI of the left main and LCx would be challenging given the 90 degree takeoff of the LCx from the left main with calcifications and previous stents in the proximal LCx.  After consultation with his colleagues, it was decided to proceed with PCI of the vein graft with the largest territory and least procedure risk.  Therefore, he underwent DES to the SVG-RPDA.  Post PCI course was uneventful.  He called in to the office recently with recurrent anginal symptoms.  Therefore, he was added on the schedule for further evaluation.  He was seen in the office with Richardson Dopp on 03/07/19. Reported for the first 2 days after discharge, he had no real chest symptoms.  However, since then he has developed chest discomfort with mild to moderate exertion.  He had developed chest burning with washing his car as well as with attaching a hose to the down spout to put water in his pond.  He had associated shortness of breath and left arm discomfort.  He had not had syncope, paroxysmal nocturnal dyspnea, leg swelling.  He had not had any bleeding issues. He reported anginal symptoms while at the office and was sent for direct admission.   Hospital Course     Troponins remained negative. EKG in the office actually reported new TWI. Placed on Imdur 30mg  daily. Given his known residual disease, he was taken for repeat cath on 03/10/19 with Dr. Angelena Form noted above with severe triple vessel disease with 4/4 patent , but noted graft disease. Previously placed DES to SVG-RPDA was patent. Intervention to SVG to Diag successful with PCI/DES x1, and successful PCI/DES x2 to SVG-OM. Also noted to have severe LM stenosis as well as stenosis in the pLCx which was not intervened on 2/2 to the angulation of the vessel. Felt this would be very difficult to deliver stent. Patent LIMA-LAD. Plan to continue on DAPT with ASA/plavix and medically manage residual disease. No recurrent chest  pain. Walked with cardiac rehab. Remained in rate controlled Afib throughout admission. Of note historically declined anticoagulation in the past. He is intolerant to statins. Recent Hgb A1c 7.6 and has declined medical therapy for this as well.    General: Well developed, well nourished, male appearing in no acute distress. Head: Normocephalic, atraumatic.  Neck: Supple without bruits, JVD. Lungs:  Resp regular and unlabored, CTA. Heart: Irreg Irreg, S1, S2, no S3, S4, or murmur; no rub. Abdomen: Soft, non-tender, non-distended with normoactive bowel sounds. No hepatomegaly. No rebound/guarding. No obvious abdominal masses. Extremities: No clubbing, cyanosis, edema. Distal pedal pulses are 2+ bilaterally. Left radial cath site stable without bruising or hematoma Neuro: Alert and oriented X 3. Moves all extremities spontaneously. Psych: Normal affect.  [name] was seen by Dr. Gwenlyn Found and determined stable for discharge home. Follow up in the office has been arranged. Medications are listed below.   _____________  Discharge Vitals Blood pressure 119/68, pulse 64, temperature 98.1 F (36.7 C), temperature source Oral, resp. rate (!) 30, height 5\' 9"  (1.753  m), weight 77.7 kg, SpO2 98 %.  Filed Weights   03/09/19 0606 03/10/19 0608 03/11/19 0611  Weight: 77.7 kg 77.2 kg 77.7 kg    Labs & Radiologic Studies    CBC Recent Labs    03/10/19 0444 03/11/19 0640  WBC 4.6 7.2  HGB 12.5* 13.5  HCT 36.8* 39.1  MCV 89.3 89.9  PLT 110* 762*   Basic Metabolic Panel Recent Labs    03/10/19 0444 03/11/19 0640  NA 137 138  K 3.8 4.6  CL 105 103  CO2 23 25  GLUCOSE 130* 149*  BUN 22 21  CREATININE 1.34* 1.44*  CALCIUM 8.9 9.3   Liver Function Tests No results for input(s): AST, ALT, ALKPHOS, BILITOT, PROT, ALBUMIN in the last 72 hours. No results for input(s): LIPASE, AMYLASE in the last 72 hours. Cardiac Enzymes No results for input(s): CKTOTAL, CKMB, CKMBINDEX,  TROPONINI in the last 72 hours. BNP Invalid input(s): POCBNP D-Dimer No results for input(s): DDIMER in the last 72 hours. Hemoglobin A1C No results for input(s): HGBA1C in the last 72 hours. Fasting Lipid Panel No results for input(s): CHOL, HDL, LDLCALC, TRIG, CHOLHDL, LDLDIRECT in the last 72 hours. Thyroid Function Tests No results for input(s): TSH, T4TOTAL, T3FREE, THYROIDAB in the last 72 hours.  Invalid input(s): FREET3 _____________  Dg Chest Portable 1 View  Result Date: 02/18/2019 CLINICAL DATA:  Chest pain EXAM: PORTABLE CHEST 1 VIEW COMPARISON:  October 01, 2007 FINDINGS: No edema or consolidation. Heart is upper normal in size with pulmonary vascularity normal. Patient is status post coronary artery bypass grafting. There is aortic atherosclerosis. No adenopathy. No bone lesions. IMPRESSION: No edema or consolidation. Heart upper normal in size. Postoperative changes noted. Aortic Atherosclerosis (ICD10-I70.0). Electronically Signed   By: Lowella Grip III [name]   On: 02/18/2019 15:55   Disposition   Pt is being discharged home today in good condition.  Follow-up Plans & Appointments    Follow-up Information    Liliane Shi, PA-C Follow up on 03/21/2019.   Specialties: Cardiology, Physician Assistant Why: at 8:45am for your follow up appt. This will be a virtual visit through your mobile phone.  Contact information: [address] [place] [address] [phone]          Discharge Instructions    Amb Referral to Cardiac Rehabilitation   Complete by: As directed    Diagnosis: Coronary Stents   After initial evaluation and assessments completed: Virtual Based Care may be provided alone or in conjunction with Phase 2 Cardiac Rehab based on patient barriers.: No   Call MD for:  redness, tenderness, or signs of infection (pain, swelling, redness, odor or green/yellow discharge around incision site)   Complete by: As directed    Diet - low  sodium heart healthy   Complete by: As directed    Discharge instructions   Complete by: As directed    Radial Site Care Refer to this sheet in the next few weeks. These instructions provide you with information on caring for yourself after your procedure. Your caregiver may also give you more specific instructions. Your treatment has been planned according to current medical practices, but problems sometimes occur. Call your caregiver if you have any problems or questions after your procedure. HOME CARE INSTRUCTIONS You may shower the day after the procedure.Remove the bandage (dressing) and gently wash the site with plain soap and water.Gently pat the site dry.  Do not apply powder or lotion to the  site.  Do not submerge the affected site in water for 3 to 5 days.  Inspect the site at least twice daily.  Do not flex or bend the affected arm for 24 hours.  No lifting over 5 pounds (2.3 kg) for 5 days after your procedure.  Do not drive home if you are discharged the same day of the procedure. Have someone else drive you.  You may drive 24 hours after the procedure unless otherwise instructed by your caregiver.  What to expect: Any bruising will usually fade within 1 to 2 weeks.  Blood that collects in the tissue (hematoma) may be painful to the touch. It should usually decrease in size and tenderness within 1 to 2 weeks.  SEEK IMMEDIATE MEDICAL CARE IF: You have unusual pain at the radial site.  You have redness, warmth, swelling, or pain at the radial site.  You have drainage (other than a small amount of blood on the dressing).  You have chills.  You have a fever or persistent symptoms for more than 72 hours.  You have a fever and your symptoms suddenly get worse.  Your arm becomes pale, cool, tingly, or numb.  You have heavy bleeding from the site. Hold pressure on the site.   PLEASE DO NOT MISS ANY DOSES OF YOUR PLAVIX!!!!! Also keep a log of you blood pressures and bring back to  your follow up appt. Please call the office with any questions.   Patients taking blood thinners should generally stay away from medicines like ibuprofen, Advil, Motrin, naproxen, and Aleve due to risk of stomach bleeding. You may take Tylenol as directed or talk to your primary doctor about alternatives.   Increase activity slowly   Complete by: As directed        Discharge Medications     Medication List    TAKE these medications   aspirin EC 81 MG tablet Take 81 mg by mouth daily.   Benadryl Allergy 25 MG tablet Generic drug: diphenhydrAMINE Take 25 mg by mouth at bedtime.   clopidogrel 75 MG tablet Commonly known as: PLAVIX Take 1 tablet (75 mg total) by mouth daily with breakfast.   ezetimibe 10 MG tablet Commonly known as: ZETIA Take 1 tablet (10 mg total) by mouth daily. What changed: when to take this   fish oil-omega-3 fatty acids 1000 MG capsule Take 1-2 g by mouth See admin instructions. Take 2 capsules (2 g) by mouth every morning and 1 capsule (1 g) every evening   isosorbide mononitrate 30 MG 24 hr tablet Commonly known as: IMDUR Take 1 tablet (30 mg total) by mouth daily.   metoprolol succinate 25 MG 24 hr tablet Commonly known as: TOPROL-XL Take 0.5 tablets (12.5 mg total) by mouth every evening.   nitroGLYCERIN 0.4 MG SL tablet Commonly known as: NITROSTAT Place 1 tablet (0.4 mg total) under the tongue every 5 (five) minutes x 3 doses as needed for chest pain.   pantoprazole 40 MG tablet Commonly known as: Protonix Take 1 tablet (40 mg total) by mouth daily.   vitamin B-12 100 MCG tablet Commonly known as: CYANOCOBALAMIN Take 100 mcg by mouth daily.   vitamin C with rose hips 1000 MG tablet Take 1,000 mg by mouth daily.        Acute coronary syndrome (MI, NSTEMI, STEMI, etc) this admission?: No.     Outstanding Labs/Studies   N/a   Duration of Discharge Encounter   Greater than 30 minutes including physician  time.  Signed, Reino Bellis NP-C 03/11/2019, 9:20 AM   Agree with note by Reino Bellis NP-C  OK for DC home. S/P PCI Diag SVG anastomosis and Stent LCX-OM SVG with excellent results. Pt feels clinically improved . OK for DC home this AM. ROV with Dr Angelena Form.  Lorretta Harp, [name], [place], [facility], [place] [address] [place], [place]  [address]  [phone] 03/11/2019 9:54 AM

## 2019-03-11 NOTE — Care Management Important Message (Signed)
Important Message  Patient Details  Name: William Weber MRN: [id number] Date of Birth: 08/25/1930   Medicare Important Message Given:  Yes     Shelda Altes 03/11/2019, 1:32 PM

## 2019-03-12 ENCOUNTER — Telehealth: Payer: Self-pay | Admitting: *Deleted

## 2019-03-12 ENCOUNTER — Telehealth: Payer: Medicare Other | Admitting: Physician Assistant

## 2019-03-12 NOTE — Telephone Encounter (Signed)
Spoke with patient and patient consent to telephone visit     Confirm consent - "In the setting of the current Covid19 crisis, you are scheduled for a (phone or video) visit with your provider on (date) at (time).  Just as we do with many in-office visits, in order for you to participate in this visit, we must obtain consent.  If you'd like, I can send this to your mychart (if signed up) or email for you to review.  Otherwise, I can obtain your verbal consent now.  All virtual visits are billed to your insurance company just like a normal visit would be.  By agreeing to a virtual visit, we'd like you to understand that the technology does not allow for your provider to perform an examination, and thus may limit your provider's ability to fully assess your condition. If your provider identifies any concerns that need to be evaluated in person, we will make arrangements to do so.  Finally, though the technology is pretty good, we cannot assure that it will always work on either your or our end, and in the setting of a video visit, we may have to convert it to a phone-only visit.  In either situation, we cannot ensure that we have a secure connection.  Are you willing to proceed?" STAFF: Did the patient verbally acknowledge consent to telehealth visit? Document YES/NO here: YES    TELEPHONE CALL NOTE  William Weber has been deemed a candidate for a follow-up tele-health visit to limit community exposure during the Covid-19 pandemic. I spoke with the patient via phone to ensure availability of phone/video source, confirm preferred email & phone number, and discuss instructions and expectations.  I reminded William Weber to be prepared with any vital sign and/or heart rhythm information that could potentially be obtained via home monitoring, at the time of his visit. I reminded William Weber to expect a phone call prior to his visit.  Claude Manges, [place] 03/12/2019 10:11 AM    FULL  LENGTH CONSENT FOR TELE-HEALTH VISIT   I hereby voluntarily request, consent and authorize [facility] and its employed or contracted physicians, physician assistants, nurse practitioners or other licensed health care professionals (the Practitioner), to provide me with telemedicine health care services (the "Services") as deemed necessary by the treating Practitioner. I acknowledge and consent to receive the Services by the Practitioner via telemedicine. I understand that the telemedicine visit will involve communicating with the Practitioner through live audiovisual communication technology and the disclosure of certain medical information by electronic transmission. I acknowledge that I have been given the opportunity to request an in-person assessment or other available alternative prior to the telemedicine visit and am voluntarily participating in the telemedicine visit.  I understand that I have the right to withhold or withdraw my consent to the use of telemedicine in the course of my care at any time, without affecting my right to future care or treatment, and that the Practitioner or I may terminate the telemedicine visit at any time. I understand that I have the right to inspect all information obtained and/or recorded in the course of the telemedicine visit and may receive copies of available information for a reasonable fee.  I understand that some of the potential risks of receiving the Services via telemedicine include:  Marland Kitchen Delay or interruption in medical evaluation due to technological equipment failure or disruption; . Information transmitted may not be sufficient (e.g. poor resolution of images) to allow for appropriate medical  decision making by the Practitioner; and/or  . In rare instances, security protocols could fail, causing a breach of personal health information.  Furthermore, I acknowledge that it is my responsibility to provide information about my medical history, conditions  and care that is complete and accurate to the best of my ability. I acknowledge that Practitioner's advice, recommendations, and/or decision may be based on factors not within their control, such as incomplete or inaccurate data provided by me or distortions of diagnostic images or specimens that may result from electronic transmissions. I understand that the practice of medicine is not an exact science and that Practitioner makes no warranties or guarantees regarding treatment outcomes. I acknowledge that I will receive a copy of this consent concurrently upon execution via email to the email address I last provided but may also request a printed copy by calling the office of [facility].    I understand that my insurance will be billed for this visit.   I have read or had this consent read to me. . I understand the contents of this consent, which adequately explains the benefits and risks of the Services being provided via telemedicine.  . I have been provided ample opportunity to ask questions regarding this consent and the Services and have had my questions answered to my satisfaction. . I give my informed consent for the services to be provided through the use of telemedicine in my medical care  By participating in this telemedicine visit I agree to the above.

## 2019-03-13 NOTE — Progress Notes (Signed)
Virtual Visit via Telephone Note   This visit type was conducted due to national recommendations for restrictions regarding the COVID-19 Pandemic (e.g. social distancing) in an effort to limit this patient's exposure and mitigate transmission in our community.  Due to his co-morbid illnesses, this patient is at least at moderate risk for complications without adequate follow up.  This format is felt to be most appropriate for this patient at this time.  The patient did not have access to video technology/had technical difficulties with video requiring transitioning to audio format only (telephone).  All issues noted in this document were discussed and addressed.  No physical exam could be performed with this format.  Please refer to the patient's chart for his  consent to telehealth for [facility].   Date:  03/14/2019   ID:  [name], DOB 1930-03-31, MRN [id number]  Patient Location: Home Provider Location: Home  PCP:  Christain Sacramento, MD  Cardiologist:  Lauree Chandler, MD   Electrophysiologist:  None   Evaluation Performed:  Follow-Up Visit  Chief Complaint:  Post hospital follow up; s/p PCI  History of Present Illness:    MARSHON BANGS is a 85 y.o. male with:  Coronary artery disease ? Status post CABG in 2002 ? PCI 2009: DES to the LCx ? NSTEMI 02/2019: PCI - DES to S-RPDA  Paroxysmal atrial fibrillation ? Patient has declined anticoagulation  Carotid artery disease  Diabetes mellitus  Hypertension  Hyperlipidemia ? Statin intolerance  COPD   Mr. Morgan was admitted from the office on 03/07/2019 for Unstable angina.  He had recently been admitted 02/18/2019 with a NSTEMI and underwent PCI with a DES to the SVG-RPDA.  There was severe disease in the vein graft to the obtuse marginal, vein graft to the diagonal and in-stent restenosis in the native LCx.  PCI of the vein graft to the obtuse marginal and vein graft to the diagonal was not felt  to be amenable.  PCI of the left main and LCx was also felt to be challenging.  However, he had recurrent symptoms that were felt to be unstable.  He underwent cardiac catheterization with Dr. Angelena Form with PCI with a DES to the vein graft to the diagonal and DES x2 to the vein graft to the obtuse marginal.  PCI of the native LCx and left main was felt to be challenging and medical therapy was recommended.  Post PCI course was uneventful.  Today, he is feeling much better.  He has not had any recurrent burning in his chest like he had prior to the procedure.  His left wrist has healed well and is not swollen or painful.  He did have some shortness of breath after eating last night.  This was a heavier meal and he did eat late at night.  He has not had any other episodes of shortness of breath.  He was able to wash his truck a few days ago without any symptoms.  Prior to his procedure, he would have had to stop due to chest burning.  He has not had any syncope or near syncope, orthopnea, leg swelling.  The patient does not have symptoms concerning for COVID-19 infection (fever, chills, cough, or new shortness of breath).    Past Medical History:  Diagnosis Date   Anxiety    mild   Atrial fibrillation ([place])    CAD (coronary artery disease)    DES proximal circumflex January, 2009  /   nuclear, December,  2011, no ischemia, ejection fraction 70% 02/20/19 PCI/DES to SVG-rPDA 03/10/19 PCI/DES x1 to SVG-Diag, DESx2 to SVG to OM   Carotid artery disease ([place])    Doppler, August, 2011, zero 09% RIC A., 73-53% LICA, stable   Chronic headaches    COPD (chronic obstructive pulmonary disease) (H[facility])    Diabetic peripheral neuropathy ([place]) 01/22/2018   Dyslipidemia    statin intolerance   Ejection fraction    EF 70%, nuclear, December, 2011   GERD (gastroesophageal reflux disease)    Hiatal hernia    Hx of CABG    [place], 2002   Hypercholesterolemia    Hypertension    MGUS (monoclonal  gammopathy of unknown significance) 01/22/2018   Prostate cancer ([place])    TUR and implant   Rash    2009, etiology not clear, not from Plavix, Claritin helped   Statin intolerance    Past Surgical History:  Procedure Laterality Date   BLADDER SURGERY     CARDIAC SURGERY     CORONARY STENT INTERVENTION N/A 02/19/2019   Procedure: CORONARY STENT INTERVENTION;  Surgeon: Wellington Hampshire, MD;  Location: [place] [facility];  Service: Cardiovascular;  Laterality: N/A;   CORONARY STENT INTERVENTION N/A 03/10/2019   Procedure: CORONARY STENT INTERVENTION;  Surgeon: Burnell Blanks, MD;  Location: [place] [facility];  Service: Cardiovascular;  Laterality: N/A;   LEFT HEART CATH AND CORS/GRAFTS ANGIOGRAPHY N/A 02/19/2019   Procedure: LEFT HEART CATH AND CORS/GRAFTS ANGIOGRAPHY;  Surgeon: Wellington Hampshire, MD;  Location: [place] [facility];  Service: Cardiovascular;  Laterality: N/A;   LEFT HEART CATH AND CORS/GRAFTS ANGIOGRAPHY N/A 03/10/2019   Procedure: LEFT HEART CATH AND CORS/GRAFTS ANGIOGRAPHY;  Surgeon: Burnell Blanks, MD;  Location: [place] [facility];  Service: Cardiovascular;  Laterality: N/A;   PROSTATE SURGERY       Current Meds  Medication Sig   Ascorbic Acid (VITAMIN C WITH ROSE HIPS) 1000 MG tablet Take 1,000 mg by mouth daily.   aspirin EC 81 MG tablet Take 81 mg by mouth daily.   clopidogrel (PLAVIX) 75 MG tablet Take 1 tablet (75 mg total) by mouth daily with breakfast.   diphenhydrAMINE (BENADRYL ALLERGY) 25 MG tablet Take 25 mg by mouth at bedtime.    ezetimibe (ZETIA) 10 MG tablet Take 1 tablet (10 mg total) by mouth daily. (Patient taking differently: Take 10 mg by mouth at bedtime. )   fish oil-omega-3 fatty acids 1000 MG capsule Take 1-2 g by mouth See admin instructions. Take 2 capsules (2 g) by mouth every morning and 1 capsule (1 g) every evening   isosorbide mononitrate (IMDUR) 30 MG 24 hr tablet Take 1 tablet (30 mg total) by mouth daily.     metoprolol succinate (TOPROL-XL) 25 MG 24 hr tablet Take 0.5 tablets (12.5 mg total) by mouth every evening.   nitroGLYCERIN (NITROSTAT) 0.4 MG SL tablet Place 1 tablet (0.4 mg total) under the tongue every 5 (five) minutes x 3 doses as needed for chest pain.   pantoprazole (PROTONIX) 40 MG tablet Take 1 tablet (40 mg total) by mouth daily.   vitamin B-12 (CYANOCOBALAMIN) 100 MCG tablet Take 100 mcg by mouth daily.     Allergies:   Metformin and related, Allopurinol, Percocet [oxycodone-acetaminophen], Rabeprazole sodium, and Statins   Social History   Tobacco Use   Smoking status: Never Smoker   Smokeless tobacco: Never Used  Substance Use Topics   Alcohol use: No    Frequency: Never  Drug use: No     Family Hx: The patient's family history includes Diabetes in his brother and sister.  ROS:   Please see the history of present illness.     All other systems reviewed and are negative.   Prior CV studies:   The following studies were reviewed today:  Cardiac catheterization 03/10/2019 1. Severe triple vessel CAD s/p 4V CABG with 4/4 patent bypass grafts 2. Occluded mid LAD. Patent LIMA to LAD 3. Occluded Diagonal. Patent SVG to Diagonal with severe stenosis in the anastomosis of the SVG to the Diagonal extending into the Diagonal branch. Successful PTCA/DES x 1 SVG to Diagonal (2.25 x 18 mm Resolute Onyx DES). 4. Occluded OM branch. Patent SVG to OM with severe stenosis proximal and mid body of vein graft. Successful PTCA/DES x 2 body of SVG to OM (3 x 38 mm Resolute Onyx and 3 x 14 mm Resolute Onyx DES) 5. Severe stenosis RCA. Patent SVG to RCA.  6. Severe left main stenosis and severe stenosis in the stent in the proximal Circumflex artery.   Recommendations: I do not think the left main and Circumflex lesion is favorable for PCI. The angulation of the Circumflex would make delivering a stent difficult. I chose to work on both vein grafts. Will plan medical  management for now of the left main disease. Continue ASA and Plavix.      Echocardiogram 02/19/2019 EF 60-65, mild basal septal hypertrophy, mild LAE, moderate MAC  Cardiac catheterization 02/19/2019 LM mid 95 LAD proximal 100; D1 95 LCx ostial stent 95 ISR; OM1 ostial 80 RCA mid 70, distal 50; RPAV 70 SVG-D1 40 SVG-OM2 proximal 90, mid 85 SVG-RPDA distal 95 LIMA-LAD patent EF 55-65 PCI: 3 x 15 mm resolute Onyx DES to the SVG-RPDA     Carotid US 09/30/2018 R 1-39; L 60-79 Repeat 12 months  Labs/Other Tests and Data Reviewed:    EKG:  No ECG reviewed.  Recent Labs: 02/18/2019: B Natriuretic Peptide 204.7; Magnesium 1.8 03/07/2019: ALT 41 03/11/2019: BUN 21; Creatinine, Ser 1.44; Hemoglobin 13.5; Platelets 123; Potassium 4.6; Sodium 138   Recent Lipid Panel Lab Results  Component Value Date/Time   CHOL 163 02/19/2019 03:39 AM   CHOL 162 12/10/2017 09:39 AM   TRIG 127 02/19/2019 03:39 AM   HDL 45 02/19/2019 03:39 AM   HDL 37 (L) 12/10/2017 09:39 AM   CHOLHDL 3.6 02/19/2019 03:39 AM   LDLCALC 93 02/19/2019 03:39 AM   LDLCALC 104 (H) 12/10/2017 09:39 AM    Wt Readings from Last 3 Encounters:  03/14/19 173 lb (78.5 kg)  03/11/19 171 lb 3.2 oz (77.7 kg)  03/07/19 177 lb (80.3 kg)     Objective:    Vital Signs:  BP 130/71    Pulse 75    Ht _0  (1.753 m)    Wt 173 lb (78.5 kg)    BMI 25.55 kg/m    VITAL SIGNS:  reviewed GEN:  no acute distress RESPIRATORY:  No labored breathing NEURO:  Alert and oriented PSYCH:  Normal mood  ASSESSMENT & PLAN:    1. Coronary artery disease involving native coronary artery of native heart with angina pectoris ([place]) History of CABG in 2002 and subsequent PCI to the LCx in 2009.  Recently, he had a non-ST elevation myocardial infarction earlier this month treated with a DES to the vein graft to the RPDA.  He was readmitted June 19 for unstable anginal symptoms and underwent PCI with a DES to the  vein graft to the diagonal and DES x2  to the vein graft to the obtuse marginal.  He is currently doing well with improved symptoms.  He has not had any further chest burning.  Continue dual antiplatelet therapy with aspirin, clopidogrel.  He is intolerant to statins.  Continue ezetimibe, nitrates, beta-blocker.  Follow-up with Dr. Angelena Form in 3 months.  2. Persistent atrial fibrillation He has declined anticoagulation in the past.  3. Essential hypertension The patient's blood pressure is controlled on his current regimen.  Continue current therapy.   4. Hyperlipidemia, unspecified hyperlipidemia type Continue ezetimibe.  5. Educated About Covid-19 Virus Infection The signs and symptoms of COVID-19 were discussed with the patient and how to seek care for testing (follow up with PCP or arrange E-visit).  The importance of social distancing was discussed today.  Time:   Today, I have spent 12 minutes with the patient with telehealth technology discussing the above problems.     Medication Adjustments/Labs and Tests Ordered: Current medicines are reviewed at length with the patient today.  Concerns regarding medicines are outlined above.   Tests Ordered: No orders of the defined types were placed in this encounter.   Medication Changes: No orders of the defined types were placed in this encounter.   Follow Up:  In Person in 3 month(s) with Dr. Angelena Form  Signed, Richardson Dopp, PA-C  03/14/2019 12:37 PM    [place]

## 2019-03-14 ENCOUNTER — Telehealth (INDEPENDENT_AMBULATORY_CARE_PROVIDER_SITE_OTHER): Payer: Medicare Other | Admitting: Physician Assistant

## 2019-03-14 ENCOUNTER — Encounter: Payer: Self-pay | Admitting: Physician Assistant

## 2019-03-14 ENCOUNTER — Other Ambulatory Visit: Payer: Self-pay

## 2019-03-14 VITALS — BP 130/71 | HR 75 | Ht 69.0 in | Wt 173.0 lb

## 2019-03-14 DIAGNOSIS — I4819 Other persistent atrial fibrillation: Secondary | ICD-10-CM

## 2019-03-14 DIAGNOSIS — I10 Essential (primary) hypertension: Secondary | ICD-10-CM

## 2019-03-14 DIAGNOSIS — I25119 Atherosclerotic heart disease of native coronary artery with unspecified angina pectoris: Secondary | ICD-10-CM

## 2019-03-14 DIAGNOSIS — Z7189 Other specified counseling: Secondary | ICD-10-CM

## 2019-03-14 DIAGNOSIS — E785 Hyperlipidemia, unspecified: Secondary | ICD-10-CM

## 2019-03-14 NOTE — Patient Instructions (Addendum)
Medication Instructions:  Your physician recommends that you continue on your current medications as directed. Please refer to the Current Medication list given to you today.   If you need a refill on your cardiac medications before your next appointment, please call your pharmacy.   Lab work: NONE ORDERED  TODAY   If you have labs (blood work) drawn today and your tests are completely normal, you will receive your results only by: Marland Kitchen MyChart Message (if you have MyChart) OR . A paper copy in the mail If you have any lab test that is abnormal or we need to change your treatment, we will call you to review the results.  Testing/Procedures: NONE ORDERED  TODAY   Follow-Up: in 3 months with Angelena Form Kathlen Mody    Any Other Special Instructions Will Be Listed Below (If Applicable).

## 2019-03-20 ENCOUNTER — Telehealth (HOSPITAL_COMMUNITY): Payer: Self-pay

## 2019-03-20 ENCOUNTER — Telehealth (HOSPITAL_COMMUNITY): Payer: Self-pay | Admitting: *Deleted

## 2019-03-20 NOTE — Telephone Encounter (Signed)
-----   Message from Burnell Blanks, MD sent at 03/20/2019 10:26 AM EDT ----- Regarding: RE: Ok to participate in Group Exercise at Cardiac Rehab? I think he can go to rehab. Thanks ----- Message ----- From: Rowe Pavy, RN Sent: 03/19/2019   5:03 PM EDT To: Burnell Blanks, MD Subject: Ok to participate in Group Exercise at [facility]  We are preparing for patients to phase in reentry to on site cardiac rehab. A great deal of planning with advisement from our Medical Director - Dr. Radford Pax, CV Service line leadership, Infection Disease Control, Facilities, security,recommendations from American Association of Cardiac and Pulmonary Rehab (AACVPR) with the goal for optimal patient safety. Patients will have strict guidelines and criteria they must adhere and follow.  Pt will have to complete screenings prior to their scheduled appointment and  again prior to entry into gym area.  Your pt,  William Weber   expressed great interest in returning to in facility cardiac rehab. Pt has a Covid risk score  8 .  Do you feel this pt is appropriate to resume in facility cardiac rehab?  Any additional restrictions you feel are appropriate for this pt?   Thank you and we appreciate your input  Maurice Small RN, BSN Cardiac and Pulmonary Rehab Nurse Navigator

## 2019-03-20 NOTE — Telephone Encounter (Signed)
Pt insurance is active and benefits verified through [facility] Co-pay 0, DED 0/0 met, out of pocket $3,900/$2,188.61 met, co-insurance 20%. no pre-authorization required, REF# [id number]  Will contact patient to see if he is interested in the Cardiac Rehab Program. If interested, patient will need to complete follow up appt. Once completed, patient will be contacted for scheduling upon review by the RN Navigator.

## 2019-03-21 ENCOUNTER — Other Ambulatory Visit: Payer: Self-pay | Admitting: Cardiology

## 2019-04-09 ENCOUNTER — Other Ambulatory Visit: Payer: Self-pay | Admitting: Cardiology

## 2019-06-05 ENCOUNTER — Ambulatory Visit (INDEPENDENT_AMBULATORY_CARE_PROVIDER_SITE_OTHER): Payer: Medicare Other | Admitting: Cardiovascular Disease

## 2019-06-05 ENCOUNTER — Other Ambulatory Visit: Payer: Self-pay

## 2019-06-05 VITALS — BP 118/63 | HR 80 | Ht 69.0 in | Wt 175.0 lb

## 2019-06-05 DIAGNOSIS — I4819 Other persistent atrial fibrillation: Secondary | ICD-10-CM

## 2019-06-05 DIAGNOSIS — I25119 Atherosclerotic heart disease of native coronary artery with unspecified angina pectoris: Secondary | ICD-10-CM

## 2019-06-05 DIAGNOSIS — E785 Hyperlipidemia, unspecified: Secondary | ICD-10-CM | POA: Diagnosis not present

## 2019-06-05 DIAGNOSIS — I10 Essential (primary) hypertension: Secondary | ICD-10-CM | POA: Diagnosis not present

## 2019-06-05 MED ORDER — ISOSORBIDE MONONITRATE ER 30 MG PO TB24: 30.0000 mg | ORAL_TABLET | Freq: Every day | ORAL | 3 refills | Status: DC

## 2019-06-05 MED ORDER — ISOSORBIDE MONONITRATE ER 30 MG PO TB24: 30.0000 mg | ORAL_TABLET | Freq: Every day | ORAL | 2 refills | Status: DC

## 2019-06-05 NOTE — Patient Instructions (Signed)
Medication Instructions:  No changes If you need a refill on your cardiac medications before your next appointment, please call your pharmacy.   Lab work: none If you have labs (blood work) drawn today and your tests are completely normal, you will receive your results only by: . MyChart Message (if you have MyChart) OR . A paper copy in the mail If you have any lab test that is abnormal or we need to change your treatment, we will call you to review the results.  Testing/Procedures: none  Follow-Up: At [facility], you and your health needs are our priority.  As part of our continuing mission to provide you with exceptional heart care, we have created designated Provider Care Teams.  These Care Teams include your primary Cardiologist (physician) and Advanced Practice Providers (APPs -  Physician Assistants and Nurse Practitioners) who all work together to provide you with the care you need, when you need it. You will need a follow up appointment in 12 months.  Please call our office 2 months in advance to schedule this appointment.  You may see Christopher McAlhany, MD or one of the following Advanced Practice Providers on your designated Care Team:   Brittainy Simmons, PA-C Dayna Dunn, PA-C . Michele Lenze, PA-C  Any Other Special Instructions Will Be Listed Below (If Applicable).    

## 2019-06-05 NOTE — Progress Notes (Signed)
Chief Complaint  Patient presents with   Follow-up    CAD   History of Present Illness: 85 yo male with history of CAD s/p CABG, atrial fibrillation, HTN, HLD, COPD and statin intolerance here today for cardiac follow up. He had been followed by Dr. Ron Parker. He had a 5 vessel CABG in 2002 (LIMA to LAD, SVG to D1, sequential SVG to PDA and PLA, SVG to OM1)and a drug eluting stent placed in the Circumflex in 2009. 4 bypass grafts were noted to be patent by cath in 2009. The vein graft to the PDA did not supply the PLA as reported in the op report. He is known to have PAF but has refused anti-coagulation in the past. Echo January 2015 with normal LV systolic function, trivial AI, mild MR. He has been on ASA only. He has carotid artery disease ([id number] LICA stenosis and [id number] RICA stenosis by dopplers 2016). He was admitted to [facility] June 2020 with a NSTEMI and had a drug eluting stent placed in the vein graft to the PDA. Also with severe disease in the vein graft to the diagonal and the vein graft to the OM with patent LIMA to LAD. He was re-admitted to  [facility] 03/07/19 and had cardiac cath with placement of drug eluting stents in the vein graft to the Diagonal and in the vein graft to the OM. Echo June 2020 with normal LV systolic function and no significant valve disease.   He is here today for follow up. The patient denies any chest pain, dyspnea, palpitations, lower extremity edema, orthopnea, PND, dizziness, near syncope or syncope.  He has been very active. Rare chest pains.   Primary Care Physician: Christain Sacramento, MD  Past Medical History:  Diagnosis Date   Anxiety    mild   Atrial fibrillation [facility])    CAD (coronary artery disease)    DES proximal circumflex January, 2009  /   nuclear, December, 2011, no ischemia, ejection fraction 70% 02/20/19 PCI/DES to SVG-rPDA 03/10/19 PCI/DES x1 to SVG-Diag, DESx2 to SVG to OM   Carotid artery disease ([place])    Doppler, August, 2011, zero 39% RIC A.,  [id number] LICA, stable   Chronic headaches    COPD (chronic obstructive pulmonary disease) ([place])    Diabetic peripheral neuropathy ([place]) 01/22/2018   Dyslipidemia    statin intolerance   Ejection fraction    EF 70%, nuclear, December, 2011   GERD (gastroesophageal reflux disease)    Hiatal hernia    Hx of CABG    Hendrickson, 2002   Hypercholesterolemia    Hypertension    MGUS (monoclonal gammopathy of unknown significance) 01/22/2018   Prostate cancer ([place])    TUR and implant   Rash    2009, etiology not clear, not from Plavix, Claritin helped   Statin intolerance     Past Surgical History:  Procedure Laterality Date   BLADDER SURGERY     CARDIAC SURGERY     CORONARY STENT INTERVENTION N/A 02/19/2019   Procedure: CORONARY STENT INTERVENTION;  Surgeon: Wellington Hampshire, MD;  Location: [place] [facility];  Service: Cardiovascular;  Laterality: N/A;   CORONARY STENT INTERVENTION N/A 03/10/2019   Procedure: CORONARY STENT INTERVENTION;  Surgeon: Burnell Blanks, MD;  Location: [place] [facility];  Service: Cardiovascular;  Laterality: N/A;   LEFT HEART CATH AND CORS/GRAFTS ANGIOGRAPHY N/A 02/19/2019   Procedure: LEFT HEART CATH AND CORS/GRAFTS ANGIOGRAPHY;  Surgeon: Wellington Hampshire, MD;  Location: [place] [facility]  [facility];  Service: Cardiovascular;  Laterality: N/A;   LEFT HEART CATH AND CORS/GRAFTS ANGIOGRAPHY N/A 03/10/2019   Procedure: LEFT HEART CATH AND CORS/GRAFTS ANGIOGRAPHY;  Surgeon: Burnell Blanks, MD;  Location: [place] [facility];  Service: Cardiovascular;  Laterality: N/A;   PROSTATE SURGERY      Current Outpatient Medications  Medication Sig Dispense Refill   Ascorbic Acid (VITAMIN C WITH ROSE HIPS) 1000 MG tablet Take 1,000 mg by mouth daily.     aspirin EC 81 MG tablet Take 81 mg by mouth daily.     clopidogrel (PLAVIX) 75 MG tablet Take 1 tablet (75 mg total) by mouth daily with breakfast. 90 tablet 2   ezetimibe (ZETIA) 10 MG tablet  Take 1 tablet (10 mg total) by mouth daily. (Patient taking differently: Take 10 mg by mouth at bedtime. ) 30 tablet 11   fish oil-omega-3 fatty acids 1000 MG capsule Take 1-2 g by mouth See admin instructions. Take 2 capsules (2 g) by mouth every morning and 1 capsule (1 g) every evening     isosorbide mononitrate (IMDUR) 30 MG 24 hr tablet Take 1 tablet (30 mg total) by mouth daily. 90 tablet 3   metoprolol succinate (TOPROL-XL) 25 MG 24 hr tablet Take 0.5 tablets (12.5 mg total) by mouth every evening. 30 tablet 1   nitroGLYCERIN (NITROSTAT) 0.4 MG SL tablet Place 1 tablet (0.4 mg total) under the tongue every 5 (five) minutes x 3 doses as needed for chest pain. 25 tablet 2   vitamin B-12 (CYANOCOBALAMIN) 100 MCG tablet Take 100 mcg by mouth daily.     pantoprazole (PROTONIX) 40 MG tablet Take 1 tablet (40 mg total) by mouth daily. 30 tablet 1   No current facility-administered medications for this visit.     Allergies  Allergen Reactions   Metformin And Related Diarrhea and Other (See Comments)    flatulence   Allopurinol Other (See Comments)    Painful skin nodules   Percocet [Oxycodone-Acetaminophen] Other (See Comments)    Unknown reaction   Rabeprazole Sodium Other (See Comments)    Unknown reaction   Statins Other (See Comments)    Hmg-Coa Reductase inhibitors, Myalgias (intolerance)    Social History   Socioeconomic History   Marital status: Married    Spouse name: Not on file   Number of children: Not on file   Years of education: Not on file   Highest education level: Not on file  Occupational History   Not on file  Social Needs   Financial resource strain: Not on file   Food insecurity    Worry: Not on file    Inability: Not on file   Transportation needs    Medical: Not on file    Non-medical: Not on file  Tobacco Use   Smoking status: Never Smoker   Smokeless tobacco: Never Used  Substance and Sexual Activity   Alcohol use: No     Frequency: Never   Drug use: No   Sexual activity: Not on file  Lifestyle   Physical activity    Days per week: Not on file    Minutes per session: Not on file   Stress: Not on file  Relationships   Social connections    Talks on phone: Not on file    Gets together: Not on file    Attends religious service: Not on file    Active member of club or organization: Not on file    Attends meetings of  clubs or organizations: Not on file    Relationship status: Not on file   Intimate partner violence    Fear of current or ex partner: Not on file    Emotionally abused: Not on file    Physically abused: Not on file    Forced sexual activity: Not on file  Other Topics Concern   Not on file  Social History Narrative   Not on file    Family History  Problem Relation Age of Onset   Diabetes Sister    Diabetes Brother     Review of Systems:  As stated in the HPI and otherwise negative.   BP 118/63    Pulse 80    Ht 5\' 9"  (1.753 m)    Wt 175 lb (79.4 kg)    BMI 25.84 kg/m   Physical Examination:  General: Well developed, well nourished, NAD  HEENT: OP clear, mucus membranes moist  SKIN: warm, dry. No rashes. Neuro: No focal deficits  Musculoskeletal: Muscle strength 5/5 all ext  Psychiatric: Mood and affect normal  Neck: No JVD, no carotid bruits, no thyromegaly, no lymphadenopathy.  Lungs:Clear bilaterally, no wheezes, rhonci, crackles Cardiovascular: Regular rate and rhythm. No murmurs, gallops or rubs. Abdomen:Soft. Bowel sounds present. Non-tender.  Extremities: No lower extremity edema. Pulses are 2 + in the bilateral DP/PT.  Echo June 2020:   1. The left ventricle has normal systolic function with an ejection fraction of 60-65%. The cavity size was normal. Mild basal septal hypertrophy. Left ventricular diastolic function could not be evaluated secondary to atrial fibrillation. No evidence  of left ventricular regional wall motion abnormalities.  2. The right  ventricle has normal systolic function. The cavity was normal. There is no increase in right ventricular wall thickness.  3. Left atrial size was mildly dilated.  4. There is moderate mitral annular calcification present.  5. The inferior vena cava was dilated in size with >50% respiratory variability.  EKG:  EKG is not  ordered today. The ekg ordered today demonstrates   Recent Labs: 02/18/2019: B Natriuretic Peptide 204.7; Magnesium 1.8 03/07/2019: ALT 41 03/11/2019: BUN 21; Creatinine, Ser 1.44; Hemoglobin 13.5; Platelets 123; Potassium 4.6; Sodium 138   Lipid Panel Followed in primary care   Wt Readings from Last 3 Encounters:  06/05/19 175 lb (79.4 kg)  03/14/19 173 lb (78.5 kg)  03/11/19 171 lb 3.2 oz (77.7 kg)     Other studies Reviewed: Additional studies/ records that were reviewed today include: . Review of the above records demonstrates:    Assessment and Plan:   1. CAD s/p CABG with angina: He has chronic angina. Much improved since PCI/stenting of three vein grafts in in June 2020. Will continue ASA, Plavix, beta blocker, Imdur and Zetia.   2. Persistent atrial fibrillation: Atrial fibrillation today. He refuses a DOAC. Continue Toprol.     3. HTN: BP controlled. NO changes  4. HLD: He statin intolerant. He has refused to consider Praluent or Repatha. Continue Zetia.   5. Carotid artery disease: Moderate disease by dopplers January 2020. Repeat in one year.   Current medicines are reviewed at length with the patient today.  The patient does not have concerns regarding medicines.  The following changes have been made:  no change  Labs/ tests ordered today include:   No orders of the defined types were placed in this encounter.    Disposition:   FU with me in 6  months    Signed, Lauree Chandler, MD  06/05/2019 12:13 PM    [facility] [address], [place], [place]  [address] Phone: [phone]; Fax: [phone]

## 2019-06-13 ENCOUNTER — Ambulatory Visit: Payer: Medicare Other | Admitting: Cardiovascular Disease

## 2019-06-18 ENCOUNTER — Other Ambulatory Visit: Payer: Self-pay | Admitting: Cardiology

## 2019-10-01 ENCOUNTER — Ambulatory Visit (HOSPITAL_COMMUNITY)
Admission: RE | Admit: 2019-10-01 | Discharge: 2019-10-01 | Disposition: A | Discharge status: Home / Self Care | Payer: Medicare Other | Source: Ambulatory Visit | Attending: Cardiology | Admitting: Cardiology

## 2019-10-01 ENCOUNTER — Other Ambulatory Visit (HOSPITAL_COMMUNITY): Payer: Self-pay | Admitting: Physician Assistant

## 2019-10-01 ENCOUNTER — Other Ambulatory Visit: Payer: Self-pay

## 2019-10-01 DIAGNOSIS — I6522 Occlusion and stenosis of left carotid artery: Secondary | ICD-10-CM

## 2019-10-09 ENCOUNTER — Other Ambulatory Visit: Payer: Self-pay | Admitting: Physician Assistant

## 2019-11-17 ENCOUNTER — Other Ambulatory Visit: Payer: Self-pay | Admitting: Cardiology

## 2019-12-18 NOTE — Progress Notes (Signed)
Chief Complaint  Patient presents with  . Follow-up    CAD   History of Present Illness: 86 yo male with history of CAD s/p CABG, atrial fibrillation, HTN, HLD, COPD and statin intolerance here today for cardiac follow up. He had been followed by Dr. Ron Parker. He had a 5 vessel CABG in 2002 (LIMA to LAD, SVG to D1, sequential SVG to PDA and PLA, SVG to OM1)and a drug eluting stent placed in the Circumflex in 2009. 4 bypass grafts were noted to be patent by cath in 2009. The vein graft to the PDA did not supply the PLA as reported in the op report. He is known to have PAF but has refused anti-coagulation in the past. Echo January 2015 with normal LV systolic function, trivial AI, mild MR. He has been on ASA only. He has moderate carotid artery disease, stable by dopplers January 2021. He was admitted to [facility] June 2020 with a NSTEMI and had a drug eluting stent placed in the vein graft to the PDA. Also with severe disease in the vein graft to the diagonal and the vein graft to the OM with patent LIMA to LAD. He was re-admitted to [facility] 03/07/19 and had cardiac cath with placement of drug eluting stents in the vein graft to the Diagonal and in the vein graft to the OM. Echo June 2020 with normal LV systolic function and no significant valve disease.   He is here today for follow up. The patient denies any dyspnea, palpitations, lower extremity edema, orthopnea, PND, dizziness, near syncope or syncope. He has daily "heartburn" after meals. He has some exertional chest pain with moderate exertion but no change.   Primary Care Physician: Christain Sacramento, MD  Past Medical History:  Diagnosis Date  . Anxiety    mild  . Atrial fibrillation ([place])   . CAD (coronary artery disease)    DES proximal circumflex January, 2009  /   nuclear, December, 2011, no ischemia, ejection fraction 70% 02/20/19 PCI/DES to SVG-rPDA 03/10/19 PCI/DES x1 to SVG-Diag, DESx2 to SVG to OM  . Carotid artery disease ([place])    Doppler,  August, 2011, zero [id number] RIC A., [id number] LICA, stable  . Chronic headaches   . COPD (chronic obstructive pulmonary disease) ([place])   . Diabetic peripheral neuropathy ([place]) 01/22/2018  . Dyslipidemia    statin intolerance  . Ejection fraction    EF 70%, nuclear, December, 2011  . GERD (gastroesophageal reflux disease)   . Hiatal hernia   . Hx of CABG    Hendrickson, 2002  . Hypercholesterolemia   . Hypertension   . MGUS (monoclonal gammopathy of unknown significance) 01/22/2018  . Prostate cancer ([place])    TUR and implant  . Rash    2009, etiology not clear, not from Plavix, Claritin helped  . Statin intolerance     Past Surgical History:  Procedure Laterality Date  . BLADDER SURGERY    . CARDIAC SURGERY    . CORONARY STENT INTERVENTION N/A 02/19/2019   Procedure: CORONARY STENT INTERVENTION;  Surgeon: Wellington Hampshire, MD;  Location: [place] [facility];  Service: Cardiovascular;  Laterality: N/A;  . CORONARY STENT INTERVENTION N/A 03/10/2019   Procedure: CORONARY STENT INTERVENTION;  Surgeon: Burnell Blanks, MD;  Location: [place] [facility];  Service: Cardiovascular;  Laterality: N/A;  . LEFT HEART CATH AND CORS/GRAFTS ANGIOGRAPHY N/A 02/19/2019   Procedure: LEFT HEART CATH AND CORS/GRAFTS ANGIOGRAPHY;  Surgeon: Wellington Hampshire, MD;  Location: [facility]  INVASIVE CV LAB;  Service: Cardiovascular;  Laterality: N/A;  . LEFT HEART CATH AND CORS/GRAFTS ANGIOGRAPHY N/A 03/10/2019   Procedure: LEFT HEART CATH AND CORS/GRAFTS ANGIOGRAPHY;  Surgeon: Burnell Blanks, MD;  Location: [place] [facility];  Service: Cardiovascular;  Laterality: N/A;  . PROSTATE SURGERY      Current Outpatient Medications  Medication Sig Dispense Refill  . Ascorbic Acid (VITAMIN C WITH ROSE HIPS) 1000 MG tablet Take 1,000 mg by mouth daily.    Marland Kitchen aspirin EC 81 MG tablet Take 81 mg by mouth daily.    . clopidogrel (PLAVIX) 75 MG tablet TAKE 1 TABLET BY MOUTH EVERY DAY WITH BREAKFAST 90 tablet 0  . ezetimibe  (ZETIA) 10 MG tablet TAKE 1 TABLET(10 MG) BY MOUTH DAILY 30 tablet 11  . fish oil-omega-3 fatty acids 1000 MG capsule Take 1-2 g by mouth See admin instructions. Take 2 capsules (2 g) by mouth every morning and 1 capsule (1 g) every evening    . isosorbide mononitrate (IMDUR) 30 MG 24 hr tablet Take 1 tablet (30 mg total) by mouth daily. 90 tablet 3  . metoprolol succinate (TOPROL-XL) 25 MG 24 hr tablet TAKE 1/2 TABLET BY MOUTH EVERY EVENING 45 tablet 3  . nitroGLYCERIN (NITROSTAT) 0.4 MG SL tablet Place 1 tablet (0.4 mg total) under the tongue every 5 (five) minutes x 3 doses as needed for chest pain. 25 tablet 2  . triamterene-hydrochlorothiazide (DYAZIDE) 37.5-25 MG capsule TAKE 1 CAPSULE BY MOUTH DAILY FOR BLOOD PRESSURE    . vitamin B-12 (CYANOCOBALAMIN) 100 MCG tablet Take 100 mcg by mouth daily.     No current facility-administered medications for this visit.    Allergies  Allergen Reactions  . Metformin And Related Diarrhea and Other (See Comments)    flatulence  . Allopurinol Other (See Comments)    Painful skin nodules  . Percocet [Oxycodone-Acetaminophen] Other (See Comments)    Unknown reaction  . Rabeprazole Sodium Other (See Comments)    Unknown reaction  . Statins Other (See Comments)    Hmg-Coa Reductase inhibitors, Myalgias (intolerance)    Social History   Socioeconomic History  . Marital status: Married    Spouse name: Not on file  . Number of children: Not on file  . Years of education: Not on file  . Highest education level: Not on file  Occupational History  . Not on file  Tobacco Use  . Smoking status: Never Smoker  . Smokeless tobacco: Never Used  Substance and Sexual Activity  . Alcohol use: No  . Drug use: No  . Sexual activity: Not on file  Other Topics Concern  . Not on file  Social History Narrative  . Not on file   Social Determinants of Health   Financial Resource Strain:   . Difficulty of Paying Living Expenses:   Food Insecurity:     . Worried About Charity fundraiser in the Last Year:   . Arboriculturist in the Last Year:   Transportation Needs:   . Film/video editor (Medical):   Marland Kitchen Lack of Transportation (Non-Medical):   Physical Activity:   . Days of Exercise per Week:   . Minutes of Exercise per Session:   Stress:   . Feeling of Stress :   Social Connections:   . Frequency of Communication with Friends and Family:   . Frequency of Social Gatherings with Friends and Family:   . Attends Religious Services:   . Active Member  of Clubs or Organizations:   . Attends Archivist Meetings:   Marland Kitchen Marital Status:   Intimate Partner Violence:   . Fear of Current or Ex-Partner:   . Emotionally Abused:   Marland Kitchen Physically Abused:   . Sexually Abused:     Family History  Problem Relation Age of Onset  . Diabetes Sister   . Diabetes Brother     Review of Systems:  As stated in the HPI and otherwise negative.   BP 132/66   Pulse 74   Ht 5\' 9"  (1.753 m)   Wt 182 lb (82.6 kg)   SpO2 99%   BMI 26.88 kg/m   Physical Examination:  General: Well developed, well nourished, NAD  HEENT: OP clear, mucus membranes moist  SKIN: warm, dry. No rashes. Neuro: No focal deficits  Musculoskeletal: Muscle strength 5/5 all ext  Psychiatric: Mood and affect normal  Neck: No JVD, no carotid bruits, no thyromegaly, no lymphadenopathy.  Lungs:Clear bilaterally, no wheezes, rhonci, crackles Cardiovascular: Regular rate and rhythm. No murmurs, gallops or rubs. Abdomen:Soft. Bowel sounds present. Non-tender.  Extremities: No lower extremity edema. Pulses are 2 + in the bilateral DP/PT.  Echo June 2020:   1. The left ventricle has normal systolic function with an ejection fraction of 60-65%. The cavity size was normal. Mild basal septal hypertrophy. Left ventricular diastolic function could not be evaluated secondary to atrial fibrillation. No evidence  of left ventricular regional wall motion abnormalities.  2. The  right ventricle has normal systolic function. The cavity was normal. There is no increase in right ventricular wall thickness.  3. Left atrial size was mildly dilated.  4. There is moderate mitral annular calcification present.  5. The inferior vena cava was dilated in size with >50% respiratory variability.  EKG:  EKG is  ordered today. The ekg ordered today demonstrates atrial fib, rate 74 bpm  Recent Labs: 02/18/2019: B Natriuretic Peptide 204.7; Magnesium 1.8 03/07/2019: ALT 41 03/11/2019: BUN 21; Creatinine, Ser 1.44; Hemoglobin 13.5; Platelets 123; Potassium 4.6; Sodium 138   Lipid Panel Followed in primary care   Wt Readings from Last 3 Encounters:  12/19/19 182 lb (82.6 kg)  06/05/19 175 lb (79.4 kg)  03/14/19 173 lb (78.5 kg)     Other studies Reviewed: Additional studies/ records that were reviewed today include: . Review of the above records demonstrates:    Assessment and Plan:   1. CAD s/p CABG with angina: Chronic, stable chest pains. Continue ASA, Plavix, Imdur, beta blocker and Zetia.    2. Persistent atrial fibrillation: Rate controlled atrial fib today. He refuses a DOAC. Continue beta blocker.      3. HTN: BP controlled.   4. HLD: He statin intolerant. He has refused to consider Praluent or Repatha. Will continue Zetia.   5. Carotid artery disease: Moderate disease by dopplers January 2021. Repeat in 2022.   6. GERD/Hiatal hernia: he did not have control of symptoms on Protonix. Will restart Prevacid now that he is 9 months out from his stents (he is on Plavix). I suspect that his chest pain is a combination of his acid reflux and chronic stable angina.   Current medicines are reviewed at length with the patient today.  The patient does not have concerns regarding medicines.  The following changes have been made:  no change  Labs/ tests ordered today include:   Orders Placed This Encounter  Procedures  . EKG 12-Lead     Disposition:   FU with  me  in 6  months    Signed, Lauree Chandler, MD 12/19/2019 9:26 AM    [facility] [place], [place], [place]  [address] Phone: [phone]; Fax: [phone]

## 2019-12-19 ENCOUNTER — Other Ambulatory Visit: Payer: Self-pay

## 2019-12-19 ENCOUNTER — Encounter: Payer: Self-pay | Admitting: Cardiovascular Disease

## 2019-12-19 ENCOUNTER — Ambulatory Visit: Payer: Medicare Other | Admitting: Cardiovascular Disease

## 2019-12-19 VITALS — BP 132/66 | HR 74 | Ht 69.0 in | Wt 182.0 lb

## 2019-12-19 DIAGNOSIS — I4819 Other persistent atrial fibrillation: Secondary | ICD-10-CM

## 2019-12-19 DIAGNOSIS — E785 Hyperlipidemia, unspecified: Secondary | ICD-10-CM | POA: Diagnosis not present

## 2019-12-19 DIAGNOSIS — I2510 Atherosclerotic heart disease of native coronary artery without angina pectoris: Secondary | ICD-10-CM

## 2019-12-19 DIAGNOSIS — I10 Essential (primary) hypertension: Secondary | ICD-10-CM | POA: Diagnosis not present

## 2019-12-19 DIAGNOSIS — I779 Disorder of arteries and arterioles, unspecified: Secondary | ICD-10-CM

## 2019-12-19 NOTE — Patient Instructions (Signed)
Medication Instructions:  Your physician recommends that you continue on your current medications as directed. Please refer to the Current Medication list given to you today.  *If you need a refill on your cardiac medications before your next appointment, please call your pharmacy*   Lab Work: None Ordered If you have labs (blood work) drawn today and your tests are completely normal, you will receive your results only by: Marland Kitchen MyChart Message (if you have MyChart) OR . A paper copy in the mail If you have any lab test that is abnormal or we need to change your treatment, we will call you to review the results.   Testing/Procedures: None Ordered   Follow-Up: At [facility], you and your health needs are our priority.  As part of our continuing mission to provide you with exceptional heart care, we have created designated Provider Care Teams.  These Care Teams include your primary Cardiologist (physician) and Advanced Practice Providers (APPs -  Physician Assistants and Nurse Practitioners) who all work together to provide you with the care you need, when you need it.  We recommend signing up for the patient portal called "MyChart".  Sign up information is provided on this After Visit Summary.  MyChart is used to connect with patients for Virtual Visits (Telemedicine).  Patients are able to view lab/test results, encounter notes, upcoming appointments, etc.  Non-urgent messages can be sent to your provider as well.   To learn more about what you can do with MyChart, go to [email or web address].    Your next appointment:   6 month(s)  The format for your next appointment:   Either In Person or Virtual  Provider:   You may see Lauree Chandler, MD or one of the following Advanced Practice Providers on your designated Care Team:    Melina Copa, PA-C  Ermalinda Barrios, PA-C

## 2020-02-14 ENCOUNTER — Other Ambulatory Visit: Payer: Self-pay | Admitting: Cardiology

## 2020-07-05 ENCOUNTER — Encounter: Payer: Self-pay | Admitting: Cardiovascular Disease

## 2020-07-05 ENCOUNTER — Ambulatory Visit: Payer: Medicare Other | Admitting: Cardiovascular Disease

## 2020-07-05 ENCOUNTER — Other Ambulatory Visit: Payer: Self-pay

## 2020-07-05 VITALS — BP 136/80 | HR 81 | Ht 69.0 in | Wt 182.0 lb

## 2020-07-05 DIAGNOSIS — I10 Essential (primary) hypertension: Secondary | ICD-10-CM | POA: Diagnosis not present

## 2020-07-05 DIAGNOSIS — I25119 Atherosclerotic heart disease of native coronary artery with unspecified angina pectoris: Secondary | ICD-10-CM

## 2020-07-05 DIAGNOSIS — E785 Hyperlipidemia, unspecified: Secondary | ICD-10-CM

## 2020-07-05 DIAGNOSIS — I4819 Other persistent atrial fibrillation: Secondary | ICD-10-CM

## 2020-07-05 DIAGNOSIS — I779 Disorder of arteries and arterioles, unspecified: Secondary | ICD-10-CM

## 2020-07-05 NOTE — Progress Notes (Signed)
Chief Complaint  Patient presents with  . Follow-up    CAD   History of Present Illness: 86 yo male with history of CAD s/p CABG, atrial fibrillation, HTN, HLD, COPD and statin intolerance here today for cardiac follow up. He had a 5 vessel CABG in 2002 (LIMA to LAD, SVG to D1, sequential SVG to PDA and PLA, SVG to OM1)and a drug eluting stent placed in the Circumflex in 2009. 4 bypass grafts were noted to be patent by cath in 2009. The vein graft to the PDA did not supply the PLA as reported in the op report. He is known to have PAF but has refused anti-coagulation in the past. Echo January 2015 with normal LV systolic function, trivial AI, mild MR. He has been on ASA only. He has moderate carotid artery disease, stable by dopplers January 2021. He was admitted to [facility] June 2020 with a NSTEMI and had a drug eluting stent placed in the vein graft to the PDA. Also with severe disease in the vein graft to the diagonal and the vein graft to the OM with patent LIMA to LAD. He was re-admitted to [facility] 03/07/19 and had cardiac cath with placement of drug eluting stents in the vein graft to the Diagonal and in the vein graft to the OM. Echo June 2020 with normal LV systolic function and no significant valve disease.   He is here today for follow up. The patient denies any chest pain, dyspnea, palpitations, lower extremity edema, orthopnea, PND, dizziness, near syncope or syncope. He is doing well. He is very active. Only has chest pain after meals. No exertional chest pain.   Primary Care Physician: Christain Sacramento, MD  Past Medical History:  Diagnosis Date  . Anxiety    mild  . Atrial fibrillation ([place])   . CAD (coronary artery disease)    DES proximal circumflex January, 2009  /   nuclear, December, 2011, no ischemia, ejection fraction 70% 02/20/19 PCI/DES to SVG-rPDA 03/10/19 PCI/DES x1 to SVG-Diag, DESx2 to SVG to OM  . Carotid artery disease ([place])    Doppler, August, 2011, zero 39% RIC A., 67-89%  LICA, stable  . Chronic headaches   . COPD (chronic obstructive pulmonary disease) ([place])   . Diabetic peripheral neuropathy ([place]) 01/22/2018  . Dyslipidemia    statin intolerance  . Ejection fraction    EF 70%, nuclear, December, 2011  . GERD (gastroesophageal reflux disease)   . Hiatal hernia   . Hx of CABG    Hendrickson, 2002  . Hypercholesterolemia   . Hypertension   . MGUS (monoclonal gammopathy of unknown significance) 01/22/2018  . Prostate cancer ([place])    TUR and implant  . Rash    2009, etiology not clear, not from Plavix, Claritin helped  . Statin intolerance     Past Surgical History:  Procedure Laterality Date  . BLADDER SURGERY    . CARDIAC SURGERY    . CORONARY STENT INTERVENTION N/A 02/19/2019   Procedure: CORONARY STENT INTERVENTION;  Surgeon: Wellington Hampshire, MD;  Location: [place] [facility];  Service: Cardiovascular;  Laterality: N/A;  . CORONARY STENT INTERVENTION N/A 03/10/2019   Procedure: CORONARY STENT INTERVENTION;  Surgeon: Burnell Blanks, MD;  Location: [place] [facility];  Service: Cardiovascular;  Laterality: N/A;  . LEFT HEART CATH AND CORS/GRAFTS ANGIOGRAPHY N/A 02/19/2019   Procedure: LEFT HEART CATH AND CORS/GRAFTS ANGIOGRAPHY;  Surgeon: Wellington Hampshire, MD;  Location: [place] [facility];  Service:  Cardiovascular;  Laterality: N/A;  . LEFT HEART CATH AND CORS/GRAFTS ANGIOGRAPHY N/A 03/10/2019   Procedure: LEFT HEART CATH AND CORS/GRAFTS ANGIOGRAPHY;  Surgeon: Burnell Blanks, MD;  Location: [place] [facility];  Service: Cardiovascular;  Laterality: N/A;  . PROSTATE SURGERY      Current Outpatient Medications  Medication Sig Dispense Refill  . Ascorbic Acid (VITAMIN C WITH ROSE HIPS) 1000 MG tablet Take 1,000 mg by mouth daily.    Marland Kitchen aspirin EC 81 MG tablet Take 81 mg by mouth daily.    . clopidogrel (PLAVIX) 75 MG tablet TAKE 1 TABLET BY MOUTH EVERY DAY WITH BREAKFAST 90 tablet 3  . ezetimibe (ZETIA) 10 MG tablet TAKE 1 TABLET(10  MG) BY MOUTH DAILY 30 tablet 11  . fish oil-omega-3 fatty acids 1000 MG capsule Take 1-2 g by mouth See admin instructions. Take 2 capsules (2 g) by mouth every morning and 1 capsule (1 g) every evening    . nitroGLYCERIN (NITROSTAT) 0.4 MG SL tablet Place 1 tablet (0.4 mg total) under the tongue every 5 (five) minutes x 3 doses as needed for chest pain. 25 tablet 2  . triamterene-hydrochlorothiazide (DYAZIDE) 37.5-25 MG capsule TAKE 1 CAPSULE BY MOUTH DAILY FOR BLOOD PRESSURE    . vitamin B-12 (CYANOCOBALAMIN) 100 MCG tablet Take 100 mcg by mouth daily.     No current facility-administered medications for this visit.    Allergies  Allergen Reactions  . Metformin And Related Diarrhea and Other (See Comments)    flatulence  . Allopurinol Other (See Comments)    Painful skin nodules  . Percocet [Oxycodone-Acetaminophen] Other (See Comments)    Unknown reaction  . Rabeprazole Sodium Other (See Comments)    Unknown reaction  . Statins Other (See Comments)    Hmg-Coa Reductase inhibitors, Myalgias (intolerance)    Social History   Socioeconomic History  . Marital status: Married    Spouse name: Not on file  . Number of children: Not on file  . Years of education: Not on file  . Highest education level: Not on file  Occupational History  . Not on file  Tobacco Use  . Smoking status: Never Smoker  . Smokeless tobacco: Never Used  Vaping Use  . Vaping Use: Never used  Substance and Sexual Activity  . Alcohol use: No  . Drug use: No  . Sexual activity: Not on file  Other Topics Concern  . Not on file  Social History Narrative  . Not on file   Social Determinants of Health   Financial Resource Strain:   . Difficulty of Paying Living Expenses: Not on file  Food Insecurity:   . Worried About Charity fundraiser in the Last Year: Not on file  . Ran Out of Food in the Last Year: Not on file  Transportation Needs:   . Lack of Transportation (Medical): Not on file  . Lack of  Transportation (Non-Medical): Not on file  Physical Activity:   . Days of Exercise per Week: Not on file  . Minutes of Exercise per Session: Not on file  Stress:   . Feeling of Stress : Not on file  Social Connections:   . Frequency of Communication with Friends and Family: Not on file  . Frequency of Social Gatherings with Friends and Family: Not on file  . Attends Religious Services: Not on file  . Active Member of Clubs or Organizations: Not on file  . Attends Archivist Meetings: Not on file  .  Marital Status: Not on file  Intimate Partner Violence:   . Fear of Current or Ex-Partner: Not on file  . Emotionally Abused: Not on file  . Physically Abused: Not on file  . Sexually Abused: Not on file    Family History  Problem Relation Age of Onset  . Diabetes Sister   . Diabetes Brother     Review of Systems:  As stated in the HPI and otherwise negative.   BP 136/80   Pulse 81   Ht 5\' 9"  (1.753 m)   Wt 182 lb (82.6 kg)   SpO2 99%   BMI 26.88 kg/m   Physical Examination:  General: Well developed, well nourished, NAD  HEENT: OP clear, mucus membranes moist  SKIN: warm, dry. No rashes. Neuro: No focal deficits  Musculoskeletal: Muscle strength 5/5 all ext  Psychiatric: Mood and affect normal  Neck: No JVD, no carotid bruits, no thyromegaly, no lymphadenopathy.  Lungs:Clear bilaterally, no wheezes, rhonci, crackles Cardiovascular: Regular rate and rhythm. No murmurs, gallops or rubs. Abdomen:Soft. Bowel sounds present. Non-tender.  Extremities: No lower extremity edema. Pulses are 2 + in the bilateral DP/PT.  Echo June 2020:   1. The left ventricle has normal systolic function with an ejection fraction of 60-65%. The cavity size was normal. Mild basal septal hypertrophy. Left ventricular diastolic function could not be evaluated secondary to atrial fibrillation. No evidence  of left ventricular regional wall motion abnormalities.  2. The right ventricle has  normal systolic function. The cavity was normal. There is no increase in right ventricular wall thickness.  3. Left atrial size was mildly dilated.  4. There is moderate mitral annular calcification present.  5. The inferior vena cava was dilated in size with >50% respiratory variability.  EKG:  EKG is  ordered today. The ekg ordered today demonstrates atrial fib, rate 74 bpm  Recent Labs: No results found for requested labs within last 8760 hours.   Lipid Panel Followed in primary care   Wt Readings from Last 3 Encounters:  07/05/20 182 lb (82.6 kg)  12/19/19 182 lb (82.6 kg)  06/05/19 175 lb (79.4 kg)     Other studies Reviewed: Additional studies/ records that were reviewed today include: . Review of the above records demonstrates:    Assessment and Plan:   1. CAD s/p CABG with angina: He has chronic chest pains. No recent change. Will continue ASA, Plavix and Zetia.   2. Persistent atrial fibrillation: He is in atrial fib today by exam. His heart rate is controlled. He refuses a DOAC. Continue beta blocker.      3. HTN: BP is controlled. Continue current therapy  4. HLD: He statin intolerant. He has refused to consider Praluent or Repatha. Continue Zetia  5. Carotid artery disease: Moderate disease by dopplers January 2021. He does not wish to repeat given his age.   Current medicines are reviewed at length with the patient today.  The patient does not have concerns regarding medicines.  The following changes have been made:  no change  Labs/ tests ordered today include:   No orders of the defined types were placed in this encounter.    Disposition:   FU with me in 12  months    Signed, Lauree Chandler, MD 07/05/2020 2:49 PM    [address], [place],   [address] Phone: [phone]; Fax: [phone]

## 2020-07-05 NOTE — Patient Instructions (Addendum)
Medication Instructions:  No changes--ok to remain off Toprol XL and isosorbide  *If you need a refill on your cardiac medications before your next appointment, please call your pharmacy*   Lab Work: none If you have labs (blood work) drawn today and your tests are completely normal, you will receive your results only by: Marland Kitchen MyChart Message (if you have MyChart) OR . A paper copy in the mail If you have any lab test that is abnormal or we need to change your treatment, we will call you to review the results.   Testing/Procedures: none   Follow-Up: At [facility], you and your health needs are our priority.  As part of our continuing mission to provide you with exceptional heart care, we have created designated Provider Care Teams.  These Care Teams include your primary Cardiologist (physician) and Advanced Practice Providers (APPs -  Physician Assistants and Nurse Practitioners) who all work together to provide you with the care you need, when you need it.  We recommend signing up for the patient portal called "MyChart".  Sign up information is provided on this After Visit Summary.  MyChart is used to connect with patients for Virtual Visits (Telemedicine).  Patients are able to view lab/test results, encounter notes, upcoming appointments, etc.  Non-urgent messages can be sent to your provider as well.   To learn more about what you can do with MyChart, go to [email or web address].    Your next appointment:   12 month(s)  The format for your next appointment:   In Person  Provider:   Lauree Chandler, MD   Other Instructions

## 2020-09-20 ENCOUNTER — Ambulatory Visit: Payer: Medicare Other | Admitting: Cardiovascular Disease

## 2020-09-30 ENCOUNTER — Other Ambulatory Visit (HOSPITAL_COMMUNITY): Payer: Self-pay | Admitting: Physician Assistant

## 2020-09-30 ENCOUNTER — Ambulatory Visit (HOSPITAL_COMMUNITY)
Admission: RE | Admit: 2020-09-30 | Discharge: 2020-09-30 | Disposition: A | Discharge status: Home / Self Care | Payer: Medicare Other | Source: Ambulatory Visit | Attending: Cardiology | Admitting: Cardiology

## 2020-09-30 ENCOUNTER — Other Ambulatory Visit: Payer: Self-pay

## 2020-09-30 DIAGNOSIS — I6523 Occlusion and stenosis of bilateral carotid arteries: Secondary | ICD-10-CM

## 2020-09-30 DIAGNOSIS — I6522 Occlusion and stenosis of left carotid artery: Secondary | ICD-10-CM | POA: Diagnosis not present

## 2020-10-15 ENCOUNTER — Other Ambulatory Visit: Payer: Self-pay | Admitting: Physician Assistant

## 2020-12-17 IMAGING — DX PORTABLE CHEST - 1 VIEW
1 series · 1 of 1 positions shown · non-contrast
Comparison: October 01, 2007

CLINICAL DATA: Chest pain

EXAM:
PORTABLE CHEST 1 VIEW

[chest ap]
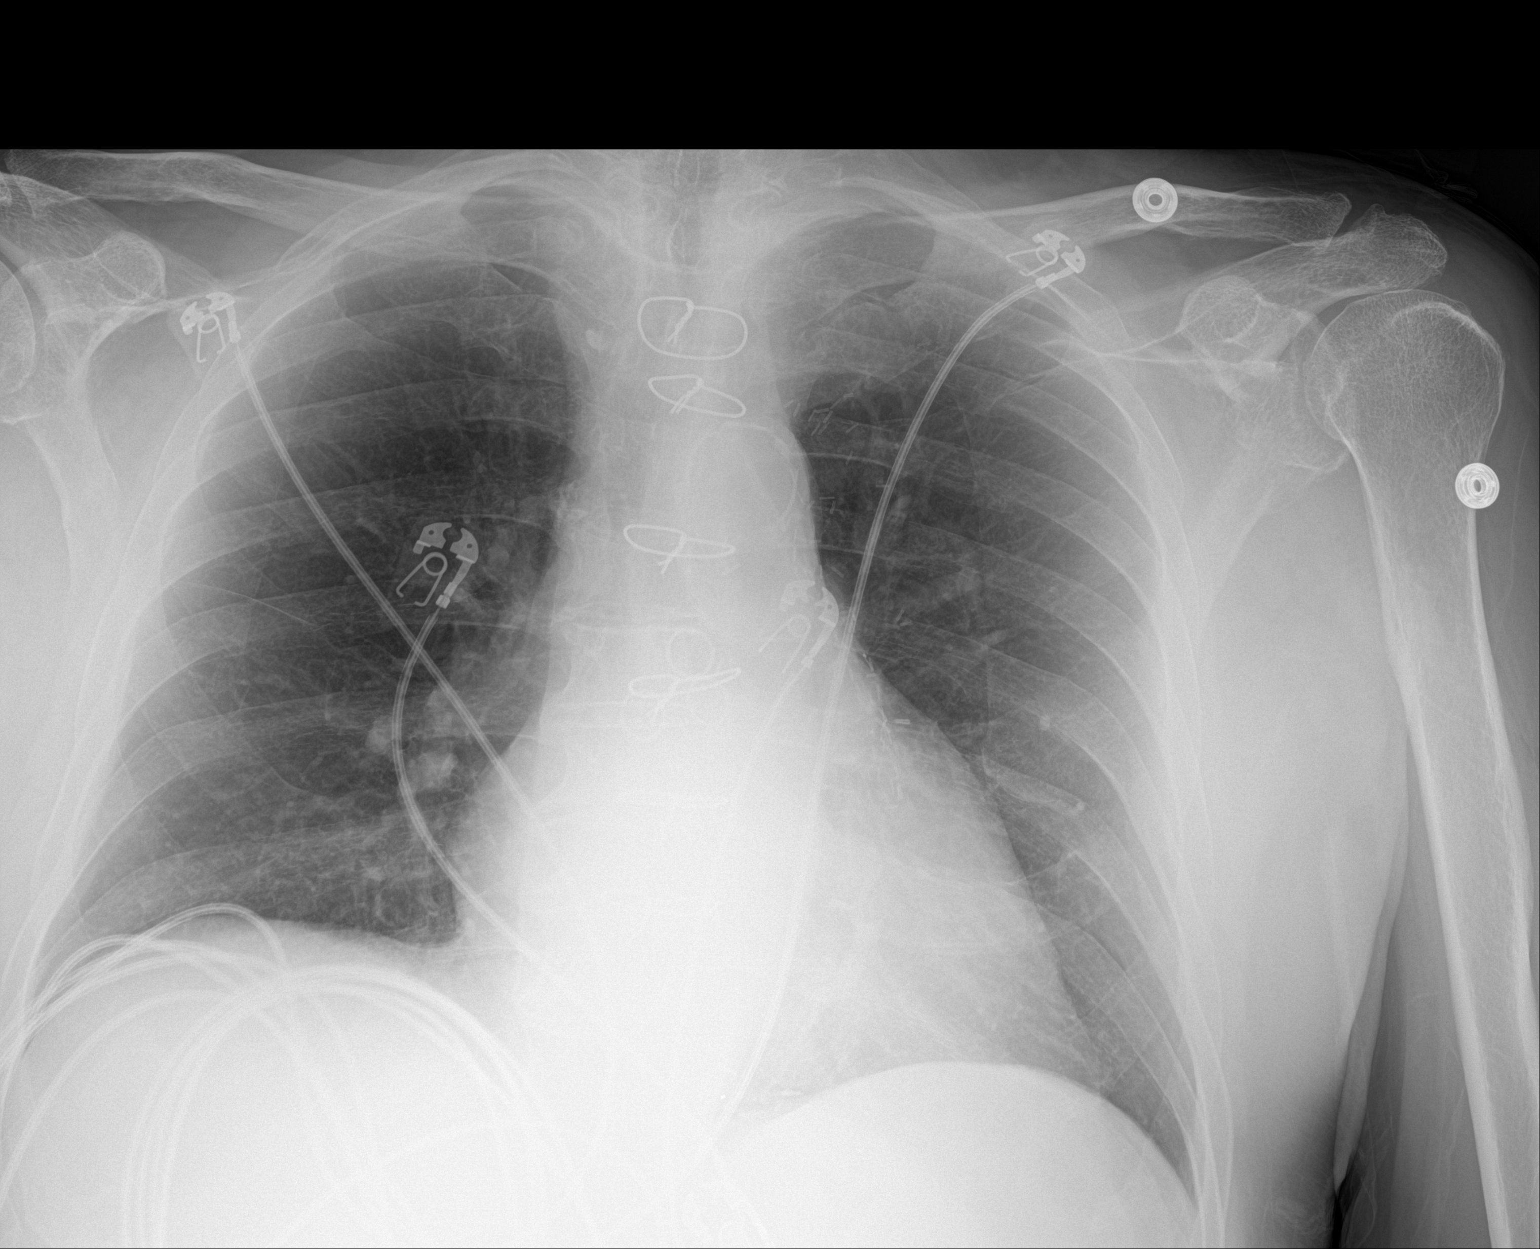

[1 of 1 positions shown; findings below may reference images not displayed]

FINDINGS: No edema or consolidation. Heart is upper normal in size with
pulmonary vascularity normal. Patient is status post coronary artery
bypass grafting. There is aortic atherosclerosis. No adenopathy. No
bone lesions.
IMPRESSION: No edema or consolidation. Heart upper normal in size. Postoperative
changes noted. Aortic Atherosclerosis ([id number]).

## 2021-02-11 ENCOUNTER — Other Ambulatory Visit: Payer: Self-pay | Admitting: Cardiovascular Disease

## 2021-08-05 ENCOUNTER — Other Ambulatory Visit: Payer: Self-pay | Admitting: Cardiovascular Disease

## 2021-09-27 NOTE — Progress Notes (Signed)
Chief Complaint  Patient presents with   Follow-up    CAD   History of Present Illness: 88 yo male with history of CAD s/p CABG, atrial fibrillation, HTN, HLD, COPD, carotid artery disease and statin intolerance here today for cardiac follow up. He had a 5 vessel CABG in 2002 (LIMA to LAD, SVG to D1, sequential SVG to PDA and PLA, SVG to OM1)and a drug eluting stent placed in the Circumflex in 2009. 4 bypass grafts were noted to be patent by cath in 2009. The vein graft to the PDA did not supply the PLA as reported in the op report. He is known to have PAF but has refused anti-coagulation in the past. Echo January 2015 with normal LV systolic function, trivial AI, mild MR. He has been on ASA only. He has moderate carotid artery disease, stable by dopplers January 2022. He was admitted to [facility] June 2020 with a NSTEMI and had a drug eluting stent placed in the vein graft to the PDA. Also with severe disease in the vein graft to the diagonal and the vein graft to the OM with patent LIMA to LAD. He was re-admitted to [facility] 03/07/19 and had cardiac cath with placement of drug eluting stents in the vein graft to the Diagonal and in the vein graft to the OM. Echo June 2020 with normal LV systolic function and no significant valve disease.   He is here today for follow up. The patient denies any chest pain, dyspnea, palpitations, lower extremity edema, orthopnea, PND, dizziness, near syncope or syncope.   Primary Care Physician: Christain Sacramento, MD  Past Medical History:  Diagnosis Date   Anxiety    mild   Atrial fibrillation [facility])    CAD (coronary artery disease)    DES proximal circumflex January, 2009  /   nuclear, December, 2011, no ischemia, ejection fraction 70% 02/20/19 PCI/DES to SVG-rPDA 03/10/19 PCI/DES x1 to SVG-Diag, DESx2 to SVG to OM   Carotid artery disease ([place])    Doppler, August, 2011, zero 12% RIC A., 87-86% LICA, stable   Chronic headaches    COPD (chronic obstructive pulmonary  disease) ([place])    Diabetic peripheral neuropathy ([place]) 01/22/2018   Dyslipidemia    statin intolerance   Ejection fraction    EF 70%, nuclear, December, 2011   GERD (gastroesophageal reflux disease)    Hiatal hernia    Hx of CABG    Hendrickson, 2002   Hypercholesterolemia    Hypertension    MGUS (monoclonal gammopathy of unknown significance) 01/22/2018   Prostate cancer ([place])    TUR and implant   Rash    2009, etiology not clear, not from Plavix, Claritin helped   Statin intolerance     Past Surgical History:  Procedure Laterality Date   BLADDER SURGERY     CARDIAC SURGERY     CORONARY STENT INTERVENTION N/A 02/19/2019   Procedure: CORONARY STENT INTERVENTION;  Surgeon: Wellington Hampshire, MD;  Location: [place] [facility];  Service: Cardiovascular;  Laterality: N/A;   CORONARY STENT INTERVENTION N/A 03/10/2019   Procedure: CORONARY STENT INTERVENTION;  Surgeon: Burnell Blanks, MD;  Location: [place] [facility];  Service: Cardiovascular;  Laterality: N/A;   LEFT HEART CATH AND CORS/GRAFTS ANGIOGRAPHY N/A 02/19/2019   Procedure: LEFT HEART CATH AND CORS/GRAFTS ANGIOGRAPHY;  Surgeon: Wellington Hampshire, MD;  Location: [place] [facility];  Service: Cardiovascular;  Laterality: N/A;   LEFT HEART CATH AND CORS/GRAFTS ANGIOGRAPHY N/A 03/10/2019  Procedure: LEFT HEART CATH AND CORS/GRAFTS ANGIOGRAPHY;  Surgeon: Burnell Blanks, MD;  Location: [place] [facility];  Service: Cardiovascular;  Laterality: N/A;   PROSTATE SURGERY      Current Outpatient Medications  Medication Sig Dispense Refill   Ascorbic Acid (VITAMIN C WITH ROSE HIPS) 1000 MG tablet Take 1,000 mg by mouth daily.     aspirin EC 81 MG tablet Take 81 mg by mouth daily.     clopidogrel (PLAVIX) 75 MG tablet TAKE 1 TABLET BY MOUTH EVERY DAY WITH BREAKFAST 90 tablet 2   ezetimibe (ZETIA) 10 MG tablet TAKE 1 TABLET(10 MG) BY MOUTH DAILY 90 tablet 0   fish oil-omega-3 fatty acids 1000 MG capsule Take 1-2 g by mouth  See admin instructions. Take 2 capsules (2 g) by mouth every morning and 1 capsule (1 g) every evening     nitroGLYCERIN (NITROSTAT) 0.4 MG SL tablet Place 1 tablet (0.4 mg total) under the tongue every 5 (five) minutes x 3 doses as needed for chest pain. 25 tablet 2   triamterene-hydrochlorothiazide (DYAZIDE) 37.5-25 MG capsule TAKE 1 CAPSULE BY MOUTH DAILY FOR BLOOD PRESSURE     vitamin B-12 (CYANOCOBALAMIN) 100 MCG tablet Take 100 mcg by mouth daily.     No current facility-administered medications for this visit.    Allergies  Allergen Reactions   Metformin And Related Diarrhea and Other (See Comments)    flatulence   Allopurinol Other (See Comments)    Painful skin nodules   Percocet [Oxycodone-Acetaminophen] Other (See Comments)    Unknown reaction   Rabeprazole Sodium Other (See Comments)    Unknown reaction   Statins Other (See Comments)    Hmg-Coa Reductase inhibitors, Myalgias (intolerance)    Social History   Socioeconomic History   Marital status: Married    Spouse name: Not on file   Number of children: Not on file   Years of education: Not on file   Highest education level: Not on file  Occupational History   Not on file  Tobacco Use   Smoking status: Never   Smokeless tobacco: Never  Vaping Use   Vaping Use: Never used  Substance and Sexual Activity   Alcohol use: No   Drug use: No   Sexual activity: Not on file  Other Topics Concern   Not on file  Social History Narrative   Not on file   Social Determinants of Health   Financial Resource Strain: Not on file  Food Insecurity: Not on file  Transportation Needs: Not on file  Physical Activity: Not on file  Stress: Not on file  Social Connections: Not on file  Intimate Partner Violence: Not on file    Family History  Problem Relation Age of Onset   Diabetes Sister    Diabetes Brother     Review of Systems:  As stated in the HPI and otherwise negative.   BP 132/78    Pulse 85    Ht 5\' 9"   (1.753 m)    Wt 178 lb (80.7 kg)    SpO2 98%    BMI 26.29 kg/m   Physical Examination:  General: Well developed, well nourished, NAD  HEENT: OP clear, mucus membranes moist  SKIN: warm, dry. No rashes. Neuro: No focal deficits  Musculoskeletal: Muscle strength 5/5 all ext  Psychiatric: Mood and affect normal  Neck: No JVD, no carotid bruits, no thyromegaly, no lymphadenopathy.  Lungs:Clear bilaterally, no wheezes, rhonci, crackles Cardiovascular: Regular rate and rhythm. No murmurs,  gallops or rubs. Abdomen:Soft. Bowel sounds present. Non-tender.  Extremities: No lower extremity edema. Pulses are 2 + in the bilateral DP/PT.  Echo June 2020:  1. The left ventricle has normal systolic function with an ejection fraction of 60-65%. The cavity size was normal. Mild basal septal hypertrophy. Left ventricular diastolic function could not be evaluated secondary to atrial fibrillation. No evidence  of left ventricular regional wall motion abnormalities.  2. The right ventricle has normal systolic function. The cavity was normal. There is no increase in right ventricular wall thickness.  3. Left atrial size was mildly dilated.  4. There is moderate mitral annular calcification present.  5. The inferior vena cava was dilated in size with >50% respiratory variability.    EKG:  EKG is  ordered today. The ekg ordered today demonstrates atrial fib 85 bpm  Recent Labs: No results found for requested labs within last 8760 hours.   Lipid Panel Followed in primary care   Wt Readings from Last 3 Encounters:  09/28/21 178 lb (80.7 kg)  07/05/20 182 lb (82.6 kg)  12/19/19 182 lb (82.6 kg)     Other studies Reviewed: Additional studies/ records that were reviewed today include: . Review of the above records demonstrates:    Assessment and Plan:   1. CAD s/p CABG with angina: No change in chronic chest pain. No recent change. Continue ASA, Plavix and Zetia.   2. Persistent atrial  fibrillation: Atrial fib today. His heart rate is controlled. He refuses a DOAC. Will continue beta blocker   3. HTN: BP is well controlled. No changes  4. HLD: He statin intolerant. He has refused to consider Praluent or Repatha. Will continue Zetia.   5. Carotid artery disease: Moderate disease by dopplers January 2022. Repeat this week.   Current medicines are reviewed at length with the patient today.  The patient does not have concerns regarding medicines.  The following changes have been made:  no change  Labs/ tests ordered today include:   Orders Placed This Encounter  Procedures   EKG 12-Lead     Disposition:   FU with me in 12  months    Signed, Lauree Chandler, MD 09/28/2021 10:34 AM    [facility] [place], [place], [place]  [address] Phone: [phone]; Fax: [phone]

## 2021-09-28 ENCOUNTER — Other Ambulatory Visit: Payer: Self-pay

## 2021-09-28 ENCOUNTER — Encounter (INDEPENDENT_AMBULATORY_CARE_PROVIDER_SITE_OTHER): Payer: Self-pay

## 2021-09-28 ENCOUNTER — Ambulatory Visit: Payer: Medicare Other | Admitting: Cardiovascular Disease

## 2021-09-28 ENCOUNTER — Encounter: Payer: Self-pay | Admitting: Cardiovascular Disease

## 2021-09-28 ENCOUNTER — Encounter: Payer: Self-pay | Admitting: *Deleted

## 2021-09-28 VITALS — BP 132/78 | HR 85 | Ht 69.0 in | Wt 178.0 lb

## 2021-09-28 DIAGNOSIS — E785 Hyperlipidemia, unspecified: Secondary | ICD-10-CM

## 2021-09-28 DIAGNOSIS — I25119 Atherosclerotic heart disease of native coronary artery with unspecified angina pectoris: Secondary | ICD-10-CM

## 2021-09-28 DIAGNOSIS — I4819 Other persistent atrial fibrillation: Secondary | ICD-10-CM | POA: Diagnosis not present

## 2021-09-28 DIAGNOSIS — I10 Essential (primary) hypertension: Secondary | ICD-10-CM

## 2021-09-28 DIAGNOSIS — I779 Disorder of arteries and arterioles, unspecified: Secondary | ICD-10-CM

## 2021-09-28 NOTE — Patient Instructions (Signed)
Medication Instructions:  No changes *If you need a refill on your cardiac medications before your next appointment, please call your pharmacy*   Lab Work: none   Testing/Procedures: none   Follow-Up: At [facility], you and your health needs are our priority.  As part of our continuing mission to provide you with exceptional heart care, we have created designated Provider Care Teams.  These Care Teams include your primary Cardiologist (physician) and Advanced Practice Providers (APPs -  Physician Assistants and Nurse Practitioners) who all work together to provide you with the care you need, when you need it.  We recommend signing up for the patient portal called "MyChart".  Sign up information is provided on this After Visit Summary.  MyChart is used to connect with patients for Virtual Visits (Telemedicine).  Patients are able to view lab/test results, encounter notes, upcoming appointments, etc.  Non-urgent messages can be sent to your provider as well.   To learn more about what you can do with MyChart, go to [email or web address].    Your next appointment:   12 month(s)  The format for your next appointment:   In Person  Provider:   Lauree Chandler, MD     Other Instructions

## 2021-09-30 ENCOUNTER — Other Ambulatory Visit: Payer: Self-pay

## 2021-09-30 ENCOUNTER — Ambulatory Visit (HOSPITAL_COMMUNITY)
Admission: RE | Admit: 2021-09-30 | Discharge: 2021-09-30 | Disposition: A | Discharge status: Home / Self Care | Payer: Medicare Other | Source: Ambulatory Visit | Attending: Cardiology | Admitting: Cardiology

## 2021-09-30 DIAGNOSIS — I6523 Occlusion and stenosis of bilateral carotid arteries: Secondary | ICD-10-CM | POA: Diagnosis present

## 2021-11-03 ENCOUNTER — Other Ambulatory Visit: Payer: Self-pay | Admitting: Cardiovascular Disease

## 2022-04-07 ENCOUNTER — Telehealth: Payer: Self-pay | Admitting: Cardiovascular Disease

## 2022-04-07 NOTE — Telephone Encounter (Signed)
Pt c/o of Chest Pain: STAT if CP now or developed within 24 hours  1. Are you having CP right now? No  2. Are you experiencing any other symptoms (ex. SOB, nausea, vomiting, sweating)? SOB when he feels the pain  3. How long have you been experiencing CP? Off and on for weeks  4. Is your CP continuous or coming and going? Coming and going  5. Have you taken Nitroglycerin? Yes, on 07/20  Pt states this feels like a burning sensation creating extreme pain. He assumed it was his hiatal hernia, but his son pointed out that when he takes his Nitroglycerin, he feels that his chest relaxes so it can not be hiatal hernia. He is requesting a call back.  ?

## 2022-04-07 NOTE — Telephone Encounter (Signed)
The patient has pain in the chest intermittently.  Ntg has relieved it.  He works out 3-5 times per week and gets the pain during the work out but he thinks it is only on the days that he eats breakfast first.  He said he is known to have a hiatal hernia.  Used to get heartburn but does not anymore.  On prevacid he says.  Only sees his PCP for this.    Hx of CABG and stents most recently in 2020.  He is in no distress whatsoever today.   I explained how ntg can help relieve GI symptoms sometimes so it is hard to know which is causing the pain over the phone.  He will come for a DOD appointment on 04/10/22.   8:30 am.

## 2022-04-10 ENCOUNTER — Ambulatory Visit: Payer: Medicare Other | Admitting: Cardiology

## 2022-04-10 ENCOUNTER — Encounter: Payer: Self-pay | Admitting: Cardiology

## 2022-04-10 VITALS — BP 120/74 | HR 74 | Ht 69.5 in | Wt 181.0 lb

## 2022-04-10 DIAGNOSIS — I25110 Atherosclerotic heart disease of native coronary artery with unstable angina pectoris: Secondary | ICD-10-CM

## 2022-04-10 DIAGNOSIS — I25119 Atherosclerotic heart disease of native coronary artery with unspecified angina pectoris: Secondary | ICD-10-CM | POA: Diagnosis not present

## 2022-04-10 DIAGNOSIS — I6523 Occlusion and stenosis of bilateral carotid arteries: Secondary | ICD-10-CM

## 2022-04-10 DIAGNOSIS — Z01812 Encounter for preprocedural laboratory examination: Secondary | ICD-10-CM

## 2022-04-10 DIAGNOSIS — I4819 Other persistent atrial fibrillation: Secondary | ICD-10-CM

## 2022-04-10 DIAGNOSIS — E785 Hyperlipidemia, unspecified: Secondary | ICD-10-CM

## 2022-04-10 DIAGNOSIS — I6522 Occlusion and stenosis of left carotid artery: Secondary | ICD-10-CM

## 2022-04-10 DIAGNOSIS — I10 Essential (primary) hypertension: Secondary | ICD-10-CM | POA: Diagnosis not present

## 2022-04-10 DIAGNOSIS — R079 Chest pain, unspecified: Secondary | ICD-10-CM

## 2022-04-10 LAB — BASIC METABOLIC PANEL
BUN/Creatinine Ratio: 18 (ref 10–24)
BUN: 22 mg/dL (ref 10–36)
CO2: 22 mmol/L (ref 20–29)
Calcium: 9.4 mg/dL (ref 8.6–10.2)
Chloride: 101 mmol/L (ref 96–106)
Creatinine, Ser: 1.24 mg/dL (ref 0.76–1.27)
Glucose: 176 mg/dL — ABNORMAL HIGH (ref 70–99)
Potassium: 4.3 mmol/L (ref 3.5–5.2)
Sodium: 138 mmol/L (ref 134–144)
eGFR: 55 mL/min/{1.73_m2} — ABNORMAL LOW (ref >59)

## 2022-04-10 LAB — CBC WITH DIFFERENTIAL/PLATELET
Basophils Absolute: 0 10*3/uL (ref 0.0–0.2)
Basos: 0 %
EOS (ABSOLUTE): 0.2 10*3/uL (ref 0.0–0.4)
Eos: 3 %
Hematocrit: 41.2 % (ref 37.5–51.0)
Hemoglobin: 14.2 g/dL (ref 13.0–17.7)
Immature Grans (Abs): 0 10*3/uL (ref 0.0–0.1)
Immature Granulocytes: 1 %
Lymphocytes Absolute: 2.1 10*3/uL (ref 0.7–3.1)
Lymphs: 32 %
MCH: 31.2 pg (ref 26.6–33.0)
MCHC: 34.5 g/dL (ref 31.5–35.7)
MCV: 91 fL (ref 79–97)
Monocytes Absolute: 0.4 10*3/uL (ref 0.1–0.9)
Monocytes: 7 %
Neutrophils Absolute: 3.8 10*3/uL (ref 1.4–7.0)
Neutrophils: 57 %
Platelets: 140 10*3/uL — ABNORMAL LOW (ref 150–450)
RBC: 4.55 x10E6/uL (ref 4.14–5.80)
RDW: 13.4 % (ref 11.6–15.4)
WBC: 6.5 10*3/uL (ref 3.4–10.8)

## 2022-04-10 MED ORDER — ISOSORBIDE MONONITRATE ER 30 MG PO TB24: 15.0000 mg | ORAL_TABLET | Freq: Every day | ORAL | 3 refills | Status: DC

## 2022-04-10 NOTE — H&P (View-Only) (Signed)
Cardiology Office Note:    Date:  04/10/2022   ID:  William Weber, DOB 08/13/1930, MRN [id number]  PCP:  Christain Sacramento, MD   [facility] Cardiologist:  Lauree Chandler, MD {  Referring MD: Christain Sacramento, MD  History of Present Illness:    William Weber is a 88 y.o. male with a hx of CAD s/p CABG, atrial fibrillation, HTN, HLD, COPD, carotid artery disease and statin intolerance who is followed by Dr. Angelena Form who now presents as an urgent visit for chest pain.  Per review of the record, the patient has history of 5 vessel CABG in 2002 (LIMA to LAD, SVG to D1, sequential SVG to PDA and PLA, SVG to OM1)and a drug eluting stent placed in the Circumflex in 2009. 4 bypass grafts were noted to be patent by cath in 2009. The vein graft to the PDA did not supply the PLA as reported in the op report. He is known to have PAF but has refused anti-coagulation in the past. Echo January 2015 with normal LV systolic function, trivial AI, mild MR. He has been on ASA only. He has moderate carotid artery disease, stable by dopplers January 2022. He was admitted to [facility] June 2020 with a NSTEMI and had a drug eluting stent placed in the vein graft to the PDA. Also with severe disease in the vein graft to the diagonal and the vein graft to the OM with patent LIMA to LAD. He was re-admitted to [facility] 03/07/19 and had cardiac cath with placement of drug eluting stents in the vein graft to the Diagonal and in the vein graft to the OM. Echo June 2020 with normal LV systolic function and no significant valve disease.   Called clinic on 04/07/22 with worsening chest pain prompting urgent visit today.  Today, the patient states that over the past couple of weeks he has been noting worsening chest burning with exertion. The worst episodes was about 2 weeks ago when he raked his yard. Specifically, he developed chest burning but tried to "push through" and continue. Afterwards he had severe  pain that took about 1 day to go away. Since that time, he has had chest burning with only mild exertion. He has taken nitro a couple of times that helps improve the symptoms but does not completely take it away. Has chronic SOB that is not worsening. No LE edema, palpitations, lightheadedness, LE edema, orthopnea or PND.    No current chest pain.   Past Medical History:  Diagnosis Date   Anxiety    mild   Atrial fibrillation (HCC)    CAD (coronary artery disease)    DES proximal circumflex January, 2009  /   nuclear, December, 2011, no ischemia, ejection fraction 70% 02/20/19 PCI/DES to SVG-rPDA 03/10/19 PCI/DES x1 to SVG-Diag, DESx2 to SVG to OM   Carotid artery disease ([place])    Doppler, August, 2011, zero 78% RIC A., 58-85% LICA, stable   Chronic headaches    COPD (chronic obstructive pulmonary disease) ([place])    Diabetic peripheral neuropathy ([place]) 01/22/2018   Dyslipidemia    statin intolerance   Ejection fraction    EF 70%, nuclear, December, 2011   GERD (gastroesophageal reflux disease)    Hiatal hernia    Hx of CABG    [place], 2002   Hypercholesterolemia    Hypertension    MGUS (monoclonal gammopathy of unknown significance) 01/22/2018   Prostate cancer ([place])    TUR and implant  Rash    2009, etiology not clear, not from Plavix, Claritin helped   Statin intolerance     Past Surgical History:  Procedure Laterality Date   BLADDER SURGERY     CARDIAC SURGERY     CORONARY STENT INTERVENTION N/A 02/19/2019   Procedure: CORONARY STENT INTERVENTION;  Surgeon: Wellington Hampshire, MD;  Location: [place] [facility];  Service: Cardiovascular;  Laterality: N/A;   CORONARY STENT INTERVENTION N/A 03/10/2019   Procedure: CORONARY STENT INTERVENTION;  Surgeon: Burnell Blanks, MD;  Location: [place] [facility];  Service: Cardiovascular;  Laterality: N/A;   LEFT HEART CATH AND CORS/GRAFTS ANGIOGRAPHY N/A 02/19/2019   Procedure: LEFT HEART CATH AND CORS/GRAFTS ANGIOGRAPHY;  Surgeon:  Wellington Hampshire, MD;  Location: [place] [facility];  Service: Cardiovascular;  Laterality: N/A;   LEFT HEART CATH AND CORS/GRAFTS ANGIOGRAPHY N/A 03/10/2019   Procedure: LEFT HEART CATH AND CORS/GRAFTS ANGIOGRAPHY;  Surgeon: Burnell Blanks, MD;  Location: [place] [facility];  Service: Cardiovascular;  Laterality: N/A;   PROSTATE SURGERY      Current Medications: Current Meds  Medication Sig   Ascorbic Acid (VITAMIN C WITH ROSE HIPS) 1000 MG tablet Take 1,000 mg by mouth daily.   aspirin EC 81 MG tablet Take 81 mg by mouth daily.   clopidogrel (PLAVIX) 75 MG tablet TAKE 1 TABLET BY MOUTH EVERY DAY WITH BREAKFAST   ezetimibe (ZETIA) 10 MG tablet TAKE 1 TABLET(10 MG) BY MOUTH DAILY   fish oil-omega-3 fatty acids 1000 MG capsule Take 1-2 g by mouth See admin instructions. Take 2 capsules (2 g) by mouth every morning and 1 capsule (1 g) every evening   isosorbide mononitrate (IMDUR) 30 MG 24 hr tablet Take 0.5 tablets (15 mg total) by mouth daily.   lansoprazole (PREVACID) 30 MG capsule Take 30 mg by mouth daily at 12 noon.   nitroGLYCERIN (NITROSTAT) 0.4 MG SL tablet Place 1 tablet (0.4 mg total) under the tongue every 5 (five) minutes x 3 doses as needed for chest pain.   triamterene-hydrochlorothiazide (DYAZIDE) 37.5-25 MG capsule TAKE 1 CAPSULE BY MOUTH DAILY FOR BLOOD PRESSURE   vitamin B-12 (CYANOCOBALAMIN) 100 MCG tablet Take 100 mcg by mouth daily.     Allergies:   Metformin and related, Allopurinol, Percocet [oxycodone-acetaminophen], Rabeprazole sodium, and Statins   Social History   Socioeconomic History   Marital status: Married    Spouse name: Not on file   Number of children: Not on file   Years of education: Not on file   Highest education level: Not on file  Occupational History   Not on file  Tobacco Use   Smoking status: Never   Smokeless tobacco: Never  Vaping Use   Vaping Use: Never used  Substance and Sexual Activity   Alcohol use: No   Drug use: No    Sexual activity: Not on file  Other Topics Concern   Not on file  Social History Narrative   Not on file   Social Determinants of Health   Financial Resource Strain: Not on file  Food Insecurity: Not on file  Transportation Needs: Not on file  Physical Activity: Not on file  Stress: Not on file  Social Connections: Not on file     Family History: The patient's family history includes Diabetes in his brother and sister.  ROS:   Please see the history of present illness.     All other systems reviewed and are negative.  EKGs/Labs/Other Studies Reviewed:  The following studies were reviewed today: Carotid Ultrasound 09/2021: Summary:  Right Carotid: Velocities in the right ICA are consistent with a 1-39%  stenosis.   Left Carotid: Velocities in the left ICA are consistent with a 40-59%  stenosis.   Vertebrals:  Left vertebral artery demonstrates antegrade flow. Antergrade  and               High Resistant.  Subclavians: Normal flow hemodynamics were seen in bilateral subclavian               arteries.   *See table(s) above for measurements and observations.  Suggest follow up study in 12 months.  LHC 02/2019: Mid LM lesion is 95% stenosed. Prox LAD lesion is 100% stenosed. 1st Diag lesion is 95% stenosed. Ost Cx to Prox Cx lesion is 95% stenosed. Ost 1st Mrg lesion is 80% stenosed. Mid RCA lesion is 70% stenosed. Dist RCA lesion is 50% stenosed. RPAV lesion is 70% stenosed. SVG. Origin to Prox Graft lesion is 40% stenosed. Origin to Prox Graft lesion is 90% stenosed. Mid Graft to Dist Graft lesion is 85% stenosed. LIMA and is normal in caliber. Previously placed Dist Graft stent (unknown type) is widely patent. A drug-eluting stent was successfully placed using a STENT RESOLUTE ONYX 2.25X18. Post intervention, there is a 0% residual stenosis. A drug-eluting stent was successfully placed using a STENT RESOLUTE ONYX 3.0X12. Post intervention, there is a 0%  residual stenosis. A drug-eluting stent was successfully placed using a STENT RESOLUTE YCXK4.8J85. Post intervention, there is a 0% residual stenosis.   1. Severe triple vessel CAD s/p 4V CABG with 4/4 patent bypass grafts 2. Occluded mid LAD. Patent LIMA to LAD 3. Occluded Diagonal. Patent SVG to Diagonal with severe stenosis in the anastomosis of the SVG to the Diagonal extending into the Diagonal branch. Successful PTCA/DES x 1 SVG to Diagonal. 4. Occluded OM branch. Patent SVG to OM with severe stenosis proximal and mid body of vein graft. Successful PTCA/DES x 2 body of SVG to OM 5. Severe stenosis RCA. Patent SVG to RCA.  6. Severe left main stenosis and severe stenosis in the stent in the proximal Circumflex artery.    Recommendations: I do not think the left main and Circumflex lesion is favorable for PCI. The angulation of the Circumflex would make delivering a stent difficult. I chose to work on both vein grafts. Will plan medical management for now of the left main disease. Continue ASA and Plavix.   Echo June 2020:  1. The left ventricle has normal systolic function with an ejection fraction of 60-65%. The cavity size was normal. Mild basal septal hypertrophy. Left ventricular diastolic function could not be evaluated secondary to atrial fibrillation. No evidence  of left ventricular regional wall motion abnormalities.  2. The right ventricle has normal systolic function. The cavity was normal. There is no increase in right ventricular wall thickness.  3. Left atrial size was mildly dilated.  4. There is moderate mitral annular calcification present.  5. The inferior vena cava was dilated in size with >50% respiratory variability.  EKG:  EKG is  ordered today.  The ekg ordered today demonstrates Afib with TWI in the inferolateral leads  Recent Labs: No results found for requested labs within last 365 days.  Recent Lipid Panel    Component Value Date/Time   CHOL 163 02/19/2019  0339   CHOL 162 12/10/2017 0939   TRIG 127 02/19/2019 0339   HDL 45 02/19/2019 0339  HDL 37 (L) 12/10/2017 0939   CHOLHDL 3.6 02/19/2019 0339   VLDL 25 02/19/2019 0339   LDLCALC 93 02/19/2019 0339   LDLCALC 104 (H) 12/10/2017 0939     Risk Assessment/Calculations:    CHA2DS2-VASc Score = 4   This indicates a 4.8% annual risk of stroke. The patient's score is based upon: CHF History: 0 HTN History: 1 Diabetes History: 0 Stroke History: 0 Vascular Disease History: 1 Age Score: 2 Gender Score: 0          Physical Exam:    VS:  BP 120/74   Pulse 74   Ht 5' 9.5" (1.765 m)   Wt 181 lb (82.1 kg)   SpO2 99%   BMI 26.35 kg/m     Wt Readings from Last 3 Encounters:  04/10/22 181 lb (82.1 kg)  09/28/21 178 lb (80.7 kg)  07/05/20 182 lb (82.6 kg)     GEN:  Well nourished, well developed in no acute distress HEENT: Normal NECK: No JVD; No carotid bruits CARDIAC: Irregular RESPIRATORY:  Clear to auscultation without rales, wheezing or rhonchi  ABDOMEN: Soft, non-tender, non-distended MUSCULOSKELETAL:  No edema; No deformity  SKIN: Warm and dry NEUROLOGIC:  Alert and oriented x 3 PSYCHIATRIC:  Normal affect   ASSESSMENT:    1. Coronary artery disease involving native coronary artery of native heart with angina pectoris ([place])   2. Pre-procedure lab exam   3. Essential hypertension   4. Coronary artery disease involving native coronary artery of native heart with unstable angina pectoris ([place])   5. Stenosis of left carotid artery   6. Chest pain of uncertain etiology   7. Dyslipidemia   8. Persistent atrial fibrillation ([place])   9. Bilateral carotid artery stenosis    PLAN:    In order of problems listed above:  #CAD s/p CABG with multiple subsequent PCIs: S/p 5 vessel CABGin 2002 with LIMA to LAD, SVG to D1, sequential SVG to PDA and PLA, SVG to OM1 with multiple subsequent PCIs. Last cath in 2020 as detailed above. Now with progressive chest burning with  exertion concerning for worsening angina. Patient states symptoms are significantly limiting his ability to work outside. He would like to pursue intervention if able to help minimize symptoms and improve quality of life. Will arrange for cath and start imdur at this time. -Plan for LHC -Start imdur '15mg'$  daily -Continue ASA '81mg'$  daily, plavix '75mg'$  daily, zetia '10mg'$  daily -Statin intolerance -Not on beta blocker; can add as able  #Persisent Afib: Refused DOAC in the past. Not on nodal agents and currently rate controlled.   #Carotid Artery Disease: Moderate disease with left 40-59%, RICA 1-39%. -Continue ASA '81mg'$  daily, plavix '75mg'$  daily, zetia '10mg'$  daily -Intolerant of statins  #HLD: #Statin Intolerance: -Continue zetia '10mg'$  daily -LDL 144 -Declined PCSK9i      Shared Decision Making/Informed Consent The risks [stroke (1 in 1000), death (1 in 1000), kidney failure [usually temporary] (1 in 500), bleeding (1 in 200), allergic reaction [possibly serious] (1 in 200)], benefits (diagnostic support and management of coronary artery disease) and alternatives of a cardiac catheterization were discussed in detail with Mr. Tippett and he is willing to proceed.    Medication Adjustments/Labs and Tests Ordered: Current medicines are reviewed at length with the patient today.  Concerns regarding medicines are outlined above.  Orders Placed This Encounter  Procedures   CBC w/Diff   Basic metabolic panel   EKG [id number]   Meds ordered this encounter  Medications  isosorbide mononitrate (IMDUR) 30 MG 24 hr tablet    Sig: Take 0.5 tablets (15 mg total) by mouth daily.    Dispense:  45 tablet    Refill:  3    Patient Instructions  Medication Instructions:   START TAKING ISOSORBIDE MONONITRATE (IMDUR) 15 MG BY MOUTH DAILY  *If you need a refill on your cardiac medications before your next appointment, please call your pharmacy*   Lab Work:  TODAY--PRE-PROCEDURE LABS--BMET AND CBC  W DIFF  If you have labs (blood work) drawn today and your tests are completely normal, you will receive your results only by: [place] (if you have MyChart) OR A paper copy in the mail If you have any lab test that is abnormal or we need to change your treatment, we will call you to review the results.   Testing/Procedures:   [place] OFFICE [place], [place] [address] Dept: [place]: [place]  04/10/2022  You are scheduled for a Cardiac Catheterization on Wednesday, July 26 with Dr. Sherren Mocha.  1. Please arrive at the Main Entrance A at [facility]: [place], [place] [address] at 11:00 AM (This time is two hours before your procedure to ensure your preparation). Free valet parking service is available.   Special note: Every effort is made to have your procedure done on time. Please understand that emergencies sometimes delay scheduled procedures.  2. Diet: Do not eat solid foods after midnight.  You may have clear liquids until 5 AM upon the day of the procedure.  3. Labs: You will need to have blood drawn on Monday, July 24 at [facility] at [facility]. [address]  Open: 7:30am - 5pm    Phone: [phone]. You do not need to be fasting.  4. Medication instructions in preparation for your procedure:   Contrast Allergy: No   Stop taking, DYAZIDE THE MORNING OF THIS PROCEDURE--HOLD ON  Wednesday, July 26,   On the morning of your procedure, take Aspirin and Plavix/Clopidogrel and any morning medicines NOT listed above.  You may use sips of water.  5. Plan to go home the same day, you will only stay overnight if medically necessary. 6. You MUST have a responsible adult to drive you home. 7. An adult MUST be with you the first 24 hours after you arrive home. 8. Bring a current  list of your medications, and the last time and date medication taken. 9. Bring ID and current insurance cards. 10.Please wear clothes that are easy to get on and off and wear slip-on shoes.  Thank you for allowing [place] to care for you!   -- [place] [facility]    Follow-Up:  WITH DR. Angelena Form IN ONE MONTH--THIS WILL BE POST CATH FOLLOW-UP APPOINTMENT            Signed, Freada Bergeron, MD  04/10/2022 12:26 PM    [place]

## 2022-04-10 NOTE — Patient Instructions (Signed)
Medication Instructions:   START TAKING ISOSORBIDE MONONITRATE (IMDUR) 15 MG BY MOUTH DAILY  *If you need a refill on your cardiac medications before your next appointment, please call your pharmacy*   Lab Work:  TODAY--PRE-PROCEDURE LABS--BMET AND CBC W DIFF  If you have labs (blood work) drawn today and your tests are completely normal, you will receive your results only by: [place] (if you have MyChart) OR A paper copy in the mail If you have any lab test that is abnormal or we need to change your treatment, we will call you to review the results.   Testing/Procedures:   [place] OFFICE [place], [place] [address] Dept: [place]: [place]  04/10/2022  You are scheduled for a Cardiac Catheterization on Wednesday, July 26 with Dr. Sherren Mocha.  1. Please arrive at the Main Entrance A at [facility]: [place], [place] [address] at 11:00 AM (This time is two hours before your procedure to ensure your preparation). Free valet parking service is available.   Special note: Every effort is made to have your procedure done on time. Please understand that emergencies sometimes delay scheduled procedures.  2. Diet: Do not eat solid foods after midnight.  You may have clear liquids until 5 AM upon the day of the procedure.  3. Labs: You will need to have blood drawn on Monday, July 24 at [facility] at [facility]. [address]  Open: 7:30am - 5pm    Phone: [phone]. You do not need to be fasting.  4. Medication instructions in preparation for your procedure:   Contrast Allergy: No   Stop taking, DYAZIDE THE MORNING OF THIS PROCEDURE--HOLD ON  Wednesday, July 26,   On the morning of your procedure, take Aspirin and Plavix/Clopidogrel and any morning medicines NOT listed above.  You  may use sips of water.  5. Plan to go home the same day, you will only stay overnight if medically necessary. 6. You MUST have a responsible adult to drive you home. 7. An adult MUST be with you the first 24 hours after you arrive home. 8. Bring a current list of your medications, and the last time and date medication taken. 9. Bring ID and current insurance cards. 10.Please wear clothes that are easy to get on and off and wear slip-on shoes.  Thank you for allowing [place] to care for you!   -- [place] [facility] services    Follow-Up:  WITH DR. Angelena Form IN ONE MONTH--THIS WILL BE POST CATH FOLLOW-UP APPOINTMENT

## 2022-04-10 NOTE — Progress Notes (Signed)
Cardiology Office Note:    Date:  04/10/2022   ID:  [name], DOB 08/31/1930, MRN [id number]  PCP:  Christain Sacramento, MD   [place] Providers Cardiologist:  Lauree Chandler, MD {  Referring MD: Christain Sacramento, MD  History of Present Illness:    William Weber is a 88 y.o. male with a hx of CAD s/p CABG, atrial fibrillation, HTN, HLD, COPD, carotid artery disease and statin intolerance who is followed by Dr. Angelena Form who now presents as an urgent visit for chest pain.  Per review of the record, the patient has history of 5 vessel CABG in 2002 (LIMA to LAD, SVG to D1, sequential SVG to PDA and PLA, SVG to OM1)and a drug eluting stent placed in the Circumflex in 2009. 4 bypass grafts were noted to be patent by cath in 2009. The vein graft to the PDA did not supply the PLA as reported in the op report. He is known to have PAF but has refused anti-coagulation in the past. Echo January 2015 with normal LV systolic function, trivial AI, mild MR. He has been on ASA only. He has moderate carotid artery disease, stable by dopplers January 2022. He was admitted to [facility] June 2020 with a NSTEMI and had a drug eluting stent placed in the vein graft to the PDA. Also with severe disease in the vein graft to the diagonal and the vein graft to the OM with patent LIMA to LAD. He was re-admitted to [facility] 03/07/19 and had cardiac cath with placement of drug eluting stents in the vein graft to the Diagonal and in the vein graft to the OM. Echo June 2020 with normal LV systolic function and no significant valve disease.   Called clinic on 04/07/22 with worsening chest pain prompting urgent visit today.  Today, the patient states that over the past couple of weeks he has been noting worsening chest burning with exertion. The worst episodes was about 2 weeks ago when he raked his yard. Specifically, he developed chest burning but tried to "push through" and continue. Afterwards he had severe  pain that took about 1 day to go away. Since that time, he has had chest burning with only mild exertion. He has taken nitro a couple of times that helps improve the symptoms but does not completely take it away. Has chronic SOB that is not worsening. No LE edema, palpitations, lightheadedness, LE edema, orthopnea or PND.    No current chest pain.   Past Medical History:  Diagnosis Date   Anxiety    mild   Atrial fibrillation (HCC)    CAD (coronary artery disease)    DES proximal circumflex January, 2009  /   nuclear, December, 2011, no ischemia, ejection fraction 70% 02/20/19 PCI/DES to SVG-rPDA 03/10/19 PCI/DES x1 to SVG-Diag, DESx2 to SVG to OM   Carotid artery disease ([place])    Doppler, August, 2011, zero 79% RIC A., 39-03% LICA, stable   Chronic headaches    COPD (chronic obstructive pulmonary disease) ([place])    Diabetic peripheral neuropathy ([place]) 01/22/2018   Dyslipidemia    statin intolerance   Ejection fraction    EF 70%, nuclear, December, 2011   GERD (gastroesophageal reflux disease)    Hiatal hernia    Hx of CABG    [place], 2002   Hypercholesterolemia    Hypertension    MGUS (monoclonal gammopathy of unknown significance) 01/22/2018   Prostate cancer ([place])    TUR and implant  Rash    2009, etiology not clear, not from Plavix, Claritin helped   Statin intolerance     Past Surgical History:  Procedure Laterality Date   BLADDER SURGERY     CARDIAC SURGERY     CORONARY STENT INTERVENTION N/A 02/19/2019   Procedure: CORONARY STENT INTERVENTION;  Surgeon: Wellington Hampshire, MD;  Location: [facility];  Service: Cardiovascular;  Laterality: N/A;   CORONARY STENT INTERVENTION N/A 03/10/2019   Procedure: CORONARY STENT INTERVENTION;  Surgeon: Burnell Blanks, MD;  Location: [facility];  Service: Cardiovascular;  Laterality: N/A;   LEFT HEART CATH AND CORS/GRAFTS ANGIOGRAPHY N/A 02/19/2019   Procedure: LEFT HEART CATH AND CORS/GRAFTS ANGIOGRAPHY;  Surgeon:  Wellington Hampshire, MD;  Location: [facility];  Service: Cardiovascular;  Laterality: N/A;   LEFT HEART CATH AND CORS/GRAFTS ANGIOGRAPHY N/A 03/10/2019   Procedure: LEFT HEART CATH AND CORS/GRAFTS ANGIOGRAPHY;  Surgeon: Burnell Blanks, MD;  Location: [facility];  Service: Cardiovascular;  Laterality: N/A;   PROSTATE SURGERY      Current Medications: Current Meds  Medication Sig   Ascorbic Acid (VITAMIN C WITH ROSE HIPS) 1000 MG tablet Take 1,000 mg by mouth daily.   aspirin EC 81 MG tablet Take 81 mg by mouth daily.   clopidogrel (PLAVIX) 75 MG tablet TAKE 1 TABLET BY MOUTH EVERY DAY WITH BREAKFAST   ezetimibe (ZETIA) 10 MG tablet TAKE 1 TABLET(10 MG) BY MOUTH DAILY   fish oil-omega-3 fatty acids 1000 MG capsule Take 1-2 g by mouth See admin instructions. Take 2 capsules (2 g) by mouth every morning and 1 capsule (1 g) every evening   isosorbide mononitrate (IMDUR) 30 MG 24 hr tablet Take 0.5 tablets (15 mg total) by mouth daily.   lansoprazole (PREVACID) 30 MG capsule Take 30 mg by mouth daily at 12 noon.   nitroGLYCERIN (NITROSTAT) 0.4 MG SL tablet Place 1 tablet (0.4 mg total) under the tongue every 5 (five) minutes x 3 doses as needed for chest pain.   triamterene-hydrochlorothiazide (DYAZIDE) 37.5-25 MG capsule TAKE 1 CAPSULE BY MOUTH DAILY FOR BLOOD PRESSURE   vitamin B-12 (CYANOCOBALAMIN) 100 MCG tablet Take 100 mcg by mouth daily.     Allergies:   Metformin and related, Allopurinol, Percocet [oxycodone-acetaminophen], Rabeprazole sodium, and Statins   Social History   Socioeconomic History   Marital status: Married    Spouse name: Not on file   Number of children: Not on file   Years of education: Not on file   Highest education level: Not on file  Occupational History   Not on file  Tobacco Use   Smoking status: Never   Smokeless tobacco: Never  Vaping Use   Vaping Use: Never used  Substance and Sexual Activity   Alcohol use: No   Drug use: No    Sexual activity: Not on file  Other Topics Concern   Not on file  Social History Narrative   Not on file   Social Determinants of Health   Financial Resource Strain: Not on file  Food Insecurity: Not on file  Transportation Needs: Not on file  Physical Activity: Not on file  Stress: Not on file  Social Connections: Not on file     Family History: The patient's family history includes Diabetes in his brother and sister.  ROS:   Please see the history of present illness.     All other systems reviewed and are negative.  EKGs/Labs/Other Studies Reviewed:  The following studies were reviewed today: Carotid Ultrasound 09/2021: Summary:  Right Carotid: Velocities in the right ICA are consistent with a 1-39%  stenosis.   Left Carotid: Velocities in the left ICA are consistent with a 40-59%  stenosis.   Vertebrals:  Left vertebral artery demonstrates antegrade flow. Antergrade  and               High Resistant.  Subclavians: Normal flow hemodynamics were seen in bilateral subclavian               arteries.   *See table(s) above for measurements and observations.  Suggest follow up study in 12 months.  LHC 02/2019: Mid LM lesion is 95% stenosed. Prox LAD lesion is 100% stenosed. 1st Diag lesion is 95% stenosed. Ost Cx to Prox Cx lesion is 95% stenosed. Ost 1st Mrg lesion is 80% stenosed. Mid RCA lesion is 70% stenosed. Dist RCA lesion is 50% stenosed. RPAV lesion is 70% stenosed. SVG. Origin to Prox Graft lesion is 40% stenosed. Origin to Prox Graft lesion is 90% stenosed. Mid Graft to Dist Graft lesion is 85% stenosed. LIMA and is normal in caliber. Previously placed Dist Graft stent (unknown type) is widely patent. A drug-eluting stent was successfully placed using a STENT RESOLUTE ONYX 2.25X18. Post intervention, there is a 0% residual stenosis. A drug-eluting stent was successfully placed using a STENT RESOLUTE ONYX 3.0X12. Post intervention, there is a 0%  residual stenosis. A drug-eluting stent was successfully placed using a STENT RESOLUTE SJGG8.3M62. Post intervention, there is a 0% residual stenosis.   1. Severe triple vessel CAD s/p 4V CABG with 4/4 patent bypass grafts 2. Occluded mid LAD. Patent LIMA to LAD 3. Occluded Diagonal. Patent SVG to Diagonal with severe stenosis in the anastomosis of the SVG to the Diagonal extending into the Diagonal branch. Successful PTCA/DES x 1 SVG to Diagonal. 4. Occluded OM branch. Patent SVG to OM with severe stenosis proximal and mid body of vein graft. Successful PTCA/DES x 2 body of SVG to OM 5. Severe stenosis RCA. Patent SVG to RCA.  6. Severe left main stenosis and severe stenosis in the stent in the proximal Circumflex artery.    Recommendations: I do not think the left main and Circumflex lesion is favorable for PCI. The angulation of the Circumflex would make delivering a stent difficult. I chose to work on both vein grafts. Will plan medical management for now of the left main disease. Continue ASA and Plavix.   Echo June 2020:  1. The left ventricle has normal systolic function with an ejection fraction of 60-65%. The cavity size was normal. Mild basal septal hypertrophy. Left ventricular diastolic function could not be evaluated secondary to atrial fibrillation. No evidence  of left ventricular regional wall motion abnormalities.  2. The right ventricle has normal systolic function. The cavity was normal. There is no increase in right ventricular wall thickness.  3. Left atrial size was mildly dilated.  4. There is moderate mitral annular calcification present.  5. The inferior vena cava was dilated in size with >50% respiratory variability.  EKG:  EKG is  ordered today.  The ekg ordered today demonstrates Afib with TWI in the inferolateral leads  Recent Labs: No results found for requested labs within last 365 days.  Recent Lipid Panel    Component Value Date/Time   CHOL 163 02/19/2019  0339   CHOL 162 12/10/2017 0939   TRIG 127 02/19/2019 0339   HDL 45 02/19/2019 0339  HDL 37 (L) 12/10/2017 0939   CHOLHDL 3.6 02/19/2019 0339   VLDL 25 02/19/2019 0339   LDLCALC 93 02/19/2019 0339   LDLCALC 104 (H) 12/10/2017 0939     Risk Assessment/Calculations:    CHA2DS2-VASc Score = 4   This indicates a 4.8% annual risk of stroke. The patient's score is based upon: CHF History: 0 HTN History: 1 Diabetes History: 0 Stroke History: 0 Vascular Disease History: 1 Age Score: 2 Gender Score: 0          Physical Exam:    VS:  BP 120/74   Pulse 74   Ht 5' 9.5" (1.765 m)   Wt 181 lb (82.1 kg)   SpO2 99%   BMI 26.35 kg/m     Wt Readings from Last 3 Encounters:  04/10/22 181 lb (82.1 kg)  09/28/21 178 lb (80.7 kg)  07/05/20 182 lb (82.6 kg)     GEN:  Well nourished, well developed in no acute distress HEENT: Normal NECK: No JVD; No carotid bruits CARDIAC: Irregular RESPIRATORY:  Clear to auscultation without rales, wheezing or rhonchi  ABDOMEN: Soft, non-tender, non-distended MUSCULOSKELETAL:  No edema; No deformity  SKIN: Warm and dry NEUROLOGIC:  Alert and oriented x 3 PSYCHIATRIC:  Normal affect   ASSESSMENT:    1. Coronary artery disease involving native coronary artery of native heart with angina pectoris ([place])   2. Pre-procedure lab exam   3. Essential hypertension   4. Coronary artery disease involving native coronary artery of native heart with unstable angina pectoris ([place])   5. Stenosis of left carotid artery   6. Chest pain of uncertain etiology   7. Dyslipidemia   8. Persistent atrial fibrillation ([place])   9. Bilateral carotid artery stenosis    PLAN:    In order of problems listed above:  #CAD s/p CABG with multiple subsequent PCIs: S/p 5 vessel CABGin 2002 with LIMA to LAD, SVG to D1, sequential SVG to PDA and PLA, SVG to OM1 with multiple subsequent PCIs. Last cath in 2020 as detailed above. Now with progressive chest burning with  exertion concerning for worsening angina. Patient states symptoms are significantly limiting his ability to work outside. He would like to pursue intervention if able to help minimize symptoms and improve quality of life. Will arrange for cath and start imdur at this time. -Plan for LHC -Start imdur '15mg'$  daily -Continue ASA '81mg'$  daily, plavix '75mg'$  daily, zetia '10mg'$  daily -Statin intolerance -Not on beta blocker; can add as able  #Persisent Afib: Refused DOAC in the past. Not on nodal agents and currently rate controlled.   #Carotid Artery Disease: Moderate disease with left 40-59%, RICA 1-39%. -Continue ASA '81mg'$  daily, plavix '75mg'$  daily, zetia '10mg'$  daily -Intolerant of statins  #HLD: #Statin Intolerance: -Continue zetia '10mg'$  daily -LDL 144 -Declined PCSK9i      Shared Decision Making/Informed Consent The risks [stroke (1 in 1000), death (1 in 1000), kidney failure [usually temporary] (1 in 500), bleeding (1 in 200), allergic reaction [possibly serious] (1 in 200)], benefits (diagnostic support and management of coronary artery disease) and alternatives of a cardiac catheterization were discussed in detail with William Weber and he is willing to proceed.    Medication Adjustments/Labs and Tests Ordered: Current medicines are reviewed at length with the patient today.  Concerns regarding medicines are outlined above.  Orders Placed This Encounter  Procedures   CBC w/Diff   Basic metabolic panel   EKG 27-CWCB   Meds ordered this encounter  Medications  isosorbide mononitrate (IMDUR) 30 MG 24 hr tablet    Sig: Take 0.5 tablets (15 mg total) by mouth daily.    Dispense:  45 tablet    Refill:  3    Patient Instructions  Medication Instructions:   START TAKING ISOSORBIDE MONONITRATE (IMDUR) 15 MG BY MOUTH DAILY  *If you need a refill on your cardiac medications before your next appointment, please call your pharmacy*   Lab Work:  TODAY--PRE-PROCEDURE LABS--BMET AND CBC  W DIFF  If you have labs (blood work) drawn today and your tests are completely normal, you will receive your results only by: [place] (if you have MyChart) OR A paper copy in the mail If you have any lab test that is abnormal or we need to change your treatment, we will call you to review the results.   Testing/Procedures:   [facility] OFFICE [place], [place] [address] Dept: [place]: [place]  04/10/2022  You are scheduled for a Cardiac Catheterization on Wednesday, July 26 with Dr. Sherren Mocha.  1. Please arrive at the Main Entrance A at [facility]: [place], [place] [address] at 11:00 AM (This time is two hours before your procedure to ensure your preparation). Free valet parking service is available.   Special note: Every effort is made to have your procedure done on time. Please understand that emergencies sometimes delay scheduled procedures.  2. Diet: Do not eat solid foods after midnight.  You may have clear liquids until 5 AM upon the day of the procedure.  3. Labs: You will need to have blood drawn on Monday, July 24 at [facility] [place]. [address]  Open: 7:30am - 5pm    Phone: [phone]. You do not need to be fasting.  4. Medication instructions in preparation for your procedure:   Contrast Allergy: No   Stop taking, DYAZIDE THE MORNING OF THIS PROCEDURE--HOLD ON  Wednesday, July 26,   On the morning of your procedure, take Aspirin and Plavix/Clopidogrel and any morning medicines NOT listed above.  You may use sips of water.  5. Plan to go home the same day, you will only stay overnight if medically necessary. 6. You MUST have a responsible adult to drive you home. 7. An adult MUST be with you the first 24 hours after you arrive home. 8. Bring a current  list of your medications, and the last time and date medication taken. 9. Bring ID and current insurance cards. 10.Please wear clothes that are easy to get on and off and wear slip-on shoes.  Thank you for allowing [place] to care for you!   -- [facility]    Follow-Up:  WITH DR. Angelena Form IN ONE MONTH--THIS WILL BE POST CATH FOLLOW-UP APPOINTMENT            Signed, Freada Bergeron, MD  04/10/2022 12:26 PM    [place]

## 2022-04-11 ENCOUNTER — Telehealth: Payer: Self-pay | Admitting: *Deleted

## 2022-04-11 NOTE — Telephone Encounter (Signed)
Cardiac Catheterization scheduled at [facility] for: Wednesday April 12, 2022 1 PM Arrival time and place: [place] Entrance A at: 11 AM   Nothing to eat after midnight prior to procedure, clear liquids until 5 AM day of procedure.  Medication instructions: -Hold:  Triamterene/HCT-day before and day of procedure -per protocol GFR 55-pt reports already taken today -Except hold medications usual morning medications can be taken with sips of water including aspirin 81 mg.  Confirmed patient has responsible adult to drive home post procedure and be with patient first 24 hours after arriving home.  Patient reports no new symptoms concerning for COVID-19 in the past 10 days.  Reviewed procedure instructions with patient.

## 2022-04-12 ENCOUNTER — Other Ambulatory Visit: Payer: Self-pay

## 2022-04-12 ENCOUNTER — Ambulatory Visit: Payer: Medicare Other | Admitting: Physician Assistant

## 2022-04-12 ENCOUNTER — Ambulatory Visit (HOSPITAL_COMMUNITY)
Admission: RE | Admit: 2022-04-12 | Discharge: 2022-04-13 | Disposition: A | Discharge status: Home / Self Care | Payer: Medicare Other | Attending: Cardiovascular Disease | Admitting: Cardiovascular Disease

## 2022-04-12 ENCOUNTER — Encounter (HOSPITAL_COMMUNITY)
Admission: RE | Disposition: A | Discharge status: Home / Self Care | Payer: Self-pay | Source: Home / Self Care | Attending: Cardiovascular Disease

## 2022-04-12 DIAGNOSIS — J449 Chronic obstructive pulmonary disease, unspecified: Secondary | ICD-10-CM | POA: Diagnosis not present

## 2022-04-12 DIAGNOSIS — I4819 Other persistent atrial fibrillation: Secondary | ICD-10-CM | POA: Diagnosis not present

## 2022-04-12 DIAGNOSIS — Z951 Presence of aortocoronary bypass graft: Secondary | ICD-10-CM

## 2022-04-12 DIAGNOSIS — I252 Old myocardial infarction: Secondary | ICD-10-CM | POA: Diagnosis not present

## 2022-04-12 DIAGNOSIS — I4891 Unspecified atrial fibrillation: Secondary | ICD-10-CM | POA: Diagnosis present

## 2022-04-12 DIAGNOSIS — I25710 Atherosclerosis of autologous vein coronary artery bypass graft(s) with unstable angina pectoris: Secondary | ICD-10-CM | POA: Diagnosis not present

## 2022-04-12 DIAGNOSIS — Z7902 Long term (current) use of antithrombotics/antiplatelets: Secondary | ICD-10-CM | POA: Insufficient documentation

## 2022-04-12 DIAGNOSIS — E119 Type 2 diabetes mellitus without complications: Secondary | ICD-10-CM | POA: Diagnosis not present

## 2022-04-12 DIAGNOSIS — I6523 Occlusion and stenosis of bilateral carotid arteries: Secondary | ICD-10-CM | POA: Diagnosis not present

## 2022-04-12 DIAGNOSIS — I25110 Atherosclerotic heart disease of native coronary artery with unstable angina pectoris: Secondary | ICD-10-CM | POA: Diagnosis present

## 2022-04-12 DIAGNOSIS — I10 Essential (primary) hypertension: Secondary | ICD-10-CM | POA: Diagnosis not present

## 2022-04-12 DIAGNOSIS — Z7982 Long term (current) use of aspirin: Secondary | ICD-10-CM | POA: Insufficient documentation

## 2022-04-12 DIAGNOSIS — E118 Type 2 diabetes mellitus with unspecified complications: Secondary | ICD-10-CM | POA: Diagnosis present

## 2022-04-12 DIAGNOSIS — E785 Hyperlipidemia, unspecified: Secondary | ICD-10-CM | POA: Insufficient documentation

## 2022-04-12 DIAGNOSIS — I2582 Chronic total occlusion of coronary artery: Secondary | ICD-10-CM | POA: Diagnosis not present

## 2022-04-12 DIAGNOSIS — Z79899 Other long term (current) drug therapy: Secondary | ICD-10-CM | POA: Diagnosis not present

## 2022-04-12 DIAGNOSIS — I209 Angina pectoris, unspecified: Secondary | ICD-10-CM | POA: Diagnosis present

## 2022-04-12 DIAGNOSIS — I25700 Atherosclerosis of coronary artery bypass graft(s), unspecified, with unstable angina pectoris: Secondary | ICD-10-CM | POA: Insufficient documentation

## 2022-04-12 DIAGNOSIS — T82855A Stenosis of coronary artery stent, initial encounter: Secondary | ICD-10-CM | POA: Diagnosis not present

## 2022-04-12 DIAGNOSIS — Z955 Presence of coronary angioplasty implant and graft: Secondary | ICD-10-CM | POA: Insufficient documentation

## 2022-04-12 DIAGNOSIS — I779 Disorder of arteries and arterioles, unspecified: Secondary | ICD-10-CM | POA: Diagnosis present

## 2022-04-12 DIAGNOSIS — Y832 Surgical operation with anastomosis, bypass or graft as the cause of abnormal reaction of the patient, or of later complication, without mention of misadventure at the time of the procedure: Secondary | ICD-10-CM | POA: Insufficient documentation

## 2022-04-12 DIAGNOSIS — I2510 Atherosclerotic heart disease of native coronary artery without angina pectoris: Secondary | ICD-10-CM | POA: Diagnosis present

## 2022-04-12 HISTORY — PX: LEFT HEART CATH AND CORS/GRAFTS ANGIOGRAPHY: CATH118250

## 2022-04-12 HISTORY — PX: CORONARY STENT INTERVENTION: CATH118234

## 2022-04-12 LAB — GLUCOSE, CAPILLARY: Glucose-Capillary: 168 mg/dL — ABNORMAL HIGH (ref 70–99)

## 2022-04-12 LAB — POCT ACTIVATED CLOTTING TIME
Activated Clotting Time: 227 seconds
Activated Clotting Time: 269 seconds
Activated Clotting Time: 299 seconds

## 2022-04-12 SURGERY — LEFT HEART CATH AND CORS/GRAFTS ANGIOGRAPHY
Anesthesia: LOCAL

## 2022-04-12 MED ORDER — NITROGLYCERIN 0.4 MG SL SUBL: 0.4000 mg | SUBLINGUAL_TABLET | SUBLINGUAL | Status: DC | PRN

## 2022-04-12 MED ORDER — VERAPAMIL HCL 2.5 MG/ML IV SOLN
INTRAVENOUS | Status: DC | PRN
  Administered 2022-04-12 (×5): 200 ug via INTRACORONARY
  Administered 2022-04-12: 300 ug via INTRACORONARY

## 2022-04-12 MED ORDER — MIDAZOLAM HCL 2 MG/2ML IJ SOLN
INTRAMUSCULAR | Status: DC | PRN
  Administered 2022-04-12 (×2): 1 mg via INTRAVENOUS

## 2022-04-12 MED ORDER — SODIUM CHLORIDE 0.9% FLUSH: 3.0000 mL | INTRAVENOUS | Status: DC | PRN

## 2022-04-12 MED ORDER — MIDAZOLAM HCL 2 MG/2ML IJ SOLN
INTRAMUSCULAR | Status: AC
  Filled 2022-04-12: qty 2

## 2022-04-12 MED ORDER — LIDOCAINE HCL (PF) 1 % IJ SOLN
INTRAMUSCULAR | Status: AC
  Filled 2022-04-12: qty 30

## 2022-04-12 MED ORDER — HEPARIN SODIUM (PORCINE) 1000 UNIT/ML IJ SOLN
INTRAMUSCULAR | Status: DC | PRN
  Administered 2022-04-12: 8000 [IU] via INTRAVENOUS
  Administered 2022-04-12: 4000 [IU] via INTRAVENOUS
  Administered 2022-04-12: 3000 [IU] via INTRAVENOUS

## 2022-04-12 MED ORDER — ASPIRIN 81 MG PO CHEW: 81.0000 mg | CHEWABLE_TABLET | ORAL | Status: DC

## 2022-04-12 MED ORDER — SODIUM CHLORIDE 0.9% FLUSH
3.0000 mL | Freq: Two times a day (BID) | INTRAVENOUS | Status: DC
  Administered 2022-04-12: 3 mL via INTRAVENOUS

## 2022-04-12 MED ORDER — PANTOPRAZOLE SODIUM 40 MG PO TBEC
40.0000 mg | DELAYED_RELEASE_TABLET | Freq: Every day | ORAL | Status: DC
  Administered 2022-04-12 – 2022-04-13 (×2): 40 mg via ORAL
  Filled 2022-04-12 (×2): qty 1

## 2022-04-12 MED ORDER — SODIUM CHLORIDE 0.9 % WEIGHT BASED INFUSION
3.0000 mL/kg/h | INTRAVENOUS | Status: DC
  Administered 2022-04-12: 3 mL/kg/h via INTRAVENOUS

## 2022-04-12 MED ORDER — NITROGLYCERIN 1 MG/10 ML FOR IR/CATH LAB
INTRA_ARTERIAL | Status: AC
  Filled 2022-04-12: qty 10

## 2022-04-12 MED ORDER — FENTANYL CITRATE (PF) 100 MCG/2ML IJ SOLN
INTRAMUSCULAR | Status: AC
  Filled 2022-04-12: qty 2

## 2022-04-12 MED ORDER — SODIUM CHLORIDE 0.9 % IV SOLN: 250.0000 mL | INTRAVENOUS | Status: DC | PRN

## 2022-04-12 MED ORDER — HEPARIN (PORCINE) IN NACL 1000-0.9 UT/500ML-% IV SOLN
INTRAVENOUS | Status: AC
  Filled 2022-04-12: qty 1000

## 2022-04-12 MED ORDER — SODIUM CHLORIDE 0.9 % WEIGHT BASED INFUSION: 1.0000 mL/kg/h | INTRAVENOUS | Status: DC

## 2022-04-12 MED ORDER — LIDOCAINE HCL (PF) 1 % IJ SOLN
INTRAMUSCULAR | Status: DC | PRN
  Administered 2022-04-12: 20 mL via INTRADERMAL

## 2022-04-12 MED ORDER — CLOPIDOGREL BISULFATE 75 MG PO TABS
75.0000 mg | ORAL_TABLET | Freq: Every day | ORAL | Status: DC
  Administered 2022-04-13: 75 mg via ORAL
  Filled 2022-04-12: qty 1

## 2022-04-12 MED ORDER — EZETIMIBE 10 MG PO TABS
10.0000 mg | ORAL_TABLET | Freq: Every day | ORAL | Status: DC
  Administered 2022-04-12: 10 mg via ORAL
  Filled 2022-04-12 (×2): qty 1

## 2022-04-12 MED ORDER — ONDANSETRON HCL 4 MG/2ML IJ SOLN: 4.0000 mg | Freq: Four times a day (QID) | INTRAMUSCULAR | Status: DC | PRN

## 2022-04-12 MED ORDER — TRIAMTERENE-HCTZ 37.5-25 MG PO TABS
1.0000 | ORAL_TABLET | Freq: Every day | ORAL | Status: DC
Start: 2022-04-12 — End: 2022-04-13
  Administered 2022-04-13: 1 via ORAL
  Filled 2022-04-12 (×3): qty 1

## 2022-04-12 MED ORDER — IOHEXOL 350 MG/ML SOLN
INTRAVENOUS | Status: DC | PRN
  Administered 2022-04-12: 160 mL via INTRA_ARTERIAL

## 2022-04-12 MED ORDER — FENTANYL CITRATE (PF) 100 MCG/2ML IJ SOLN
INTRAMUSCULAR | Status: DC | PRN
  Administered 2022-04-12 (×2): 25 ug via INTRAVENOUS

## 2022-04-12 MED ORDER — NITROGLYCERIN 1 MG/10 ML FOR IR/CATH LAB
INTRA_ARTERIAL | Status: DC | PRN
  Administered 2022-04-12 (×2): 200 ug via INTRACORONARY
  Administered 2022-04-12: 100 ug via INTRACORONARY

## 2022-04-12 MED ORDER — HYDRALAZINE HCL 20 MG/ML IJ SOLN: 10.0000 mg | INTRAMUSCULAR | Status: AC | PRN

## 2022-04-12 MED ORDER — HEPARIN SODIUM (PORCINE) 1000 UNIT/ML IJ SOLN
INTRAMUSCULAR | Status: AC
  Filled 2022-04-12: qty 10

## 2022-04-12 MED ORDER — LABETALOL HCL 5 MG/ML IV SOLN: 10.0000 mg | INTRAVENOUS | Status: AC | PRN

## 2022-04-12 MED ORDER — ASPIRIN 81 MG PO TBEC
81.0000 mg | DELAYED_RELEASE_TABLET | Freq: Every day | ORAL | Status: DC
  Administered 2022-04-13: 81 mg via ORAL
  Filled 2022-04-12: qty 1

## 2022-04-12 MED ORDER — HEPARIN (PORCINE) IN NACL 1000-0.9 UT/500ML-% IV SOLN
INTRAVENOUS | Status: DC | PRN
  Administered 2022-04-12 (×2): 500 mL

## 2022-04-12 MED ORDER — SODIUM CHLORIDE 0.9% FLUSH
3.0000 mL | Freq: Two times a day (BID) | INTRAVENOUS | Status: DC
  Administered 2022-04-13 (×2): 3 mL via INTRAVENOUS

## 2022-04-12 MED ORDER — VERAPAMIL HCL 2.5 MG/ML IV SOLN
INTRAVENOUS | Status: AC
  Filled 2022-04-12: qty 2

## 2022-04-12 MED ORDER — SODIUM CHLORIDE 0.9 % WEIGHT BASED INFUSION
1.0000 mL/kg/h | INTRAVENOUS | Status: AC
  Administered 2022-04-12 (×2): 1 mL/kg/h via INTRAVENOUS

## 2022-04-12 MED ORDER — ISOSORBIDE MONONITRATE ER 30 MG PO TB24
15.0000 mg | ORAL_TABLET | Freq: Every day | ORAL | Status: DC
  Administered 2022-04-13: 15 mg via ORAL
  Filled 2022-04-12: qty 1

## 2022-04-12 SURGICAL SUPPLY — 26 items
BALLN SAPPHIRE 3.0X12 (BALLOONS) ×2
BALLN SCOREFLEX 3.0X10 (BALLOONS) ×2
BALLOON SAPPHIRE 3.0X12 (BALLOONS) IMPLANT
BALLOON SCOREFLEX 3.0X10 (BALLOONS) IMPLANT
CATH INFINITI 5FR MULTPACK ANG (CATHETERS) ×1 IMPLANT
CATH VISTA GUIDE 6FR LCB (CATHETERS) ×1 IMPLANT
CATH VISTA GUIDE RCB (CATHETERS) ×1 IMPLANT
CLOSURE PERCLOSE PROSTYLE (VASCULAR PRODUCTS) ×1 IMPLANT
KIT ENCORE 26 ADVANTAGE (KITS) ×1 IMPLANT
KIT HEART LEFT (KITS) ×2 IMPLANT
KIT MICROPUNCTURE NIT STIFF (SHEATH) ×1 IMPLANT
PACK CARDIAC CATHETERIZATION (CUSTOM PROCEDURE TRAY) ×2 IMPLANT
SHEATH PINNACLE 5F 10CM (SHEATH) ×1 IMPLANT
SHEATH PINNACLE 6F 10CM (SHEATH) ×1 IMPLANT
SHEATH PROBE COVER 6X72 (BAG) ×1 IMPLANT
STENT SYNERGY XD 3.0X16 (Permanent Stent) IMPLANT
STENT SYNERGY XD 3.50X16 (Permanent Stent) IMPLANT
STENT SYNERGY XD 4.0X16 (Permanent Stent) IMPLANT
SYNERGY XD 3.0X16 (Permanent Stent) ×2 IMPLANT
SYNERGY XD 3.50X16 (Permanent Stent) ×2 IMPLANT
SYNERGY XD 4.0X16 (Permanent Stent) ×2 IMPLANT
TRANSDUCER W/STOPCOCK (MISCELLANEOUS) ×2 IMPLANT
TUBING CIL FLEX 10 FLL-RA (TUBING) ×2 IMPLANT
VALVE GUARDIAN II ~~LOC~~ HEMO (MISCELLANEOUS) ×1 IMPLANT
WIRE COUGAR XT STRL 190CM (WIRE) ×1 IMPLANT
WIRE EMERALD 3MM-J .035X150CM (WIRE) ×1 IMPLANT

## 2022-04-12 NOTE — Interval H&P Note (Signed)
History and Physical Interval Note:  04/12/2022 2:00 PM  William Weber  has presented today for surgery, with the diagnosis of chest pain - cad.  The various methods of treatment have been discussed with the patient and family. After consideration of risks, benefits and other options for treatment, the patient has consented to  Procedure(s): LEFT HEART CATH AND CORS/GRAFTS ANGIOGRAPHY (N/A) as a surgical intervention.  The patient's history has been reviewed, patient examined, no change in status, stable for surgery.  I have reviewed the patient's chart and labs.  Questions were answered to the patient's satisfaction.     Sherren Mocha

## 2022-04-12 NOTE — Progress Notes (Signed)
Pt received from cath lab to [place], VS wnL and as per flow. Pt oriented to 6E processes. (R) groin site C/D/I. Pt on bedrest until 7pm and reeducated as to why. Pt familiar with the [facility] system. All questions and concerns addressed. Call bell placed within reach, will continue to monitor and maintain safety. Family bedside with billing questions. Will get CM & SW involved.

## 2022-04-13 ENCOUNTER — Encounter (HOSPITAL_COMMUNITY): Payer: Self-pay | Admitting: Cardiovascular Disease

## 2022-04-13 DIAGNOSIS — T82855A Stenosis of coronary artery stent, initial encounter: Secondary | ICD-10-CM | POA: Diagnosis not present

## 2022-04-13 DIAGNOSIS — I25118 Atherosclerotic heart disease of native coronary artery with other forms of angina pectoris: Secondary | ICD-10-CM

## 2022-04-13 DIAGNOSIS — Z7982 Long term (current) use of aspirin: Secondary | ICD-10-CM | POA: Diagnosis not present

## 2022-04-13 DIAGNOSIS — E785 Hyperlipidemia, unspecified: Secondary | ICD-10-CM

## 2022-04-13 DIAGNOSIS — Z955 Presence of coronary angioplasty implant and graft: Secondary | ICD-10-CM | POA: Diagnosis not present

## 2022-04-13 DIAGNOSIS — I4811 Longstanding persistent atrial fibrillation: Secondary | ICD-10-CM

## 2022-04-13 DIAGNOSIS — I25110 Atherosclerotic heart disease of native coronary artery with unstable angina pectoris: Secondary | ICD-10-CM | POA: Diagnosis not present

## 2022-04-13 DIAGNOSIS — J449 Chronic obstructive pulmonary disease, unspecified: Secondary | ICD-10-CM | POA: Diagnosis not present

## 2022-04-13 DIAGNOSIS — I209 Angina pectoris, unspecified: Secondary | ICD-10-CM | POA: Diagnosis not present

## 2022-04-13 DIAGNOSIS — I10 Essential (primary) hypertension: Secondary | ICD-10-CM

## 2022-04-13 DIAGNOSIS — I25700 Atherosclerosis of coronary artery bypass graft(s), unspecified, with unstable angina pectoris: Secondary | ICD-10-CM | POA: Diagnosis not present

## 2022-04-13 DIAGNOSIS — Z951 Presence of aortocoronary bypass graft: Secondary | ICD-10-CM

## 2022-04-13 DIAGNOSIS — Z794 Long term (current) use of insulin: Secondary | ICD-10-CM

## 2022-04-13 DIAGNOSIS — E119 Type 2 diabetes mellitus without complications: Secondary | ICD-10-CM

## 2022-04-13 LAB — CBC
HCT: 34.4 % — ABNORMAL LOW (ref 39.0–52.0)
Hemoglobin: 11.9 g/dL — ABNORMAL LOW (ref 13.0–17.0)
MCH: 31.1 pg (ref 26.0–34.0)
MCHC: 34.6 g/dL (ref 30.0–36.0)
MCV: 89.8 fL (ref 80.0–100.0)
Platelets: 103 10*3/uL — ABNORMAL LOW (ref 150–400)
RBC: 3.83 MIL/uL — ABNORMAL LOW (ref 4.22–5.81)
RDW: 13.2 % (ref 11.5–15.5)
WBC: 4.4 10*3/uL (ref 4.0–10.5)
nRBC: 0 % (ref 0.0–0.2)

## 2022-04-13 LAB — BASIC METABOLIC PANEL
Anion gap: 6 (ref 5–15)
BUN: 21 mg/dL (ref 8–23)
CO2: 24 mmol/L (ref 22–32)
Calcium: 8.6 mg/dL — ABNORMAL LOW (ref 8.9–10.3)
Chloride: 107 mmol/L (ref 98–111)
Creatinine, Ser: 1.3 mg/dL — ABNORMAL HIGH (ref 0.61–1.24)
GFR, Estimated: 52 mL/min — ABNORMAL LOW (ref >60)
Glucose, Bld: 222 mg/dL — ABNORMAL HIGH (ref 70–99)
Potassium: 4 mmol/L (ref 3.5–5.1)
Sodium: 137 mmol/L (ref 135–145)

## 2022-04-13 NOTE — Discharge Summary (Signed)
Discharge Summary    Patient ID: WARDELL POKORSKI MRN: [id number]; DOB: 07-May-1930  Admit date: 04/12/2022 Discharge date: 04/13/2022  PCP:  Christain Sacramento, MD   [facility] Providers Cardiologist:  Lauree Chandler, MD   {    Discharge Diagnoses    Principal Problem:   Angina pectoris [facility]) Active Problems:   Coronary artery disease involving native coronary artery of native heart with unstable angina pectoris ([place])   Hypertension   Hyperlipidemia   COPD (chronic obstructive pulmonary disease) ([place])   S/P CABG (coronary artery bypass graft)   Carotid artery disease ([place])   Atrial fibrillation ([place])   Type 2 diabetes mellitus with complication, without long-term current use of insulin ([place])    Diagnostic Studies/Procedures    Left Cardiac Catheterization 04/12/2022: 1.  Severe native vessel coronary artery disease with total occlusion of the left main and severe stenoses of the ostial RCA, mid RCA, and distal RCA 2.  Status post CABG with continued patency of the LIMA to LAD and continued patency of the saphenous vein graft to OM with extensive previous stenting performed 3.  Critical stenosis of the saphenous vein graft to diagonal, treated successfully with a 4.0 x 16 mm Synergy DES 4.  Severe stenoses in the SVG to RCA distribution, with moderately severe proximal stenosis new from the previous study, treated with a 3.0 x 16 mm Synergy DES, severe stenosis in the mid body of the graft new from the previous study, treated successfully with a 3.5 x 16 mm Synergy DES, and severe in-stent restenosis in the PDA branch treated successfully with a 3.0 mm Wolverine scoring balloon  Recommendations: Indefinite dual antiplatelet therapy with aspirin and clopidogrel in the setting of degenerated vein graft disease and multiple vein graft stents.  Diagnostic Dominance: Right  Intervention    _____________   History of Present Illness     William Weber is a  88 y.o. male with a history of CAD s/p CABG x5 (LIMA-LAD, SVG-D1, SVG-OM1, sequential SVG-PDA-PLA) in 2002 with multiple subsequent PCIs (DES to LCX in 2009, NSTEMI in 02/2019 s/p DES to SVG to PDA graft followed by DES to SVG to D1 and SVG to OM1 later that same month), persistent atrial fibrillation (declined anticoagulation), carotid artery disease, COPD, hypertension, hyperlipidemia, type 2 diabetes mellitus, GERD, and MGUS who is followed by Dr. Angelena Form.  Patient was recently added onto Dr. Jacolyn Reedy scheduled on 04/10/2022 as an urgent visit for further evaluation of chest pain. Patient reported worsening chest burning with exertion over the past couple of weeks at that visit. He stated the worst episode was about 2 weeks prior and occurred while he was raking his yard. He developed chest burning at that time but tried to "push through" and continue. Afterwards, he had severe pain that took about 1 day to go away. Since that time, he has had chest burning with only mild exertion. He has taken sublingual Nitro a couple of times that helps improve the symptoms but does not completely take it away. He reported chronic shortness of breath that was stable. He denied any lower extremity edema, palpitations, lightheadedness, lower extremity edema, orthopnea, or PND.  Cardiac catheterization was arranged for further evaluation.  Hospital Course     Consultants: None  Patient presented to [facility] on 04/12/2022 for planned outpatient cardiac catheterization.  LHC showed severe native CAD with patent LIMA to LAD and SVG to OM with extensive prior stenting but cirtical stenosis of the SVG to  D1 and 3 severe stenoses of the SVG to RCA in the proximal, mid, and distal segments of the graft. Patient underwent successful PCI with DES to SVG to OM and DES to all 3 lesions of the SVG to RCA (total of 4 DES). Patient tolerated the procedure well with no recurrent chest pain. Right femoral cath site is soft with no  signs of hematoma. Cardiac Rehab will see prior to discharge. He is already on DAPT with Aspirin and Plavix at home. Plan is to continue this indefinitely given degenerated vein graft disease and multiple vein graft stents. Continue all other home medications. Of note, he has persistent atrial fibrillation but has refused anticoagulation in the past. He also has been intolerant to statins in the past and is only on Zetia as he has declined PCSK9 inhibitors.  Patient seen and examined by Dr. Audie Box today and determined to be stable for discharge. Outpatient follow-up has been arranged. Medications as below.  Did the patient have an acute coronary syndrome (MI, NSTEMI, STEMI, etc) this admission?:  No                               Did the patient have a percutaneous coronary intervention (stent / angioplasty)?:  Yes.     Cath/PCI Registry Performance & Quality Measures: Aspirin prescribed? - Yes ADP Receptor Inhibitor (Plavix/Clopidogrel, Brilinta/Ticagrelor or Effient/Prasugrel) prescribed (includes medically managed patients)? - Yes High Intensity Statin (Lipitor 40-'80mg'$  or Crestor 20-'40mg'$ ) prescribed? - No - intolerant to statins For EF <40%, was ACEI/ARB prescribed? - Not Applicable (EF >/= 16%) For EF <40%, Aldosterone Antagonist (Spironolactone or Eplerenone) prescribed? - Not Applicable (EF >/= 38%) Cardiac Rehab Phase II ordered? - Yes   _____________  Discharge Vitals Blood pressure 138/70, pulse 65, temperature 98.2 F (36.8 C), temperature source Oral, resp. rate 17, height 5' 9.5" (1.765 m), weight 82.1 kg, SpO2 98 %.  Filed Weights   04/12/22 1102  Weight: 82.1 kg    Labs & Radiologic Studies    CBC Recent Labs    04/10/22 0933 04/13/22 0204  WBC 6.5 4.4  NEUTROABS 3.8  --   HGB 14.2 11.9*  HCT 41.2 34.4*  MCV 91 89.8  PLT 140* 453*   Basic Metabolic Panel Recent Labs    04/10/22 0933 04/13/22 0204  NA 138 137  K 4.3 4.0  CL 101 107  CO2 22 24  GLUCOSE 176*  222*  BUN 22 21  CREATININE 1.24 1.30*  CALCIUM 9.4 8.6*   Liver Function Tests No results for input(s): "AST", "ALT", "ALKPHOS", "BILITOT", "PROT", "ALBUMIN" in the last 72 hours. No results for input(s): "LIPASE", "AMYLASE" in the last 72 hours. High Sensitivity Troponin:   No results for input(s): "TROPONINIHS" in the last 720 hours.  BNP Invalid input(s): "POCBNP" D-Dimer No results for input(s): "DDIMER" in the last 72 hours. Hemoglobin A1C No results for input(s): "HGBA1C" in the last 72 hours. Fasting Lipid Panel No results for input(s): "CHOL", "HDL", "LDLCALC", "TRIG", "CHOLHDL", "LDLDIRECT" in the last 72 hours. Thyroid Function Tests No results for input(s): "TSH", "T4TOTAL", "T3FREE", "THYROIDAB" in the last 72 hours.  Invalid input(s): "FREET3" _____________  CARDIAC CATHETERIZATION  Result Date: 04/12/2022 1.  Severe native vessel coronary artery disease with total occlusion of the left main and severe stenoses of the ostial RCA, mid RCA, and distal RCA 2.  Status post CABG with continued patency of the LIMA to LAD and  continued patency of the saphenous vein graft to OM with extensive previous stenting performed 3.  Critical stenosis of the saphenous vein graft to diagonal, treated successfully with a 4.0 x 16 mm Synergy DES 4.  Severe stenoses in the SVG to RCA distribution, with moderately severe proximal stenosis new from the previous study, treated with a 3.0 x 16 mm Synergy DES, severe stenosis in the mid body of the graft new from the previous study, treated successfully with a 3.5 x 16 mm Synergy DES, and severe in-stent restenosis in the PDA branch treated successfully with a 3.0 mm Wolverine scoring balloon Recommendations: Indefinite dual antiplatelet therapy with aspirin and clopidogrel in the setting of degenerated vein graft disease and multiple vein graft stents    Disposition   Pt is being discharged home today in good condition.  Follow-up Plans &  Appointments     Follow-up Information     Burnell Blanks, MD Follow up.   Specialty: Cardiology Why: Hospital follow-up with Cardiology scheduled for 05/12/2022 at 10:40am. Please arrive 15 minutes early for check-in. If this date/time does not work for you, please call our office to reschedule. Contact information: [address] [place] [address] [phone]                Discharge Instructions     AMB Referral to Cardiac Rehabilitation - Phase II   Complete by: As directed    Diagnosis: Coronary Stents   After initial evaluation and assessments completed: Virtual Based Care may be provided alone or in conjunction with Phase 2 Cardiac Rehab based on patient barriers.: Yes   Diet - low sodium heart healthy   Complete by: As directed    Increase activity slowly   Complete by: As directed        Discharge Medications   Allergies as of 04/13/2022       Reactions   Metformin And Related Diarrhea, Other (See Comments)   flatulence   Allopurinol Other (See Comments)   Painful skin nodules   Percocet [oxycodone-acetaminophen] Other (See Comments)   Dizziness and hallucinations    Rabeprazole Sodium Other (See Comments)   Unknown reaction   Statins Other (See Comments)   Hmg-Coa Reductase inhibitors, Myalgias (intolerance)        Medication List     TAKE these medications    aspirin EC 81 MG tablet Take 81 mg by mouth daily.   clopidogrel 75 MG tablet Commonly known as: PLAVIX TAKE 1 TABLET BY MOUTH EVERY DAY WITH BREAKFAST   cyanocobalamin 1000 MCG tablet Commonly known as: VITAMIN B12 Take 1,000 mcg by mouth 2 (two) times daily.   ezetimibe 10 MG tablet Commonly known as: ZETIA TAKE 1 TABLET(10 MG) BY MOUTH DAILY   Fish Oil 1200 MG Caps Take 1,200-2,400 mg by mouth See admin instructions. Take 2400 mg in the morning and 1200 mg in the evening   isosorbide mononitrate 30 MG 24 hr tablet Commonly known as: IMDUR Take  0.5 tablets (15 mg total) by mouth daily.   lansoprazole 15 MG capsule Commonly known as: PREVACID Take 15 mg by mouth daily as needed (heartburn).   nitroGLYCERIN 0.4 MG SL tablet Commonly known as: NITROSTAT Place 1 tablet (0.4 mg total) under the tongue every 5 (five) minutes x 3 doses as needed for chest pain.   OVER THE COUNTER MEDICATION Apply 1 Application topically daily as needed (pain). neuropathy frankincense and myrrh topical pain reliever   SYSTANE OP  Place 1 drop into both eyes daily as needed (dry eyes).   triamterene-hydrochlorothiazide 37.5-25 MG capsule Commonly known as: DYAZIDE TAKE 1 CAPSULE BY MOUTH DAILY FOR BLOOD PRESSURE   vitamin C with rose hips 1000 MG tablet Take 1,000 mg by mouth daily.           Outstanding Labs/Studies   N/A  Duration of Discharge Encounter   Greater than 30 minutes including physician time.  Signed, Darreld Mclean, PA-C 04/13/2022, 8:43 AM

## 2022-04-13 NOTE — Progress Notes (Signed)
CARDIAC REHAB PHASE I   PRE:  Rate/Rhythm: 69 a-fib  BP:  Sitting: 136/72      SaO2: 100 RA  MODE:  Ambulation: 150 ft   POST:  Rate/Rhythm: 103 a-fib  BP:  Sitting: 164/81      SaO2: 100 RA   Seen pt from [phone] pt was ambulated through hallway with standby assistance. Pt is unsteady on his feet.  Pt was returned to room w/o complaints and educated on stent procedure, restrictions, Aspirin & Plavix med use, ex guidelines, NTG use, heart healthy diet, and CRPII. Pt refused CRPII due to having a current membership at [facility]. I strongly encouraged that he uses his cane for additional support while walking long distances. Pt doesn't think he needs AD.   William Weber  9:22 AM 04/13/2022

## 2022-04-13 NOTE — Discharge Instructions (Addendum)
Post Cardiac Catheterization: NO HEAVY LIFTING OR SEXUAL ACTIVITY X 7 DAYS. NO DRIVING X 3-5 DAYS. NO SOAKING BATHS, HOT TUBS, POOLS, ETC., X 7 DAYS.   Groin Site Care: Refer to this sheet in the next few weeks. These instructions provide you with information on caring for yourself after your procedure. Your caregiver may also give you more specific instructions. Your treatment has been planned according to current medical practices, but problems sometimes occur. Call your caregiver if you have any problems or questions after your procedure. HOME CARE INSTRUCTIONS You may shower 24 hours after the procedure. Remove the bandage (dressing) and gently wash the site with plain soap and water. Gently pat the site dry.  Do not apply powder or lotion to the site.  Do not sit in a bathtub, swimming pool, or whirlpool for 5 to 7 days.  No bending, squatting, or lifting anything over 10 pounds (4.5 kg) as directed by your caregiver.  Inspect the site at least twice daily.  Do not drive home if you are discharged the same day of the procedure. Have someone else drive you.  What to expect: Any bruising will usually fade within 1 to 2 weeks.  Blood that collects in the tissue (hematoma) may be painful to the touch. It should usually decrease in size and tenderness within 1 to 2 weeks.  SEEK IMMEDIATE MEDICAL CARE IF: You have unusual pain at the groin site or down the affected leg.  You have redness, warmth, swelling, or pain at the groin site.  You have drainage (other than a small amount of blood on the dressing).  You have chills.  You have a fever or persistent symptoms for more than 72 hours.  You have a fever and your symptoms suddenly get worse.  Your leg becomes pale, cool, tingly, or numb.  You have heavy bleeding from the site. Hold pressure on the site.   Information about your medication: Plavix (anti-platelet agent)  Generic Name (Brand): clopidogrel (Plavix), once daily  medication  PURPOSE: You are taking this medication along with aspirin to lower your chance of having a heart attack, stroke, or blood clots in your heart stent. These can be fatal. Plavix and aspirin help prevent platelets from sticking together and forming a clot that can block an artery or your stent.   Common SIDE EFFECTS you may experience include: bruising or bleeding more easily, shortness of breath  Do not stop taking PLAVIX without talking to the doctor who prescribes it for you. People who are treated with a stent and stop taking Plavix too soon, have a higher risk of getting a blood clot in the stent, having a heart attack, or dying. If you stop Plavix because of bleeding, or for other reasons, your risk of a heart attack or stroke may increase.   Avoid taking NSAID agents or anti-inflammatory medications such as ibuprofen, naproxen given increased bleed risk with plavix - can use acetaminophen (Tylenol) if needed for pain.  Avoid taking over the counter stomach medications omeprazole (Prilosec) or esomeprazole (Nexium) since these do interact and make plavix less effective - ask your pharmacist or doctor for alterative agents if needed for heartburn or GERD.   Tell all of your doctors and dentists that you are taking Plavix. They should talk to the doctor who prescribed Plavix for you before you have any surgery or invasive procedure.   Contact your health care provider if you experience: severe or uncontrollable bleeding, pink/red/brown urine, vomiting blood or  vomit that looks like "coffee grounds", red or black stools (looks like tar), coughing up blood or blood clots ----------------------------------------------------------------------------------------------------------------------

## 2022-04-14 LAB — LIPOPROTEIN A (LPA): Lipoprotein (a): 51.6 nmol/L — ABNORMAL HIGH (ref <75.0)

## 2022-05-12 ENCOUNTER — Encounter: Payer: Self-pay | Admitting: Cardiovascular Disease

## 2022-05-12 ENCOUNTER — Ambulatory Visit: Payer: Medicare Other | Admitting: Cardiovascular Disease

## 2022-05-12 VITALS — BP 118/60 | HR 42 | Ht 69.5 in | Wt 180.2 lb

## 2022-05-12 DIAGNOSIS — I4819 Other persistent atrial fibrillation: Secondary | ICD-10-CM

## 2022-05-12 DIAGNOSIS — E785 Hyperlipidemia, unspecified: Secondary | ICD-10-CM

## 2022-05-12 DIAGNOSIS — I10 Essential (primary) hypertension: Secondary | ICD-10-CM

## 2022-05-12 DIAGNOSIS — I6523 Occlusion and stenosis of bilateral carotid arteries: Secondary | ICD-10-CM

## 2022-05-12 DIAGNOSIS — I25119 Atherosclerotic heart disease of native coronary artery with unspecified angina pectoris: Secondary | ICD-10-CM | POA: Diagnosis not present

## 2022-05-12 NOTE — Progress Notes (Signed)
Chief Complaint  Patient presents with   Follow-up    CAD   History of Present Illness: 88 yo male with history of CAD s/p CABG, atrial fibrillation, HTN, HLD, COPD, carotid artery disease and statin intolerance here today for cardiac follow up. He had a 5 vessel CABG in 2002 (LIMA to LAD, SVG to D1, sequential SVG to PDA and PLA, SVG to OM1)and a drug eluting stent placed in the Circumflex in 2009. 4 bypass grafts were noted to be patent by cath in 2009. The vein graft to the PDA did not supply the PLA as reported in the op report. He is known to have PAF but has refused anti-coagulation in the past. Echo January 2015 with normal LV systolic function, trivial AI, mild MR. He has been on ASA only. He has moderate carotid artery disease, stable by dopplers January 2022. He was admitted to [facility] June 2020 with a NSTEMI and had a drug eluting stent placed in the vein graft to the PDA. Also with severe disease in the vein graft to the diagonal and the vein graft to the OM with patent LIMA to LAD. He was re-admitted to [facility] 03/07/19 and had cardiac cath with placement of drug eluting stents in the vein graft to the Diagonal and in the vein graft to the OM. Echo June 2020 with normal LV systolic function and no significant valve disease. He was seen in our office in July 2023 with angina. Cardiac cath 04/12/22 with patent LIMA to LAD, patent SVG to OM. There was severe disease in the SVG to Diagonal that was treated with a drug eluting stent. There was severe disease in the SVG to RCA treated with 3 drug eluting stents.   He is here today for follow up. The patient denies any chest pain, dyspnea, palpitations, lower extremity edema, orthopnea, PND, dizziness, near syncope or syncope. Feeling well overall.    Primary Care Physician: Christain Sacramento, MD  Past Medical History:  Diagnosis Date   Anxiety    mild   Atrial fibrillation [facility])    CAD (coronary artery disease)    DES proximal circumflex January,  2009  /   nuclear, December, 2011, no ischemia, ejection fraction 70% 02/20/19 PCI/DES to SVG-rPDA 03/10/19 PCI/DES x1 to SVG-Diag, DESx2 to SVG to OM   Carotid artery disease ([place])    Doppler, August, 2011, zero 70% RIC A., 26-37% LICA, stable   Chronic headaches    COPD (chronic obstructive pulmonary disease) ([place])    Diabetic peripheral neuropathy ([place]) 01/22/2018   Dyslipidemia    statin intolerance   Ejection fraction    EF 70%, nuclear, December, 2011   GERD (gastroesophageal reflux disease)    Hiatal hernia    Hx of CABG    Hendrickson, 2002   Hypercholesterolemia    Hypertension    MGUS (monoclonal gammopathy of unknown significance) 01/22/2018   Prostate cancer ([place])    TUR and implant   Rash    2009, etiology not clear, not from Plavix, Claritin helped   Statin intolerance     Past Surgical History:  Procedure Laterality Date   BLADDER SURGERY     CARDIAC SURGERY     CORONARY STENT INTERVENTION N/A 02/19/2019   Procedure: CORONARY STENT INTERVENTION;  Surgeon: Wellington Hampshire, MD;  Location: [place] CV [facility];  Service: Cardiovascular;  Laterality: N/A;   CORONARY STENT INTERVENTION N/A 03/10/2019   Procedure: CORONARY STENT INTERVENTION;  Surgeon: Burnell Blanks, MD;  Location: [place] CV LAB;  Service: Cardiovascular;  Laterality: N/A;   CORONARY STENT INTERVENTION N/A 04/12/2022   Procedure: CORONARY STENT INTERVENTION;  Surgeon: Sherren Mocha, MD;  Location: [place] [facility];  Service: Cardiovascular;  Laterality: N/A;   LEFT HEART CATH AND CORS/GRAFTS ANGIOGRAPHY N/A 02/19/2019   Procedure: LEFT HEART CATH AND CORS/GRAFTS ANGIOGRAPHY;  Surgeon: Wellington Hampshire, MD;  Location: [place] CV LAB;  Service: Cardiovascular;  Laterality: N/A;   LEFT HEART CATH AND CORS/GRAFTS ANGIOGRAPHY N/A 03/10/2019   Procedure: LEFT HEART CATH AND CORS/GRAFTS ANGIOGRAPHY;  Surgeon: Burnell Blanks, MD;  Location: [place] CV LAB;  Service: Cardiovascular;   Laterality: N/A;   LEFT HEART CATH AND CORS/GRAFTS ANGIOGRAPHY N/A 04/12/2022   Procedure: LEFT HEART CATH AND CORS/GRAFTS ANGIOGRAPHY;  Surgeon: Sherren Mocha, MD;  Location: [place] [facility] LAB;  Service: Cardiovascular;  Laterality: N/A;   PROSTATE SURGERY      Current Outpatient Medications  Medication Sig Dispense Refill   Ascorbic Acid (VITAMIN C WITH ROSE HIPS) 1000 MG tablet Take 1,000 mg by mouth daily.     aspirin EC 81 MG tablet Take 81 mg by mouth daily.     clopidogrel (PLAVIX) 75 MG tablet TAKE 1 TABLET BY MOUTH EVERY DAY WITH BREAKFAST 90 tablet 3   ezetimibe (ZETIA) 10 MG tablet TAKE 1 TABLET(10 MG) BY MOUTH DAILY 90 tablet 3   isosorbide mononitrate (IMDUR) 30 MG 24 hr tablet Take 0.5 tablets (15 mg total) by mouth daily. 45 tablet 3   lansoprazole (PREVACID) 15 MG capsule Take 15 mg by mouth daily as needed (heartburn).     nitroGLYCERIN (NITROSTAT) 0.4 MG SL tablet Place 1 tablet (0.4 mg total) under the tongue every 5 (five) minutes x 3 doses as needed for chest pain. 25 tablet 2   Omega-3 Fatty Acids (FISH OIL) 1200 MG CAPS Take 1,200-2,400 mg by mouth See admin instructions. Take 2400 mg in the morning and 1200 mg in the evening     OVER THE COUNTER MEDICATION Apply 1 Application topically daily as needed (pain). neuropathy frankincense and myrrh topical pain reliever     Polyethyl Glycol-Propyl Glycol (SYSTANE OP) Place 1 drop into both eyes daily as needed (dry eyes).     triamterene-hydrochlorothiazide (DYAZIDE) 37.5-25 MG capsule TAKE 1 CAPSULE BY MOUTH DAILY FOR BLOOD PRESSURE     vitamin B-12 (CYANOCOBALAMIN) 1000 MCG tablet Take 1,000 mcg by mouth 2 (two) times daily.     No current facility-administered medications for this visit.    Allergies  Allergen Reactions   Metformin And Related Diarrhea and Other (See Comments)    flatulence   Allopurinol Other (See Comments)    Painful skin nodules   Percocet [Oxycodone-Acetaminophen] Other (See Comments)     Dizziness and hallucinations    Rabeprazole Sodium Other (See Comments)    Unknown reaction   Statins Other (See Comments)    Hmg-Coa Reductase inhibitors, Myalgias (intolerance)    Social History   Socioeconomic History   Marital status: Married    Spouse name: Not on file   Number of children: Not on file   Years of education: Not on file   Highest education level: Not on file  Occupational History   Not on file  Tobacco Use   Smoking status: Never   Smokeless tobacco: Never  Vaping Use   Vaping Use: Never used  Substance and Sexual Activity   Alcohol use: No   Drug use: No  Sexual activity: Not on file  Other Topics Concern   Not on file  Social History Narrative   Not on file   Social Determinants of Health   Financial Resource Strain: Not on file  Food Insecurity: Not on file  Transportation Needs: Not on file  Physical Activity: Not on file  Stress: Not on file  Social Connections: Not on file  Intimate Partner Violence: Not on file    Family History  Problem Relation Age of Onset   Diabetes Sister    Diabetes Brother     Review of Systems:  As stated in the HPI and otherwise negative.   BP 118/60   Pulse (!) 42   Ht 5' 9.5" (1.765 m)   Wt 180 lb 3.2 oz (81.7 kg)   SpO2 98%   BMI 26.23 kg/m   Physical Examination:  General: Well developed, well nourished, NAD  HEENT: OP clear, mucus membranes moist  SKIN: warm, dry. No rashes. Neuro: No focal deficits  Musculoskeletal: Muscle strength 5/5 all ext  Psychiatric: Mood and affect normal  Neck: No JVD, no carotid bruits, no thyromegaly, no lymphadenopathy.  Lungs:Clear bilaterally, no wheezes, rhonci, crackles Cardiovascular: Irreg irreg. No murmurs, gallops or rubs. Abdomen:Soft. Bowel sounds present. Non-tender.  Extremities: No lower extremity edema. Pulses are 2 + in the bilateral DP/PT.  Echo June 2020:  1. The left ventricle has normal systolic function with an ejection fraction of  60-65%. The cavity size was normal. Mild basal septal hypertrophy. Left ventricular diastolic function could not be evaluated secondary to atrial fibrillation. No evidence  of left ventricular regional wall motion abnormalities.  2. The right ventricle has normal systolic function. The cavity was normal. There is no increase in right ventricular wall thickness.  3. Left atrial size was mildly dilated.  4. There is moderate mitral annular calcification present.  5. The inferior vena cava was dilated in size with >50% respiratory variability.    EKG:  EKG is  ordered today. The ekg ordered today demonstrates atrial fib 85 bpm  Recent Labs: 04/13/2022: BUN 21; Creatinine, Ser 1.30; Hemoglobin 11.9; Platelets 103; Potassium 4.0; Sodium 137   Lipid Panel Followed in primary care   Wt Readings from Last 3 Encounters:  05/12/22 180 lb 3.2 oz (81.7 kg)  04/12/22 181 lb (82.1 kg)  04/10/22 181 lb (82.1 kg)     Other studies Reviewed: Additional studies/ records that were reviewed today include: . Review of the above records demonstrates:   Assessment and Plan:   1. CAD s/p CABG with angina: Doing well post PCI of two vein grafts in July 2023. Continue ASA, Plavix, Zetia. He is statin intolerant.   2. Persistent atrial fibrillation: Rate controlled atrial fib today. He refuses anti-coagulation. Will continue beta blocker   3. HTN: BP is controlled. No changes  4. HLD: He statin intolerant. He has refused to consider Praluent or Repatha. Will continue Zetia.   5. Carotid artery disease: Moderate disease by dopplers January 2023  Labs/ tests ordered today include:   No orders of the defined types were placed in this encounter.  Disposition:   F/U with me in 12  months   Signed, Lauree Chandler, MD 05/12/2022 11:06 AM    [facility] [place], [place], [place]  [address] Phone: [phone]; Fax: [phone]

## 2022-05-12 NOTE — Patient Instructions (Signed)
Medication Instructions:  No changes *If you need a refill on your cardiac medications before your next appointment, please call your pharmacy*   Lab Work: none If you have labs (blood work) drawn today and your tests are completely normal, you will receive your results only by: [place] (if you have MyChart) OR A paper copy in the mail If you have any lab test that is abnormal or we need to change your treatment, we will call you to review the results.   Testing/Procedures: none   Follow-Up: At [facility], you and your health needs are our priority.  As part of our continuing mission to provide you with exceptional heart care, we have created designated Provider Care Teams.  These Care Teams include your primary Cardiologist (physician) and Advanced Practice Providers (APPs -  Physician Assistants and Nurse Practitioners) who all work together to provide you with the care you need, when you need it.  We recommend signing up for the patient portal called "MyChart".  Sign up information is provided on this After Visit Summary.  MyChart is used to connect with patients for Virtual Visits (Telemedicine).  Patients are able to view lab/test results, encounter notes, upcoming appointments, etc.  Non-urgent messages can be sent to your provider as well.   To learn more about what you can do with MyChart, go to [email or web address].    Your next appointment:   12 month(s)  The format for your next appointment:   In Person  Provider:   Lauree Chandler, MD {   Important Information About Sugar

## 2022-10-30 ENCOUNTER — Other Ambulatory Visit: Payer: Self-pay

## 2022-10-30 MED ORDER — EZETIMIBE 10 MG PO TABS: ORAL_TABLET | ORAL | 1 refills | Status: DC

## 2022-10-30 MED ORDER — CLOPIDOGREL BISULFATE 75 MG PO TABS: ORAL_TABLET | ORAL | 1 refills | Status: DC

## 2023-03-26 ENCOUNTER — Other Ambulatory Visit: Payer: Self-pay

## 2023-03-26 MED ORDER — ISOSORBIDE MONONITRATE ER 30 MG PO TB24: 15.0000 mg | ORAL_TABLET | Freq: Every day | ORAL | 0 refills | Status: DC

## 2023-04-27 ENCOUNTER — Other Ambulatory Visit: Payer: Self-pay | Admitting: Cardiovascular Disease

## 2023-05-24 NOTE — Progress Notes (Deleted)
  Cardiology Office Note:    Date:  05/24/2023  ID:  William Weber, DOB [birth year], MRN [id number] PCP: Barbie Banner, MD  [facility] Providers Cardiologist:  Verne Carrow, MD { Click to update primary MD,subspecialty MD or APP then REFRESH:1}    {Click to Open Review  :1}   Patient Profile:      Coronary artery disease s/p CABG in 2002 (L-LAD, S-D1, S-PDA, S-OM1) S/p DES to LCx in 2009 NSTEMI s/p DES to S-PDA in 02/2019 Staged PCI w DES to S-D1, DES to S-OM TTE 02/19/2019: EF 60-65, NL RVSF, mild LAE S/p 4 x 16 mm DES to S-D1; s/p 3 x 16 mm ostial, 3.5 x 16 mm DES mid, s/p POBA dist to S-RCA in 03/2022 LHC 04/12/22: L-LAD patent, S-OM stents patent, S-D1 90; S-PDA ost 70, mid 95, dist 80 ISR Paroxysmal atrial fibrillation  Pt declined anticoagulation  Carotid artery disease [place] 09/30/21: R 1-39, L 40-59 Hypertension  Hyperlipidemia  Statin intol; Pt declines [id number] Chronic Obstructive Pulmonary Disease   MGUS       {      :1}   History of Present Illness:  Discussed the use of AI scribe software for clinical note transcription with the patient, who gave verbal consent to proceed.  History of Present Illness         William Weber is a 89 y.o. male who returns for follow up of CAD, AFib. He was last seen by Dr. Clifton James in 04/2022. *** ROS: ***    Studies Reviewed:       *** Risk Assessment/Calculations:   {Does this patient have ATRIAL FIBRILLATION?:[phone]} No BP recorded.  {Refresh Note OR Click here to enter BP  :1}***       Physical Exam:   VS:  There were no vitals taken for this visit.   Wt Readings from Last 3 Encounters:  05/12/22 180 lb 3.2 oz (81.7 kg)  04/12/22 181 lb (82.1 kg)  04/10/22 181 lb (82.1 kg)    Physical Exam***     Assessment and Plan:  No problem-specific Assessment & Plan notes found for this encounter. Assessment and Plan            {      :1}    {Are you ordering a CV Procedure (e.g. stress test, cath,  DCCV, TEE, etc)?   Press F2        :[id number]}  Dispo:  No follow-ups on file.  Signed, Tereso Newcomer, PA-C

## 2023-05-25 ENCOUNTER — Ambulatory Visit: Payer: Medicare Other | Attending: Physician Assistant | Admitting: Physician Assistant

## 2023-05-25 DIAGNOSIS — I4811 Longstanding persistent atrial fibrillation: Secondary | ICD-10-CM

## 2023-05-25 DIAGNOSIS — I25810 Atherosclerosis of coronary artery bypass graft(s) without angina pectoris: Secondary | ICD-10-CM

## 2023-05-25 DIAGNOSIS — I6523 Occlusion and stenosis of bilateral carotid arteries: Secondary | ICD-10-CM

## 2023-06-26 ENCOUNTER — Other Ambulatory Visit: Payer: Self-pay | Admitting: Cardiovascular Disease

## 2023-07-01 ENCOUNTER — Other Ambulatory Visit: Payer: Self-pay | Admitting: Cardiovascular Disease

## 2023-07-03 ENCOUNTER — Other Ambulatory Visit: Payer: Self-pay

## 2023-07-03 MED ORDER — ISOSORBIDE MONONITRATE ER 30 MG PO TB24: 15.0000 mg | ORAL_TABLET | Freq: Every day | ORAL | 0 refills | Status: DC

## 2023-07-05 ENCOUNTER — Telehealth: Payer: Self-pay | Admitting: Cardiovascular Disease

## 2023-07-05 MED ORDER — ISOSORBIDE MONONITRATE ER 30 MG PO TB24: 15.0000 mg | ORAL_TABLET | Freq: Every day | ORAL | 1 refills | Status: DC

## 2023-07-05 NOTE — Telephone Encounter (Signed)
*  STAT* If patient is at the pharmacy, call can be transferred to refill team.   1. Which medications need to be refilled? (please list name of each medication and dose if known)   isosorbide mononitrate (IMDUR) 30 MG 24 hr tablet     2. Would you like to learn more about the convenience, safety, & potential cost savings by using the [facility]? No   3. Are you open to using the [facility] (Type [facility].) No   4. Which pharmacy/location (including street and city if local pharmacy) is medication to be sent to?  [id number] - [place], [place] - [address] [place] [address]     5. Do they need a 30 day or 90 day supply? 90 day   Pt has scheduled appt on 08/15/23

## 2023-07-05 NOTE — Telephone Encounter (Signed)
Pt's medication was sent to pt's pharmacy as requested. Confirmation received.  °

## 2023-07-26 ENCOUNTER — Other Ambulatory Visit: Payer: Self-pay | Admitting: Cardiovascular Disease

## 2023-08-14 NOTE — Progress Notes (Unsigned)
No chief complaint on file.  History of Present Illness: 89 yo male with history of CAD s/p CABG, atrial fibrillation, HTN, HLD, COPD, carotid artery disease and statin intolerance here today for cardiac follow up. He had a 5 vessel CABG in 2002 (LIMA to LAD, SVG to D1, sequential SVG to PDA and PLA, SVG to OM1)and a drug eluting stent placed in the Circumflex in 2009. 4 bypass grafts were noted to be patent by cath in 2009. The vein graft to the PDA did not supply the PLA as reported in the op report. He is known to have PAF but has refused anti-coagulation in the past. Echo January 2015 with normal LV systolic function, trivial AI, mild MR. He has been on ASA only. He has moderate carotid artery disease, stable by dopplers January 2022. He was admitted to [facility] June 2020 with a NSTEMI and had a drug eluting stent placed in the vein graft to the PDA. Also with severe disease in the vein graft to the diagonal and the vein graft to the OM with patent LIMA to LAD. He was re-admitted to [facility] 03/07/19 and had cardiac cath with placement of drug eluting stents in the vein graft to the Diagonal and in the vein graft to the OM. Echo June 2020 with normal LV systolic function and no significant valve disease. He was seen in our office in July 2023 with angina. Cardiac cath 04/12/22 with patent LIMA to LAD, patent SVG to OM. There was severe disease in the SVG to Diagonal that was treated with a drug eluting stent. There was severe disease in the SVG to RCA treated with 3 drug eluting stents.   He is here today for follow up. The patient denies any chest pain, dyspnea, palpitations, lower extremity edema, orthopnea, PND, dizziness, near syncope or syncope.    Primary Care Physician: Barbie Banner, MD  Past Medical History:  Diagnosis Date   Anxiety    mild   Atrial fibrillation [facility])    CAD (coronary artery disease)    DES proximal circumflex January, 2009  /   nuclear, December, 2011, no ischemia, ejection  fraction 70% 02/20/19 PCI/DES to SVG-rPDA 03/10/19 PCI/DES x1 to SVG-Diag, DESx2 to SVG to OM   Carotid artery disease ([facility])    Doppler, August, 2011, zero 39% RIC A., 60-79% LICA, stable   Chronic headaches    COPD (chronic obstructive pulmonary disease) ([facility])    Diabetic peripheral neuropathy ([facility]) 01/22/2018   Dyslipidemia    statin intolerance   Ejection fraction    EF 70%, nuclear, December, 2011   GERD (gastroesophageal reflux disease)    Hiatal hernia    Hx of CABG    Hendrickson, 2002   Hypercholesterolemia    Hypertension    MGUS (monoclonal gammopathy of unknown significance) 01/22/2018   Prostate cancer ([facility])    TUR and implant   Rash    2009, etiology not clear, not from Plavix, Claritin helped   Statin intolerance     Past Surgical History:  Procedure Laterality Date   BLADDER SURGERY     CARDIAC SURGERY     CORONARY STENT INTERVENTION N/A 02/19/2019   Procedure: CORONARY STENT INTERVENTION;  Surgeon: Iran Ouch, MD;  [facility]: [facility];  Service: Cardiovascular;  Laterality: N/A;   CORONARY STENT INTERVENTION N/A 03/10/2019   Procedure: CORONARY STENT INTERVENTION;  Surgeon: Kathleene Hazel, MD;  Location: [facility];  Service: Cardiovascular;  Laterality: N/A;  CORONARY STENT INTERVENTION N/A 04/12/2022   Procedure: CORONARY STENT INTERVENTION;  Surgeon: Tonny Bollman, MD;  Location: [facility];  Service: Cardiovascular;  Laterality: N/A;   LEFT HEART CATH AND CORS/GRAFTS ANGIOGRAPHY N/A 02/19/2019   Procedure: LEFT HEART CATH AND CORS/GRAFTS ANGIOGRAPHY;  Surgeon: Iran Ouch, MD;  Location: [facility];  Service: Cardiovascular;  Laterality: N/A;   LEFT HEART CATH AND CORS/GRAFTS ANGIOGRAPHY N/A 03/10/2019   Procedure: LEFT HEART CATH AND CORS/GRAFTS ANGIOGRAPHY;  Surgeon: Kathleene Hazel, MD;  Location: [facility];  Service: Cardiovascular;  Laterality: N/A;   LEFT HEART CATH AND CORS/GRAFTS ANGIOGRAPHY  N/A 04/12/2022   Procedure: LEFT HEART CATH AND CORS/GRAFTS ANGIOGRAPHY;  Surgeon: Tonny Bollman, MD;  Location: [facility] INVASIVE CV LAB;  Service: Cardiovascular;  Laterality: N/A;   PROSTATE SURGERY      Current Outpatient Medications  Medication Sig Dispense Refill   Ascorbic Acid (VITAMIN C WITH ROSE HIPS) 1000 MG tablet Take 1,000 mg by mouth daily.     aspirin EC 81 MG tablet Take 81 mg by mouth daily.     clopidogrel (PLAVIX) 75 MG tablet TAKE 1 TABLET BY MOUTH EVERY DAY WITH BREAKFAST 30 tablet 0   ezetimibe (ZETIA) 10 MG tablet TAKE 1 TABLET(10 MG) BY MOUTH DAILY 30 tablet 0   isosorbide mononitrate (IMDUR) 30 MG 24 hr tablet Take 0.5 tablets (15 mg total) by mouth daily. 30 tablet 1   lansoprazole (PREVACID) 15 MG capsule Take 15 mg by mouth daily as needed (heartburn).     nitroGLYCERIN (NITROSTAT) 0.4 MG SL tablet Place 1 tablet (0.4 mg total) under the tongue every 5 (five) minutes x 3 doses as needed for chest pain. 25 tablet 2   Omega-3 Fatty Acids (FISH OIL) 1200 MG CAPS Take 1,200-2,400 mg by mouth See admin instructions. Take 2400 mg in the morning and 1200 mg in the evening     OVER THE COUNTER MEDICATION Apply 1 Application topically daily as needed (pain). neuropathy frankincense and myrrh topical pain reliever     Polyethyl Glycol-Propyl Glycol (SYSTANE OP) Place 1 drop into both eyes daily as needed (dry eyes).     triamterene-hydrochlorothiazide (DYAZIDE) 37.5-25 MG capsule TAKE 1 CAPSULE BY MOUTH DAILY FOR BLOOD PRESSURE     vitamin B-12 (CYANOCOBALAMIN) 1000 MCG tablet Take 1,000 mcg by mouth 2 (two) times daily.     No current facility-administered medications for this visit.    Allergies  Allergen Reactions   Metformin And Related Diarrhea and Other (See Comments)    flatulence   Allopurinol Other (See Comments)    Painful skin nodules   Percocet [Oxycodone-Acetaminophen] Other (See Comments)    Dizziness and hallucinations    Rabeprazole Sodium Other (See  Comments)    Unknown reaction   Statins Other (See Comments)    Hmg-Coa Reductase inhibitors, Myalgias (intolerance)    Social History   Socioeconomic History   Marital status: Married    Spouse name: Not on file   Number of children: Not on file   Years of education: Not on file   Highest education level: Not on file  Occupational History   Not on file  Tobacco Use   Smoking status: Never   Smokeless tobacco: Never  Vaping Use   Vaping status: Never Used  Substance and Sexual Activity   Alcohol use: No   Drug use: No   Sexual activity: Not on file  Other Topics Concern   Not on  file  Social History Narrative   Not on file   Social Determinants of Health   Financial Resource Strain: Not on file  Food Insecurity: Not on file  Transportation Needs: Not on file  Physical Activity: Not on file  Stress: Not on file  Social Connections: Not on file  Intimate Partner Violence: Not on file    Family History  Problem Relation Age of Onset   Diabetes Sister    Diabetes Brother     Review of Systems:  As stated in the HPI and otherwise negative.   There were no vitals taken for this visit.  Physical Examination:  General: Well developed, well nourished, NAD  HEENT: OP clear, mucus membranes moist  SKIN: warm, dry. No rashes. Neuro: No focal deficits  Musculoskeletal: Muscle strength 5/5 all ext  Psychiatric: Mood and affect normal  Neck: No JVD, no carotid bruits, no thyromegaly, no lymphadenopathy.  Lungs:Clear bilaterally, no wheezes, rhonci, crackles Cardiovascular: Regular rate and rhythm. No murmurs, gallops or rubs. Abdomen:Soft. Bowel sounds present. Non-tender.  Extremities: No lower extremity edema. Pulses are 2 + in the bilateral DP/PT.  Echo June 2020:  1. The left ventricle has normal systolic function with an ejection fraction of 60-65%. The cavity size was normal. Mild basal septal hypertrophy. Left ventricular diastolic function could not be  evaluated secondary to atrial fibrillation. No evidence  of left ventricular regional wall motion abnormalities.  2. The right ventricle has normal systolic function. The cavity was normal. There is no increase in right ventricular wall thickness.  3. Left atrial size was mildly dilated.  4. There is moderate mitral annular calcification present.  5. The inferior vena cava was dilated in size with >50% respiratory variability.    EKG:  EKG is *** ordered today. The ekg ordered today demonstrates  Recent Labs: No results found for requested labs within last 365 days.   Lipid Panel Followed in primary care   Wt Readings from Last 3 Encounters:  05/12/22 81.7 kg  04/12/22 82.1 kg  04/10/22 82.1 kg    Assessment and Plan:   1. CAD s/p CABG with angina: No chest pain suggestive of angina. Will continue ASA, Plavix and Zetia. He is statin intolerant.   2. Persistent atrial fibrillation: Atrial fib today with good rate control. He refuses anti-coagulation. Continue beta blocker   3. HTN: BP is well controlled. No changes today  4. HLD: He statin intolerant. He has refused to consider Praluent or Repatha. Continue Zetia.   5. Carotid artery disease: Moderate disease by dopplers January 2023. Will not repeat given advanced age.   Labs/ tests ordered today include:  No orders of the defined types were placed in this encounter.  Disposition:   F/U with me in 12  months   Signed, Verne Carrow, MD 08/14/2023 1:47 PM    [facility] [address] [place], [place], [place]  [address] Phone: [phone]; Fax: [phone]

## 2023-08-15 ENCOUNTER — Encounter: Payer: Self-pay | Admitting: Cardiovascular Disease

## 2023-08-15 ENCOUNTER — Ambulatory Visit: Payer: Medicare Other | Attending: Cardiovascular Disease | Admitting: Cardiovascular Disease

## 2023-08-15 VITALS — BP 130/66 | HR 64 | Ht 69.5 in | Wt 178.0 lb

## 2023-08-15 DIAGNOSIS — E785 Hyperlipidemia, unspecified: Secondary | ICD-10-CM | POA: Diagnosis not present

## 2023-08-15 DIAGNOSIS — I10 Essential (primary) hypertension: Secondary | ICD-10-CM

## 2023-08-15 DIAGNOSIS — I25119 Atherosclerotic heart disease of native coronary artery with unspecified angina pectoris: Secondary | ICD-10-CM

## 2023-08-15 DIAGNOSIS — I4819 Other persistent atrial fibrillation: Secondary | ICD-10-CM

## 2023-08-15 DIAGNOSIS — I6523 Occlusion and stenosis of bilateral carotid arteries: Secondary | ICD-10-CM

## 2023-08-15 NOTE — Patient Instructions (Signed)
Medication Instructions:  No changes *If you need a refill on your cardiac medications before your next appointment, please call your pharmacy*   Lab Work: none If you have labs (blood work) drawn today and your tests are completely normal, you will receive your results only by: MyChart Message (if you have MyChart) OR A paper copy in the mail If you have any lab test that is abnormal or we need to change your treatment, we will call you to review the results.   Testing/Procedures: none   Follow-Up: At [facility], you and your health needs are our priority.  As part of our continuing mission to provide you with exceptional heart care, we have created designated Provider Care Teams.  These Care Teams include your primary Cardiologist (physician) and Advanced Practice Providers (APPs -  Physician Assistants and Nurse Practitioners) who all work together to provide you with the care you need, when you need it.   Your next appointment:   12 month(s)  Provider:   Verne Carrow, MD     Other Instructions

## 2023-09-02 ENCOUNTER — Other Ambulatory Visit: Payer: Self-pay | Admitting: Cardiovascular Disease

## 2023-10-20 ENCOUNTER — Other Ambulatory Visit: Payer: Self-pay | Admitting: Cardiovascular Disease

## 2023-10-22 ENCOUNTER — Other Ambulatory Visit: Payer: Self-pay

## 2023-11-05 ENCOUNTER — Other Ambulatory Visit: Payer: Self-pay

## 2023-11-05 MED ORDER — ISOSORBIDE MONONITRATE ER 30 MG PO TB24: 15.0000 mg | ORAL_TABLET | Freq: Every day | ORAL | 3 refills | Status: AC

## 2023-12-30 ENCOUNTER — Emergency Department (HOSPITAL_COMMUNITY)

## 2023-12-30 ENCOUNTER — Inpatient Hospital Stay (HOSPITAL_COMMUNITY)
Admission: EM | Admit: 2023-12-30 | Discharge: 2024-01-03 | DRG: 871 | Disposition: A | Discharge status: Home Health | Attending: Internal Medicine | Admitting: Internal Medicine

## 2023-12-30 ENCOUNTER — Encounter (HOSPITAL_COMMUNITY): Payer: Self-pay | Admitting: Emergency Medicine

## 2023-12-30 DIAGNOSIS — E222 Syndrome of inappropriate secretion of antidiuretic hormone: Secondary | ICD-10-CM | POA: Diagnosis present

## 2023-12-30 DIAGNOSIS — Z951 Presence of aortocoronary bypass graft: Secondary | ICD-10-CM

## 2023-12-30 DIAGNOSIS — I130 Hypertensive heart and chronic kidney disease with heart failure and stage 1 through stage 4 chronic kidney disease, or unspecified chronic kidney disease: Secondary | ICD-10-CM | POA: Diagnosis present

## 2023-12-30 DIAGNOSIS — A419 Sepsis, unspecified organism: Principal | ICD-10-CM | POA: Diagnosis present

## 2023-12-30 DIAGNOSIS — T502X5A Adverse effect of carbonic-anhydrase inhibitors, benzothiadiazides and other diuretics, initial encounter: Secondary | ICD-10-CM | POA: Diagnosis present

## 2023-12-30 DIAGNOSIS — E1122 Type 2 diabetes mellitus with diabetic chronic kidney disease: Secondary | ICD-10-CM | POA: Diagnosis present

## 2023-12-30 DIAGNOSIS — K219 Gastro-esophageal reflux disease without esophagitis: Secondary | ICD-10-CM | POA: Diagnosis present

## 2023-12-30 DIAGNOSIS — E1142 Type 2 diabetes mellitus with diabetic polyneuropathy: Secondary | ICD-10-CM | POA: Diagnosis present

## 2023-12-30 DIAGNOSIS — I2510 Atherosclerotic heart disease of native coronary artery without angina pectoris: Secondary | ICD-10-CM | POA: Diagnosis present

## 2023-12-30 DIAGNOSIS — I5022 Chronic systolic (congestive) heart failure: Secondary | ICD-10-CM | POA: Diagnosis present

## 2023-12-30 DIAGNOSIS — E876 Hypokalemia: Secondary | ICD-10-CM | POA: Diagnosis not present

## 2023-12-30 DIAGNOSIS — N1831 Chronic kidney disease, stage 3a: Secondary | ICD-10-CM | POA: Diagnosis present

## 2023-12-30 DIAGNOSIS — E861 Hypovolemia: Secondary | ICD-10-CM | POA: Diagnosis present

## 2023-12-30 DIAGNOSIS — Z7984 Long term (current) use of oral hypoglycemic drugs: Secondary | ICD-10-CM | POA: Diagnosis not present

## 2023-12-30 DIAGNOSIS — I25810 Atherosclerosis of coronary artery bypass graft(s) without angina pectoris: Secondary | ICD-10-CM | POA: Diagnosis present

## 2023-12-30 DIAGNOSIS — Z7902 Long term (current) use of antithrombotics/antiplatelets: Secondary | ICD-10-CM | POA: Diagnosis not present

## 2023-12-30 DIAGNOSIS — J440 Chronic obstructive pulmonary disease with acute lower respiratory infection: Secondary | ICD-10-CM | POA: Diagnosis present

## 2023-12-30 DIAGNOSIS — Z1152 Encounter for screening for COVID-19: Secondary | ICD-10-CM

## 2023-12-30 DIAGNOSIS — Z79899 Other long term (current) drug therapy: Secondary | ICD-10-CM | POA: Diagnosis not present

## 2023-12-30 DIAGNOSIS — Z66 Do not resuscitate: Secondary | ICD-10-CM | POA: Diagnosis present

## 2023-12-30 DIAGNOSIS — I4891 Unspecified atrial fibrillation: Secondary | ICD-10-CM | POA: Diagnosis present

## 2023-12-30 DIAGNOSIS — J189 Pneumonia, unspecified organism: Principal | ICD-10-CM

## 2023-12-30 DIAGNOSIS — E7800 Pure hypercholesterolemia, unspecified: Secondary | ICD-10-CM | POA: Diagnosis present

## 2023-12-30 DIAGNOSIS — Z955 Presence of coronary angioplasty implant and graft: Secondary | ICD-10-CM

## 2023-12-30 DIAGNOSIS — N189 Chronic kidney disease, unspecified: Secondary | ICD-10-CM | POA: Diagnosis not present

## 2023-12-30 DIAGNOSIS — R7401 Elevation of levels of liver transaminase levels: Secondary | ICD-10-CM | POA: Diagnosis not present

## 2023-12-30 DIAGNOSIS — R7881 Bacteremia: Secondary | ICD-10-CM | POA: Diagnosis not present

## 2023-12-30 DIAGNOSIS — R443 Hallucinations, unspecified: Secondary | ICD-10-CM | POA: Diagnosis not present

## 2023-12-30 DIAGNOSIS — Z7982 Long term (current) use of aspirin: Secondary | ICD-10-CM

## 2023-12-30 DIAGNOSIS — R0902 Hypoxemia: Secondary | ICD-10-CM

## 2023-12-30 DIAGNOSIS — R531 Weakness: Secondary | ICD-10-CM

## 2023-12-30 LAB — CBC WITH DIFFERENTIAL/PLATELET
Abs Immature Granulocytes: 0.1 10*3/uL — ABNORMAL HIGH (ref 0.00–0.07)
Basophils Absolute: 0 10*3/uL (ref 0.0–0.1)
Basophils Relative: 0 %
Eosinophils Absolute: 0 10*3/uL (ref 0.0–0.5)
Eosinophils Relative: 0 %
HCT: 39.3 % (ref 39.0–52.0)
Hemoglobin: 13.5 g/dL (ref 13.0–17.0)
Immature Granulocytes: 1 %
Lymphocytes Relative: 7 %
Lymphs Abs: 0.9 10*3/uL (ref 0.7–4.0)
MCH: 31.2 pg (ref 26.0–34.0)
MCHC: 34.4 g/dL (ref 30.0–36.0)
MCV: 90.8 fL (ref 80.0–100.0)
Monocytes Absolute: 0.7 10*3/uL (ref 0.1–1.0)
Monocytes Relative: 5 %
Neutro Abs: 11.4 10*3/uL — ABNORMAL HIGH (ref 1.7–7.7)
Neutrophils Relative %: 87 %
Platelets: 144 10*3/uL — ABNORMAL LOW (ref 150–400)
RBC: 4.33 MIL/uL (ref 4.22–5.81)
RDW: 12.6 % (ref 11.5–15.5)
WBC: 13 10*3/uL — ABNORMAL HIGH (ref 4.0–10.5)
nRBC: 0 % (ref 0.0–0.2)

## 2023-12-30 LAB — URINALYSIS, W/ REFLEX TO CULTURE (INFECTION SUSPECTED)
Bacteria, UA: NONE SEEN
Bilirubin Urine: NEGATIVE
Glucose, UA: NEGATIVE mg/dL
Hgb urine dipstick: NEGATIVE
Ketones, ur: NEGATIVE mg/dL
Leukocytes,Ua: NEGATIVE
Nitrite: NEGATIVE
Protein, ur: 100 mg/dL — AB
Specific Gravity, Urine: >1.046 — ABNORMAL HIGH (ref 1.005–1.030)
pH: 5 (ref 5.0–8.0)

## 2023-12-30 LAB — BASIC METABOLIC PANEL WITH GFR
Anion gap: 12 (ref 5–15)
Anion gap: 12 (ref 5–15)
BUN: 36 mg/dL — ABNORMAL HIGH (ref 8–23)
BUN: 43 mg/dL — ABNORMAL HIGH (ref 8–23)
CO2: 18 mmol/L — ABNORMAL LOW (ref 22–32)
CO2: 18 mmol/L — ABNORMAL LOW (ref 22–32)
Calcium: 8.1 mg/dL — ABNORMAL LOW (ref 8.9–10.3)
Calcium: 7.9 mg/dL — ABNORMAL LOW (ref 8.9–10.3)
Chloride: 98 mmol/L (ref 98–111)
Chloride: 98 mmol/L (ref 98–111)
Creatinine, Ser: 1.42 mg/dL — ABNORMAL HIGH (ref 0.61–1.24)
Creatinine, Ser: 1.73 mg/dL — ABNORMAL HIGH (ref 0.61–1.24)
GFR, Estimated: 46 mL/min — ABNORMAL LOW (ref >60)
GFR, Estimated: 37 mL/min — ABNORMAL LOW (ref >60)
Glucose, Bld: 156 mg/dL — ABNORMAL HIGH (ref 70–99)
Glucose, Bld: 157 mg/dL — ABNORMAL HIGH (ref 70–99)
Potassium: 3.8 mmol/L (ref 3.5–5.1)
Potassium: 3.6 mmol/L (ref 3.5–5.1)
Sodium: 128 mmol/L — ABNORMAL LOW (ref 135–145)
Sodium: 128 mmol/L — ABNORMAL LOW (ref 135–145)

## 2023-12-30 LAB — COMPREHENSIVE METABOLIC PANEL WITH GFR
ALT: 47 U/L — ABNORMAL HIGH (ref 0–44)
AST: 52 U/L — ABNORMAL HIGH (ref 15–41)
Albumin: 3.6 g/dL (ref 3.5–5.0)
Alkaline Phosphatase: 39 U/L (ref 38–126)
Anion gap: 14 (ref 5–15)
BUN: 35 mg/dL — ABNORMAL HIGH (ref 8–23)
CO2: 21 mmol/L — ABNORMAL LOW (ref 22–32)
Calcium: 9.1 mg/dL (ref 8.9–10.3)
Chloride: 91 mmol/L — ABNORMAL LOW (ref 98–111)
Creatinine, Ser: 1.63 mg/dL — ABNORMAL HIGH (ref 0.61–1.24)
GFR, Estimated: 39 mL/min — ABNORMAL LOW (ref >60)
Glucose, Bld: 194 mg/dL — ABNORMAL HIGH (ref 70–99)
Potassium: 3.9 mmol/L (ref 3.5–5.1)
Sodium: 126 mmol/L — ABNORMAL LOW (ref 135–145)
Total Bilirubin: 1.8 mg/dL — ABNORMAL HIGH (ref 0.0–1.2)
Total Protein: 8.2 g/dL — ABNORMAL HIGH (ref 6.5–8.1)

## 2023-12-30 LAB — LIPASE, BLOOD: Lipase: 31 U/L (ref 11–51)

## 2023-12-30 LAB — TROPONIN I (HIGH SENSITIVITY)
Troponin I (High Sensitivity): 45 ng/L — ABNORMAL HIGH (ref <18)
Troponin I (High Sensitivity): 46 ng/L — ABNORMAL HIGH (ref <18)

## 2023-12-30 LAB — GLUCOSE, CAPILLARY
Glucose-Capillary: 156 mg/dL — ABNORMAL HIGH (ref 70–99)
Glucose-Capillary: 135 mg/dL — ABNORMAL HIGH (ref 70–99)
Glucose-Capillary: 115 mg/dL — ABNORMAL HIGH (ref 70–99)

## 2023-12-30 LAB — I-STAT CG4 LACTIC ACID, ED: Lactic Acid, Venous: 1.9 mmol/L (ref 0.5–1.9)

## 2023-12-30 LAB — RESP PANEL BY RT-PCR (RSV, FLU A&B, COVID)  RVPGX2
Influenza A by PCR: NEGATIVE
Influenza B by PCR: NEGATIVE
Resp Syncytial Virus by PCR: NEGATIVE
SARS Coronavirus 2 by RT PCR: NEGATIVE

## 2023-12-30 LAB — CBG MONITORING, ED: Glucose-Capillary: 198 mg/dL — ABNORMAL HIGH (ref 70–99)

## 2023-12-30 MED ORDER — IBUPROFEN 200 MG PO TABS
400.0000 mg | ORAL_TABLET | Freq: Four times a day (QID) | ORAL | Status: DC | PRN
  Administered 2023-12-30 – 2023-12-31 (×2): 400 mg via ORAL
  Filled 2023-12-30 (×2): qty 2

## 2023-12-30 MED ORDER — INSULIN ASPART 100 UNIT/ML IJ SOLN
0.0000 [IU] | Freq: Every day | INTRAMUSCULAR | Status: DC
  Filled 2023-12-30: qty 0.05

## 2023-12-30 MED ORDER — ONDANSETRON HCL 4 MG/2ML IJ SOLN
4.0000 mg | Freq: Four times a day (QID) | INTRAMUSCULAR | Status: DC | PRN
Start: 2023-12-30 — End: 2024-01-03

## 2023-12-30 MED ORDER — SODIUM CHLORIDE 0.9 % IV SOLN
1.0000 g | INTRAVENOUS | Status: AC
  Administered 2023-12-31 – 2024-01-03 (×4): 1 g via INTRAVENOUS
  Filled 2023-12-30 (×4): qty 10

## 2023-12-30 MED ORDER — SENNOSIDES-DOCUSATE SODIUM 8.6-50 MG PO TABS
1.0000 | ORAL_TABLET | Freq: Every evening | ORAL | Status: DC | PRN
  Administered 2024-01-01 – 2024-01-03 (×2): 1 via ORAL
  Filled 2023-12-30 (×2): qty 1

## 2023-12-30 MED ORDER — ASPIRIN 81 MG PO TBEC
81.0000 mg | DELAYED_RELEASE_TABLET | Freq: Every day | ORAL | Status: DC
Start: 2023-12-30 — End: 2024-01-03
  Administered 2023-12-30 – 2024-01-03 (×5): 81 mg via ORAL
  Filled 2023-12-30 (×5): qty 1

## 2023-12-30 MED ORDER — ACETAMINOPHEN 500 MG PO TABS
1000.0000 mg | ORAL_TABLET | Freq: Once | ORAL | Status: AC
  Administered 2023-12-30: 1000 mg via ORAL
  Filled 2023-12-30: qty 2

## 2023-12-30 MED ORDER — ENOXAPARIN SODIUM 40 MG/0.4ML IJ SOSY
40.0000 mg | PREFILLED_SYRINGE | INTRAMUSCULAR | Status: DC
  Administered 2023-12-30: 40 mg via SUBCUTANEOUS
  Filled 2023-12-30: qty 0.4

## 2023-12-30 MED ORDER — SODIUM CHLORIDE 0.9 % IV SOLN: INTRAVENOUS | Status: AC

## 2023-12-30 MED ORDER — TRAZODONE HCL 50 MG PO TABS
25.0000 mg | ORAL_TABLET | Freq: Every evening | ORAL | Status: DC | PRN
  Administered 2024-01-01 – 2024-01-02 (×2): 25 mg via ORAL
  Filled 2023-12-30 (×2): qty 1

## 2023-12-30 MED ORDER — ONDANSETRON HCL 4 MG PO TABS: 4.0000 mg | ORAL_TABLET | Freq: Four times a day (QID) | ORAL | Status: DC | PRN

## 2023-12-30 MED ORDER — PANTOPRAZOLE SODIUM 20 MG PO TBEC
20.0000 mg | DELAYED_RELEASE_TABLET | Freq: Every day | ORAL | Status: DC
  Administered 2023-12-31 – 2024-01-03 (×4): 20 mg via ORAL
  Filled 2023-12-30 (×4): qty 1

## 2023-12-30 MED ORDER — INSULIN ASPART 100 UNIT/ML IJ SOLN
0.0000 [IU] | Freq: Three times a day (TID) | INTRAMUSCULAR | Status: DC
  Administered 2023-12-30: 2 [IU] via SUBCUTANEOUS
  Administered 2023-12-30 (×2): 3 [IU] via SUBCUTANEOUS
  Administered 2023-12-31: 2 [IU] via SUBCUTANEOUS
  Administered 2024-01-01 – 2024-01-02 (×3): 3 [IU] via SUBCUTANEOUS
  Administered 2024-01-02: 8 [IU] via SUBCUTANEOUS
  Administered 2024-01-02 – 2024-01-03 (×2): 2 [IU] via SUBCUTANEOUS
  Administered 2024-01-03: 3 [IU] via SUBCUTANEOUS
  Filled 2023-12-30: qty 0.15

## 2023-12-30 MED ORDER — CLOPIDOGREL BISULFATE 75 MG PO TABS
75.0000 mg | ORAL_TABLET | Freq: Every day | ORAL | Status: DC
  Administered 2023-12-30 – 2024-01-03 (×5): 75 mg via ORAL
  Filled 2023-12-30 (×5): qty 1

## 2023-12-30 MED ORDER — ISOSORBIDE MONONITRATE ER 30 MG PO TB24
15.0000 mg | ORAL_TABLET | Freq: Every day | ORAL | Status: DC
  Administered 2023-12-30 – 2024-01-03 (×5): 15 mg via ORAL
  Filled 2023-12-30 (×5): qty 1

## 2023-12-30 MED ORDER — SODIUM CHLORIDE 0.9 % IV SOLN
500.0000 mg | Freq: Once | INTRAVENOUS | Status: AC
  Administered 2023-12-30: 500 mg via INTRAVENOUS
  Filled 2023-12-30: qty 5

## 2023-12-30 MED ORDER — AZITHROMYCIN 250 MG PO TABS
500.0000 mg | ORAL_TABLET | Freq: Every day | ORAL | Status: AC
  Administered 2023-12-31 – 2024-01-03 (×4): 500 mg via ORAL
  Filled 2023-12-30 (×4): qty 2

## 2023-12-30 MED ORDER — ONDANSETRON HCL 4 MG/2ML IJ SOLN
4.0000 mg | Freq: Once | INTRAMUSCULAR | Status: AC
  Administered 2023-12-30: 4 mg via INTRAVENOUS
  Filled 2023-12-30: qty 2

## 2023-12-30 MED ORDER — SODIUM CHLORIDE 0.9 % IV BOLUS
500.0000 mL | Freq: Once | INTRAVENOUS | Status: AC
  Administered 2023-12-30: 500 mL via INTRAVENOUS

## 2023-12-30 MED ORDER — SODIUM CHLORIDE 0.9 % IV SOLN: 500.0000 mg | INTRAVENOUS | Status: DC

## 2023-12-30 MED ORDER — ALBUTEROL SULFATE (2.5 MG/3ML) 0.083% IN NEBU: 2.5000 mg | INHALATION_SOLUTION | RESPIRATORY_TRACT | Status: DC | PRN

## 2023-12-30 MED ORDER — IOHEXOL 300 MG/ML  SOLN
80.0000 mL | Freq: Once | INTRAMUSCULAR | Status: AC | PRN
  Administered 2023-12-30: 80 mL via INTRAVENOUS

## 2023-12-30 MED ORDER — SODIUM CHLORIDE 0.9 % IV SOLN
1.0000 g | Freq: Once | INTRAVENOUS | Status: AC
  Administered 2023-12-30: 1 g via INTRAVENOUS
  Filled 2023-12-30: qty 10

## 2023-12-30 NOTE — H&P (Signed)
 History and Physical  William Weber:[id number] DOB: 29-May-1930 DOA: 12/30/2023  PCP: [facility]   Chief Complaint: Weakness, vomiting, cough  HPI: William Weber is a 89 y.o. male with medical history significant for atrial fibrillation, COPD on room air, non-insulin-dependent type 2 diabetes, CAD, hypertension hyperlipidemia admitted to the hospital with sepsis due to community-acquired pneumonia.  History is provided by the patient, he says that for the last week or so, he has been feeling generally weak, over the last couple of days he has had increasing cough with sputum production, as well as some blood streaks in it.  He thinks he might of had some low-grade fevers at home, he also had quite a bit of nausea the last few mornings, and actually vomited a couple of times.  Denies any significant chest pain, shaking chills, diarrhea or other concerns.  Review of Systems: Please see HPI for pertinent positives and negatives. A complete 10 system review of systems are otherwise negative.  Past Medical History:  Diagnosis Date   Anxiety    mild   Atrial fibrillation (HCC)    CAD (coronary artery disease)    DES proximal circumflex January, 2009  /   nuclear, December, 2011, no ischemia, ejection fraction 70% 02/20/19 PCI/DES to SVG-rPDA 03/10/19 PCI/DES x1 to SVG-Diag, DESx2 to SVG to OM   Carotid artery disease (HCC)    Doppler, August, 2011, zero 39% RIC A., 60-79% LICA, stable   Chronic headaches    COPD (chronic obstructive pulmonary disease) (HCC)    Diabetic peripheral neuropathy (HCC) 01/22/2018   Dyslipidemia    statin intolerance   Ejection fraction    EF 70%, nuclear, December, 2011   GERD (gastroesophageal reflux disease)    Hiatal hernia    Hx of CABG    H[place]rickson, 2002   Hypercholesterolemia    Hypertension    MGUS (monoclonal gammopathy of unknown significance) 01/22/2018   Prostate cancer (HCC)    TUR and implant   Rash     2009, etiology not clear, not from Plavix, Claritin helped   Statin intolerance    Past Surgical History:  Procedure Laterality Date   BLADDER SURGERY     CARDIAC SURGERY     CORONARY STENT INTERVENTION N/A 02/19/2019   Procedure: CORONARY STENT INTERVENTION;  Surgeon: Wenona Hamilton, MD;  Location: [facility] INVASIVE CV LAB;  Service: Cardiovascular;  Laterality: N/A;   CORONARY STENT INTERVENTION N/A 03/10/2019   Procedure: CORONARY STENT INTERVENTION;  Surgeon: Odie Benne, MD;  Location: [facility] INVASIVE CV LAB;  Service: Cardiovascular;  Laterality: N/A;   CORONARY STENT INTERVENTION N/A 04/12/2022   Procedure: CORONARY STENT INTERVENTION;  Surgeon: Arnoldo Lapping, MD;  Location: [facility] INVASIVE CV LAB;  Service: Cardiovascular;  Laterality: N/A;   LEFT HEART CATH AND CORS/GRAFTS ANGIOGRAPHY N/A 02/19/2019   Procedure: LEFT HEART CATH AND CORS/GRAFTS ANGIOGRAPHY;  Surgeon: Wenona Hamilton, MD;  Location: [facility] INVASIVE CV LAB;  Service: Cardiovascular;  Laterality: N/A;   LEFT HEART CATH AND CORS/GRAFTS ANGIOGRAPHY N/A 03/10/2019   Procedure: LEFT HEART CATH AND CORS/GRAFTS ANGIOGRAPHY;  Surgeon: Odie Benne, MD;  Location: [facility] INVASIVE CV LAB;  Service: Cardiovascular;  Laterality: N/A;   LEFT HEART CATH AND CORS/GRAFTS ANGIOGRAPHY N/A 04/12/2022   Procedure: LEFT HEART CATH AND CORS/GRAFTS ANGIOGRAPHY;  Surgeon: Arnoldo Lapping, MD;  Location: [facility] INVASIVE CV LAB;  Service: Cardiovascular;  Laterality: N/A;   PROSTATE SURGERY     Social History:  reports that  he has never smoked. He has never used smokeless tobacco. He reports that he does not drink alcohol and does not use drugs.  Allergies  Allergen Reactions   Metformin And Related Diarrhea and Other (See Comments)    flatulence   Allopurinol Other (See Comments)    Painful skin nodules   Percocet [Oxycodone-Acetaminophen] Other (See Comments)    Dizziness and hallucinations    Rabeprazole Sodium Other (See Comments)     Unknown reaction   Statins Other (See Comments)    Hmg-Coa Reductase inhibitors, Myalgias (intolerance)    Family History  Problem Relation Age of Onset   Diabetes Sister    Diabetes Brother      Prior to Admission medications   Medication Sig Start Date End Date Taking? Authorizing Provider  Ascorbic Acid (VITAMIN C WITH ROSE HIPS) 1000 MG tablet Take 1,000 mg by mouth daily.    [provider]  aspirin EC 81 MG tablet Take 81 mg by mouth daily.    [provider]  clopidogrel (PLAVIX) 75 MG tablet TAKE 1 TABLET BY MOUTH EVERY DAY WITH BREAKFAST 09/03/23   Odie Benne, MD  ezetimibe (ZETIA) 10 MG tablet TAKE 1 TABLET(10 MG) BY MOUTH DAILY 10/23/23   Odie Benne, MD  glipiZIDE (GLUCOTROL XL) 2.5 MG 24 hr tablet Take 2.5 mg by mouth daily.    [provider]  isosorbide mononitrate (IMDUR) 30 MG 24 hr tablet Take 0.5 tablets (15 mg total) by mouth daily. 11/05/23   Odie Benne, MD  lansoprazole (PREVACID) 15 MG capsule Take 15 mg by mouth daily as needed (heartburn).    [provider]  nitroGLYCERIN (NITROSTAT) 0.4 MG SL tablet Place 1 tablet (0.4 mg total) under the tongue every 5 (five) minutes x 3 doses as needed for chest pain. Patient not taking: Reported on 08/15/2023 03/11/19   Sanjuanita Cruz, NP  Omega-3 Fatty Acids (FISH OIL) 1200 MG CAPS Take 1,200-2,400 mg by mouth See admin instructions. Take 2400 mg in the morning and 1200 mg in the evening    [provider]  OVER THE COUNTER MEDICATION Apply 1 Application topically daily as needed (pain). neuropathy frankincense and myrrh topical pain reliever Patient not taking: Reported on 08/15/2023    [provider]  Polyethyl Glycol-Propyl Glycol (SYSTANE OP) Place 1 drop into both eyes daily as needed (dry eyes).    [provider]  triamterene-hydrochlorothiazide (DYAZIDE) 37.5-25 MG capsule TAKE 1 CAPSULE BY MOUTH DAILY FOR BLOOD  PRESSURE 11/26/19   [provider]  vitamin B-12 (CYANOCOBALAMIN) 1000 MCG tablet Take 1,000 mcg by mouth 2 (two) times daily.    [provider]    Physical Exam: BP 116/66 (BP Location: Right Arm)   Pulse 92   Temp 98.3 F (36.8 C) (Oral)   Resp (!) 23   Ht 5\' 9"  (1.753 m)   Wt 81 kg   SpO2 97%   BMI 26.37 kg/m  General:  Alert, oriented, calm, in no acute distress, resting comfortably on my arrival, speaking in full sentences, looks much younger than his stated age and nontoxic Cardiovascular: RRR, no murmurs or rubs, no peripheral edema  Respiratory: clear to auscultation bilaterally, no wheezes, no crackles  Abdomen: soft, nontender, nondistended, normal bowel tones heard  Skin: dry, no rashes  Musculoskeletal: no joint effusions, normal range of motion  Psychiatric: appropriate affect, normal speech  Neurologic: extraocular muscles intact, clear speech, moving all extremities with intact sensorium  Labs on Admission:  Basic Metabolic Panel: Recent Labs  Lab 12/30/23 0429  NA 126*  K 3.9  CL 91*  CO2 21*  GLUCOSE 194*  BUN 35*  CREATININE 1.63*  CALCIUM 9.1   Liver Function Tests: Recent Labs  Lab 12/30/23 0429  AST 52*  ALT 47*  ALKPHOS 39  BILITOT 1.8*  PROT 8.2*  ALBUMIN 3.6   Recent Labs  Lab 12/30/23 0429  LIPASE 31   No results for input(s): "AMMONIA" in the last 168 hours. CBC: Recent Labs  Lab 12/30/23 0429  WBC 13.0*  NEUTROABS 11.4*  HGB 13.5  HCT 39.3  MCV 90.8  PLT 144*   Cardiac Enzymes: No results for input(s): "CKTOTAL", "CKMB", "CKMBINDEX", "TROPONINI" in the last 168 hours. BNP (last 3 results) No results for input(s): "BNP" in the last 8760 hours.  ProBNP (last 3 results) No results for input(s): "PROBNP" in the last 8760 hours.  CBG: No results for input(s): "GLUCAP" in the last 168 hours.  Radiological Exams on Admission: CT ABDOMEN PELVIS W CONTRAST Result Date: 12/30/2023 CLINICAL DATA:   Weakness with cough and nausea for 1 week. EXAM: CT ABDOMEN AND PELVIS WITH CONTRAST TECHNIQUE: Multidetector CT imaging of the abdomen and pelvis was performed using the standard protocol following bolus administration of intravenous contrast. RADIATION DOSE REDUCTION: This exam was performed according to the departmental dose-optimization program which includes automated exposure control, adjustment of the mA and/or kV according to patient size and/or use of iterative reconstruction technique. CONTRAST:  80mL OMNIPAQUE IOHEXOL 300 MG/ML  SOLN COMPARISON:  08/30/2006 FINDINGS: Lower chest: Focal consolidative opacity in the left lower lobe is consistent with pneumonia. Hepatobiliary: No suspicious focal abnormality within the liver parenchyma. Nodular liver contour is consistent with cirrhosis. There is no evidence for gallstones, gallbladder wall thickening, or pericholecystic fluid. No intrahepatic or extrahepatic biliary dilation. Pancreas: No focal mass lesion. No dilatation of the main duct. No intraparenchymal cyst. No peripancreatic edema. Spleen: No splenomegaly. No suspicious focal mass lesion. Adrenals/Urinary Tract: No adrenal nodule or mass. Right kidney unremarkable. Tiny well-defined homogeneous low-density lesion in the left kidney is too small to characterize but is statistically most likely benign and probably a cyst. No followup imaging is recommended. No evidence for hydroureter. The urinary bladder appears normal for the degree of distention. Stomach/Bowel: Stomach is unremarkable. No gastric wall thickening. No evidence of outlet obstruction. Duodenum is normally positioned as is the ligament of Treitz. No small bowel wall thickening. No small bowel dilatation. The terminal ileum is normal. The appendix is normal. No gross colonic mass. No colonic wall thickening. Diverticular changes are noted in the left colon without evidence of diverticulitis. Vascular/Lymphatic: There is moderate  atherosclerotic calcification of the abdominal aorta without aneurysm. There is no gastrohepatic or hepatoduodenal ligament lymphadenopathy. No retroperitoneal or mesenteric lymphadenopathy. No pelvic sidewall lymphadenopathy. Reproductive: Brachytherapy seeds are seen in the prostate parenchyma. Other: No intraperitoneal free fluid. Musculoskeletal: Small bilateral groin hernias contain only fat. Scattered tiny sclerotic foci in the right femoral head, lumbar spine and bony pelvis are stable since the 2007 exam consistent with benign etiology such as bone islands. IMPRESSION: 1. Focal consolidative opacity in the left lower lobe is consistent with pneumonia. 2. Nodular liver contour consistent with cirrhosis. No suspicious focal liver lesion. 3. Left colonic diverticulosis without diverticulitis. 4. Small bilateral groin hernias contain only fat. 5.  Aortic Atherosclerois (ICD10-170.0) Electronically Signed   By: Donnal Fusi [name]   On: 12/30/2023 06:25  DG Chest Portable 1 View Result Date: 12/30/2023 CLINICAL DATA:  Cough and fever EXAM: PORTABLE CHEST 1 VIEW COMPARISON:  02/18/2019 FINDINGS: Normal heart size and mediastinal contours. Prior CABG. No acute infiltrate or edema. No effusion or pneumothorax. No acute osseous findings. Extensive artifact from EKG leads. IMPRESSION: No evidence of acute disease. Electronically Signed   By: Ronnette Coke [name]   On: 12/30/2023 05:16   Assessment/Plan William Weber is a 89 y.o. male with medical history significant for atrial fibrillation, COPD on room air, non-insulin-dependent type 2 diabetes, CAD, hypertension hyperlipidemia admitted to the hospital with sepsis due to community-acquired pneumonia.  Sepsis due to community-acquired pneumonia-meeting criteria with tachycardia, leukocytosis, source is community-acquired pneumonia.  No evidence of endorgan dysfunction.  Patient is hemodynamically stable, responding well to therapy. -Inpatient  admission -Empiric IV azithromycin and IV Rocephin -Follow-up blood cultures  Hyponatremia-suspect that this is most likely a hypovolemic hyponatremia given his recent reduced oral intake, and vomiting.  Also likely exacerbated by his hydrochlorothiazide, could be SIADH as well given his pulmonary infection.  Currently asymptomatic. -Hold hydrochlorothiazide -Hydrate with normal saline -Monitor sodium levels every 8 hours -If not improving may need fluid restriction  CKD stage III-renal function appears to be at baseline, will monitor with daily labs  Hypertension-continue Imdur, will hold his HCTZ in the setting of hyponatremia  Type 2 diabetes-carb modified diet when eating, with sliding scale insulin.  Hold home oral medications, and update hemoglobin A1c  CAD-continue home ASA and Plavix  Atrial fibrillation-known to be in rate controlled atrial fibrillation, not on any medications.  In the setting of sepsis, will monitor closely on cardiac telemetry.  GERD-Protonix  DVT prophylaxis: Lovenox     Code Status: Full Code  Consults called: None  Admission status: The appropriate patient status for this patient is INPATIENT. Inpatient status is judged to be reasonable and necessary in order to provide the required intensity of service to ensure the patient's safety. The patient's presenting symptoms, physical exam findings, and initial radiographic and laboratory data in the context of their chronic comorbidities is felt to place them at high risk for further clinical deterioration. Furthermore, it is not anticipated that the patient will be medically stable for discharge from the hospital within 2 midnights of admission.    I certify that at the point of admission it is my clinical judgment that the patient will require inpatient hospital care spanning beyond 2 midnights from the point of admission due to high intensity of service, high risk for further deterioration and high frequency  of surveillance required  Time spent: 59 minutes  [place] Espina Rickey Charm MD [facility] Pager [phone]  If 7PM-7AM, please contact night-coverage [email or web address] Password [facility] [place]  12/30/2023, 7:36 AM

## 2023-12-30 NOTE — ED Provider Notes (Signed)
 Emergency Department Provider Note   I have reviewed the triage vital signs and the nursing notes.   HISTORY  Chief Complaint Weakness   HPI William Weber is a 89 y.o. male with past history of A-fib, CAD, COPD presents to the emergency department for evaluation of generalized weakness, cough, nausea 2 episodes of vomiting this morning.  He is having some associated left lower quadrant abdominal discomfort and question some streaks of blood in the emesis.  No severe chest pain or shortness of breath although has had cough.  No significant nasal congestion.  No severe headache.  He has noticed some similar symptoms over the past week along with some intermittent confusion.  He typically lives at home alone and manages with some occasional family assistance.  EMS arrived on scene to find him tachycardic with borderline fever.  No meds given prior to arrival.   Past Medical History:  Diagnosis Date   Anxiety    mild   Atrial fibrillation (HCC)    CAD (coronary artery disease)    DES proximal circumflex January, 2009  /   nuclear, December, 2011, no ischemia, ejection fraction 70% 02/20/19 PCI/DES to SVG-rPDA 03/10/19 PCI/DES x1 to SVG-Diag, DESx2 to SVG to OM   Carotid artery disease (HCC)    Doppler, August, 2011, zero 39% RIC A., 60-79% LICA, stable   Chronic headaches    COPD (chronic obstructive pulmonary disease) (HCC)    Diabetic peripheral neuropathy (HCC) 01/22/2018   Dyslipidemia    statin intolerance   Ejection fraction    EF 70%, nuclear, December, 2011   GERD (gastroesophageal reflux disease)    Hiatal hernia    Hx of CABG    Hendrickson, 2002   Hypercholesterolemia    Hypertension    MGUS (monoclonal gammopathy of unknown significance) 01/22/2018   Prostate cancer (HCC)    TUR and implant   Rash    2009, etiology not clear, not from Plavix, Claritin helped   Statin intolerance     Review of Systems  Constitutional: Positive fever/chills and generalized  weakness.  Cardiovascular: Denies chest pain. Respiratory: Denies shortness of breath. Positive cough.  Gastrointestinal: Positive abdominal pain. Positive nausea and vomiting.  No diarrhea.  No constipation. Genitourinary: Negative for dysuria. Patient with urgency.  Musculoskeletal: Negative for back pain. Skin: Negative for rash. Neurological: Negative for headaches.  ____________________________________________   PHYSICAL EXAM:  VITAL SIGNS: ED Triage Vitals  Encounter Vitals Group     BP 12/30/23 0418 (!) 175/84     Pulse Rate 12/30/23 0418 (!) 123     Resp 12/30/23 0418 (!) 22     Temp 12/30/23 0418 100.2 F (37.9 C)     Temp Source 12/30/23 0418 Oral     SpO2 12/30/23 0418 99 %     Weight 12/30/23 0421 178 lb 9.2 oz (81 kg)     Height 12/30/23 0421 5\' 9"  (1.753 m)   Constitutional: Alert and oriented. Well appearing and in no acute distress. Eyes: Conjunctivae are normal.  Head: Atraumatic. Nose: No congestion/rhinnorhea. Mouth/Throat: Mucous membranes are moist.  Oropharynx non-erythematous. Neck: No stridor.   Cardiovascular: Tachycardia. Good peripheral circulation. Grossly normal heart sounds.   Respiratory: Normal respiratory effort.  No retractions. Lungs CTAB. Gastrointestinal: Soft and nontender. No distention.  Musculoskeletal: No lower extremity tenderness nor edema. No gross deformities of extremities. Neurologic:  Normal speech and language. No gross focal neurologic deficits are appreciated.  Skin:  Skin is warm, dry and intact. No  rash noted.  ____________________________________________   LABS (all labs ordered are listed, but only abnormal results are displayed)  Labs Reviewed  COMPREHENSIVE METABOLIC PANEL WITH GFR - Abnormal; Notable for the following components:      Result Value   Sodium 126 (*)    Chloride 91 (*)    CO2 21 (*)    Glucose, Bld 194 (*)    BUN 35 (*)    Creatinine, Ser 1.63 (*)    Total Protein 8.2 (*)    AST 52 (*)     ALT 47 (*)    Total Bilirubin 1.8 (*)    GFR, Estimated 39 (*)    All other components within normal limits  CBC WITH DIFFERENTIAL/PLATELET - Abnormal; Notable for the following components:   WBC 13.0 (*)    Platelets 144 (*)    Neutro Abs 11.4 (*)    Abs Immature Granulocytes 0.10 (*)    All other components within normal limits  TROPONIN I (HIGH SENSITIVITY) - Abnormal; Notable for the following components:   Troponin I (High Sensitivity) 45 (*)    All other components within normal limits  RESP PANEL BY RT-PCR (RSV, FLU A&B, COVID)  RVPGX2  CULTURE, BLOOD (ROUTINE X 2)  CULTURE, BLOOD (ROUTINE X 2)  URINE CULTURE  LIPASE, BLOOD  URINALYSIS, W/ REFLEX TO CULTURE (INFECTION SUSPECTED)  I-STAT CG4 LACTIC ACID, ED  I-STAT CG4 LACTIC ACID, ED  TROPONIN I (HIGH SENSITIVITY)   ____________________________________________  EKG   EKG Interpretation Date/Time:  Sunday December 30 2023 04:21:03 EDT Ventricular Rate:  125 PR Interval:    QRS Duration:  92 QT Interval:  294 QTC Calculation: 424 R Axis:   15  Text Interpretation: Atrial fibrillation Ventricular premature complex Borderline repolarization abnormality Confirmed by Abby Hocking [phone]) on 12/30/2023 4:42:18 AM        ____________________________________________  RADIOLOGY  CT ABDOMEN PELVIS W CONTRAST Result Date: 12/30/2023 CLINICAL DATA:  Weakness with cough and nausea for 1 week. EXAM: CT ABDOMEN AND PELVIS WITH CONTRAST TECHNIQUE: Multidetector CT imaging of the abdomen and pelvis was performed using the standard protocol following bolus administration of intravenous contrast. RADIATION DOSE REDUCTION: This exam was performed according to the departmental dose-optimization program which includes automated exposure control, adjustment of the mA and/or kV according to patient size and/or use of iterative reconstruction technique. CONTRAST:  80mL OMNIPAQUE IOHEXOL 300 MG/ML  SOLN COMPARISON:  08/30/2006 FINDINGS: Lower  chest: Focal consolidative opacity in the left lower lobe is consistent with pneumonia. Hepatobiliary: No suspicious focal abnormality within the liver parenchyma. Nodular liver contour is consistent with cirrhosis. There is no evidence for gallstones, gallbladder wall thickening, or pericholecystic fluid. No intrahepatic or extrahepatic biliary dilation. Pancreas: No focal mass lesion. No dilatation of the main duct. No intraparenchymal cyst. No peripancreatic edema. Spleen: No splenomegaly. No suspicious focal mass lesion. Adrenals/Urinary Tract: No adrenal nodule or mass. Right kidney unremarkable. Tiny well-defined homogeneous low-density lesion in the left kidney is too small to characterize but is statistically most likely benign and probably a cyst. No followup imaging is recommended. No evidence for hydroureter. The urinary bladder appears normal for the degree of distention. Stomach/Bowel: Stomach is unremarkable. No gastric wall thickening. No evidence of outlet obstruction. Duodenum is normally positioned as is the ligament of Treitz. No small bowel wall thickening. No small bowel dilatation. The terminal ileum is normal. The appendix is normal. No gross colonic mass. No colonic wall thickening. Diverticular changes are noted in the left  colon without evidence of diverticulitis. Vascular/Lymphatic: There is moderate atherosclerotic calcification of the abdominal aorta without aneurysm. There is no gastrohepatic or hepatoduodenal ligament lymphadenopathy. No retroperitoneal or mesenteric lymphadenopathy. No pelvic sidewall lymphadenopathy. Reproductive: Brachytherapy seeds are seen in the prostate parenchyma. Other: No intraperitoneal free fluid. Musculoskeletal: Small bilateral groin hernias contain only fat. Scattered tiny sclerotic foci in the right femoral head, lumbar spine and bony pelvis are stable since the 2007 exam consistent with benign etiology such as bone islands. IMPRESSION: 1. Focal  consolidative opacity in the left lower lobe is consistent with pneumonia. 2. Nodular liver contour consistent with cirrhosis. No suspicious focal liver lesion. 3. Left colonic diverticulosis without diverticulitis. 4. Small bilateral groin hernias contain only fat. 5.  Aortic Atherosclerois (ICD10-170.0) Electronically Signed   By: Donnal Fusi [name]   On: 12/30/2023 06:25   DG Chest Portable 1 View Result Date: 12/30/2023 CLINICAL DATA:  Cough and fever EXAM: PORTABLE CHEST 1 VIEW COMPARISON:  02/18/2019 FINDINGS: Normal heart size and mediastinal contours. Prior CABG. No acute infiltrate or edema. No effusion or pneumothorax. No acute osseous findings. Extensive artifact from EKG leads. IMPRESSION: No evidence of acute disease. Electronically Signed   By: Ronnette Coke [name]   On: 12/30/2023 05:16    ____________________________________________   PROCEDURES  Procedure(s) performed:   Procedures  CRITICAL CARE Performed by: Roberts Ching Total critical care time: 35 minutes Critical care time was exclusive of separately billable procedures and treating other patients. Critical care was necessary to treat or prevent imminent or life-threatening deterioration. Critical care was time spent personally by me on the following activities: development of treatment plan with patient and/or surrogate as well as nursing, discussions with consultants, evaluation of patient's response to treatment, examination of patient, obtaining history from patient or surrogate, ordering and performing treatments and interventions, ordering and review of laboratory studies, ordering and review of radiographic studies, pulse oximetry and re-evaluation of patient's condition.  Abby Hocking, MD Emergency Medicine  ____________________________________________   INITIAL IMPRESSION / ASSESSMENT AND PLAN / ED COURSE  Pertinent labs & imaging results that were available during my care of the patient were reviewed by  me and considered in my medical decision making (see chart for details).   This patient is Presenting for Evaluation of abdominal pain, which does require a range of treatment options, and is a complaint that involves a high risk of morbidity and mortality.  The Differential Diagnoses includes but is not exclusive to acute cholecystitis, intrathoracic causes for epigastric abdominal pain, gastritis, duodenitis, pancreatitis, small bowel or large bowel obstruction, abdominal aortic aneurysm, hernia, gastritis, etc.   Critical Interventions-    Medications  cefTRIAXone (ROCEPHIN) 1 g in sodium chloride 0.9 % 100 mL IVPB (1 g Intravenous New Bag/Given 12/30/23 0644)  azithromycin (ZITHROMAX) 500 mg in sodium chloride 0.9 % 250 mL IVPB (has no administration in time range)  sodium chloride 0.9 % bolus 500 mL (500 mLs Intravenous New Bag/Given 12/30/23 0502)  ondansetron (ZOFRAN) injection 4 mg (4 mg Intravenous Given 12/30/23 0454)  acetaminophen (TYLENOL) tablet 1,000 mg (1,000 mg Oral Given 12/30/23 0452)  iohexol (OMNIPAQUE) 300 MG/ML solution 80 mL (80 mLs Intravenous Contrast Given 12/30/23 0532)    Reassessment after intervention: HR improved with IVF and fever reduction.    I did obtain Additional Historical Information from EMS.   I decided to review pertinent External Data, and in summary no recent admissions to our health system.   Clinical Laboratory Tests Ordered, included  CBC with leukocytosis. Mild worsening in kidney function compared to 2023 labs. Mild hyponatremia. Troponin slightly elevated to 45. Will trend. Suspect demand ischemia with A fib RVR and GI losses. Doubt primary ACS.   Radiologic Tests Ordered, included CXR and CT abdomen/pelvis. I independently interpreted the images and agree with radiology interpretation.   Cardiac Monitor Tracing which shows A fib RVR.   Social Determinants of Health Risk patient is a non-smoker.   Consult complete with TR[facility]. Plan to admit.    Medical Decision Making: Summary:  The patient presents emergency department with weakness, borderline fever, cough, vomiting.  Some abdominal pain but minimal tenderness on exam.  Differential is broad as above that includes sepsis but lower suspicion for that at this time.  Patient has mild A-fib RVR but heart rate is minimally elevated into the low 100s at bedside for me.  No hypotension.   Reevaluation with update and discussion with patient and family now at bedside. CT shows a LLL PNA. Suspect this is CAP rather than aspiration with only minimal vomiting this AM. Will start on abx and admit.   Patient's presentation is most consistent with acute presentation with potential threat to life or bodily function.   Disposition: admit  ____________________________________________  FINAL CLINICAL IMPRESSION(S) / ED DIAGNOSES  Final diagnoses:  Community acquired pneumonia of left lower lobe of lung  Hypoxemia  Generalized weakness    Note:  This document was prepared using Dragon voice recognition software and may include unintentional dictation errors.  Abby Hocking, MD, [facility] Emergency Medicine    Denton Derks, Shereen Dike, MD 12/30/23 [phone]

## 2023-12-30 NOTE — ED Notes (Signed)
 Pt belongings bagged and at bedside pt home meds and phone included

## 2023-12-30 NOTE — ED Triage Notes (Signed)
 Pt arrives via EMS from home with generalized weakness, cough, and nausea x1 week. Per EMS family reports concern of vomiting blood.

## 2023-12-31 ENCOUNTER — Other Ambulatory Visit: Payer: Self-pay

## 2023-12-31 DIAGNOSIS — J189 Pneumonia, unspecified organism: Secondary | ICD-10-CM | POA: Diagnosis not present

## 2023-12-31 DIAGNOSIS — A419 Sepsis, unspecified organism: Secondary | ICD-10-CM | POA: Diagnosis not present

## 2023-12-31 LAB — URINE CULTURE: Culture: <10000 — AB

## 2023-12-31 LAB — BLOOD CULTURE ID PANEL (REFLEXED) - BCID2

## 2023-12-31 LAB — GLUCOSE, CAPILLARY
Glucose-Capillary: 116 mg/dL — ABNORMAL HIGH (ref 70–99)
Glucose-Capillary: 136 mg/dL — ABNORMAL HIGH (ref 70–99)
Glucose-Capillary: 110 mg/dL — ABNORMAL HIGH (ref 70–99)
Glucose-Capillary: 106 mg/dL — ABNORMAL HIGH (ref 70–99)

## 2023-12-31 LAB — BASIC METABOLIC PANEL WITH GFR
Anion gap: 13 (ref 5–15)
BUN: 45 mg/dL — ABNORMAL HIGH (ref 8–23)
CO2: 18 mmol/L — ABNORMAL LOW (ref 22–32)
Calcium: 8.1 mg/dL — ABNORMAL LOW (ref 8.9–10.3)
Chloride: 98 mmol/L (ref 98–111)
Creatinine, Ser: 1.25 mg/dL — ABNORMAL HIGH (ref 0.61–1.24)
GFR, Estimated: 54 mL/min — ABNORMAL LOW (ref >60)
Glucose, Bld: 95 mg/dL (ref 70–99)
Potassium: 3.6 mmol/L (ref 3.5–5.1)
Sodium: 129 mmol/L — ABNORMAL LOW (ref 135–145)

## 2023-12-31 LAB — CBC
HCT: 33.9 % — ABNORMAL LOW (ref 39.0–52.0)
Hemoglobin: 11.4 g/dL — ABNORMAL LOW (ref 13.0–17.0)
MCH: 30.9 pg (ref 26.0–34.0)
MCHC: 33.6 g/dL (ref 30.0–36.0)
MCV: 91.9 fL (ref 80.0–100.0)
Platelets: 123 10*3/uL — ABNORMAL LOW (ref 150–400)
RBC: 3.69 MIL/uL — ABNORMAL LOW (ref 4.22–5.81)
RDW: 12.8 % (ref 11.5–15.5)
WBC: 9.2 10*3/uL (ref 4.0–10.5)
nRBC: 0 % (ref 0.0–0.2)

## 2023-12-31 MED ORDER — SALINE SPRAY 0.65 % NA SOLN: 1.0000 | NASAL | Status: DC | PRN

## 2023-12-31 MED ORDER — EZETIMIBE 10 MG PO TABS
10.0000 mg | ORAL_TABLET | Freq: Every day | ORAL | Status: DC
  Administered 2023-12-31 – 2024-01-02 (×3): 10 mg via ORAL
  Filled 2023-12-31 (×3): qty 1

## 2023-12-31 MED ORDER — ENSURE ENLIVE PO LIQD
237.0000 mL | Freq: Two times a day (BID) | ORAL | Status: DC
  Administered 2023-12-31 – 2024-01-03 (×8): 237 mL via ORAL

## 2023-12-31 MED ORDER — PHENOL 1.4 % MT LIQD
1.0000 | OROMUCOSAL | Status: DC | PRN
  Administered 2023-12-31: 1 via OROMUCOSAL
  Filled 2023-12-31: qty 177

## 2023-12-31 MED ORDER — ENOXAPARIN SODIUM 30 MG/0.3ML IJ SOSY
30.0000 mg | PREFILLED_SYRINGE | INTRAMUSCULAR | Status: DC
  Filled 2023-12-31: qty 0.3

## 2023-12-31 MED ORDER — ENOXAPARIN SODIUM 40 MG/0.4ML IJ SOSY
40.0000 mg | PREFILLED_SYRINGE | INTRAMUSCULAR | Status: DC
  Administered 2023-12-31 – 2024-01-02 (×3): 40 mg via SUBCUTANEOUS
  Filled 2023-12-31 (×3): qty 0.4

## 2023-12-31 MED ORDER — GUAIFENESIN-DM 100-10 MG/5ML PO SYRP
5.0000 mL | ORAL_SOLUTION | ORAL | Status: DC | PRN
  Administered 2023-12-31 (×2): 5 mL via ORAL
  Filled 2023-12-31 (×2): qty 5

## 2023-12-31 NOTE — TOC Initial Note (Signed)
 Transition of Care [facility]) - Initial/Assessment Note    Patient Details  Name: William Weber MRN: [id number] Date of Birth: 05-10-1930  Transition of [facility]) CM/SW Contact:    Otelia Santee, LCSW Phone Number: 12/31/2023, 2:44 PM  Clinical Narrative:                 Pt from home alone. Pt has cane at home however, reports he typically ambulates independently. Pt still driving. Pt currently recommended for HHPT and rollator. Pt agreeable to both being arranged and does not have preference for agencies used.  TOC will continue to follow and will arrange [facility] and order rollator for pt prior to discharge.  Expected Discharge Plan: Home w Home Health Services Barriers to Discharge: Continued Medical Work up   Patient Goals and CMS Choice Patient states their goals for this hospitalization and ongoing recovery are:: To feel better CMS Medicare.gov Compare Post Acute Care list provided to:: Patient Choice offered to / list presented to : Patient [place] ownership interest in [facility].provided to::  (NA)    Expected Discharge Plan and Services In-house Referral: Clinical Social Work Discharge Planning Services: NA Post Acute Care Choice: Durable Medical Equipment, Home Health Living arrangements for the past 2 months: Single Family Home                                      Prior Living Arrangements/Services Living arrangements for the past 2 months: Single Family Home Lives with:: Self Patient language and need for interpreter reviewed:: Yes Do you feel safe going back to the place where you live?: Yes      Need for Family Participation in Patient Care: No (Comment) Care giver support system in place?: No (comment) Current home services: DME [facility]) Criminal Activity/Legal Involvement Pertinent to Current Situation/Hospitalization: No - Comment as needed  Activities of Daily Living   ADL Screening (condition at time of admission) Independently  performs ADLs?: No Does the patient have a NEW difficulty with bathing/dressing/toileting/self-feeding that is expected to last >3 days?: No Does the patient have a NEW difficulty with getting in/out of bed, walking, or climbing stairs that is expected to last >3 days?: No Does the patient have a NEW difficulty with communication that is expected to last >3 days?: No Is the patient deaf or have difficulty hearing?: No Does the patient have difficulty seeing, even when wearing glasses/contacts?: No Does the patient have difficulty concentrating, remembering, or making decisions?: No  Permission Sought/Granted   Permission granted to share information with : Yes, Verbal Permission Granted     Permission granted to share info w AGENCY: [facility]        Emotional Assessment Appearance:: Appears younger than stated age Attitude/Demeanor/Rapport: Engaged Affect (typically observed): Accepting Orientation: : Oriented to Self, Oriented to Place, Oriented to  Time, Oriented to Situation Alcohol / Substance Use: Not Applicable Psych Involvement: No (comment)  Admission diagnosis:  Hypoxemia [R09.02] Generalized weakness [R53.1] Sepsis due to pneumonia (HCC) [J18.9, A41.9] Community acquired pneumonia of left lower lobe of lung [J18.9] Patient Active Problem List   Diagnosis Date Noted   Sepsis due to pneumonia (HCC) 12/30/2023   Angina pectoris (HCC) 04/12/2022   Coronary artery disease involving native coronary artery with unstable angina pectoris (HCC) 03/07/2019   Type 2 diabetes mellitus with complication, without long-term current use of insulin (HCC) 02/20/2019   Unstable angina (  HCC) 02/18/2019   NSTEMI (non-ST elevated myocardial infarction) (HCC) 02/18/2019   MGUS (monoclonal gammopathy of unknown significance) 01/22/2018   Diabetic peripheral neuropathy (HCC) 01/22/2018   Thrombocytopenia (HCC) 10/21/2013   Elevated serum glucose 10/21/2013   Atrial fibrillation (HCC)    CAD  (coronary artery disease)    Hypertension    Hyperlipidemia    Statin intolerance    COPD (chronic obstructive pulmonary disease) (HCC)    Anxiety    GERD (gastroesophageal reflux disease)    Chronic headaches    Prostate cancer (HCC)    Ejection fraction    S/P CABG (coronary artery bypass graft)    Carotid artery disease (HCC)    Rash    PCP:  [facility]acy:   [id number] - [place], [place] - [address] [place] [address] Phone: [phone] Fax: [phone]  [facility] [address] [place] [address] Phone: [phone] Fax: [phone]  [id number] - [place], [place] - [address] [place] [address] Phone: [phone] Fax: [phone]     Social Drivers of Health (SDOH) Social History: SDOH Screenings   Food Insecurity: No Food Insecurity (12/30/2023)  Housing: Low Risk  (12/30/2023)  Transportation Needs: No Transportation Needs (12/30/2023)  Utilities: Not At Risk (12/30/2023)  Social Connections: Socially Isolated (12/30/2023)  Tobacco Use: Low Risk  (12/30/2023)   SDOH Interventions: Social Connections Interventions: Inpatient TOC, Intervention Not Indicated   Readmission Risk Interventions    12/31/2023    2:42 PM  Readmission Risk Prevention Plan  Transportation Screening Complete  PCP or Specialist Appt within 5-7 Days Complete  Home Care Screening Complete  Medication Review (RN CM) Complete

## 2023-12-31 NOTE — Progress Notes (Signed)
 PHARMACY - PHYSICIAN COMMUNICATION CRITICAL VALUE ALERT - BLOOD CULTURE IDENTIFICATION (BCID)  William MENDIZABAL is an 89 y.o. male who presented to [facility] on 12/30/2023 with sepsis due to CAP  Assessment: BCID + Staph species in 1/4 bottles  Name of physician (or Provider) Contacted:  Denece Finger, FNP  Current antibiotics:  Ceftriaxone, Azithromycin  Changes to prescribed antibiotics recommended:  Patient is on recommended antibiotics - No changes needed  Results for orders placed or performed during the hospital encounter of 12/30/23  Blood Culture ID Panel (Reflexed) (Collected: 12/30/2023  5:01 AM)  Result Value Ref Range   Enterococcus faecalis NOT DETECTED NOT DETECTED   Enterococcus Faecium NOT DETECTED NOT DETECTED   Listeria monocytogenes NOT DETECTED NOT DETECTED   Staphylococcus species DETECTED (A) NOT DETECTED   Staphylococcus aureus (BCID) NOT DETECTED NOT DETECTED   Staphylococcus epidermidis NOT DETECTED NOT DETECTED   Staphylococcus lugdunensis NOT DETECTED NOT DETECTED   Streptococcus species NOT DETECTED NOT DETECTED   Streptococcus agalactiae NOT DETECTED NOT DETECTED   Streptococcus pneumoniae NOT DETECTED NOT DETECTED   Streptococcus pyogenes NOT DETECTED NOT DETECTED   A.calcoaceticus-baumannii NOT DETECTED NOT DETECTED   Bacteroides fragilis NOT DETECTED NOT DETECTED   Enterobacterales NOT DETECTED NOT DETECTED   Enterobacter cloacae complex NOT DETECTED NOT DETECTED   Escherichia coli NOT DETECTED NOT DETECTED   Klebsiella aerogenes NOT DETECTED NOT DETECTED   Klebsiella oxytoca NOT DETECTED NOT DETECTED   Klebsiella pneumoniae NOT DETECTED NOT DETECTED   Proteus species NOT DETECTED NOT DETECTED   Salmonella species NOT DETECTED NOT DETECTED   Serratia marcescens NOT DETECTED NOT DETECTED   Haemophilus influenzae NOT DETECTED NOT DETECTED   Neisseria meningitidis NOT DETECTED NOT DETECTED   Pseudomonas aeruginosa NOT DETECTED NOT DETECTED    Stenotrophomonas maltophilia NOT DETECTED NOT DETECTED   Candida albicans NOT DETECTED NOT DETECTED   Candida auris NOT DETECTED NOT DETECTED   Candida glabrata NOT DETECTED NOT DETECTED   Candida krusei NOT DETECTED NOT DETECTED   Candida parapsilosis NOT DETECTED NOT DETECTED   Candida tropicalis NOT DETECTED NOT DETECTED   Cryptococcus neoformans/gattii NOT DETECTED NOT DETECTED    Rulon Councilman, PharmD 12/31/2023  6:02 AM

## 2023-12-31 NOTE — Evaluation (Addendum)
 Physical Therapy Evaluation Patient Details Name: William Weber MRN: [id number] DOB: [birth year] Today's Date: 12/31/2023  History of Present Illness  89 y.o. male admitted with sepsis due to community-acquired pneumonia. Pt with medical history significant for atrial fibrillation, COPD on room air, non-insulin-dependent type 2 diabetes, CAD, hypertension hyperlipidemia  Clinical Impression  Pt admitted with above diagnosis. Pt reports he's walks with a cane as needed, and is independent with ADL, yardwork, and driving at baseline. He notes recent decline in balance, strength, and activity tolerance. Pt ambulated 12' with RW, SpO2 92% on room air, ambulation distance limited by fatigue. Encouraged pt to perform seated BUE/LE exercises while up in the recliner to minimize deconditioning. Good progress expected.  Pt currently with functional limitations due to the deficits listed below (see PT Problem List). Pt will benefit from acute skilled PT to increase their independence and safety with mobility to allow discharge.           If plan is discharge home, recommend the following: Help with stairs or ramp for entrance;Assistance with cooking/housework   Can travel by private Data processing manager (4 wheels)  Recommendations for Other Services       Functional Status Assessment Patient has had a recent decline in their functional status and demonstrates the ability to make significant improvements in function in a reasonable and predictable amount of time.     Precautions / Restrictions Precautions Precautions: Fall Recall of Precautions/Restrictions: Intact Precaution/Restrictions Comments: denies falls in past 6 months but reports feeling unsteady for several months Restrictions Weight Bearing Restrictions Per Provider Order: No      Mobility  Bed Mobility               General bed mobility comments: sitting at EOB    Transfers Overall  transfer level: Needs assistance Equipment used: Rolling walker (2 wheels) Transfers: Sit to/from Stand Sit to Stand: Contact guard assist           General transfer comment: VCs hand placement    Ambulation/Gait Ambulation/Gait assistance: Contact guard assist Gait Distance (Feet): 12 Feet Assistive device: Rolling walker (2 wheels) Gait Pattern/deviations: Step-through pattern, Decreased stride length Gait velocity: decr     General Gait Details: distance limited by fatigue, SpO2 92% on room air with activity, HR 106-130 at rest (RN reports pt has afib at baseline), no loss of balance  Stairs            Wheelchair Mobility     Tilt Bed    Modified Rankin (Stroke Patients Only)       Balance Overall balance assessment: Needs assistance   Sitting balance-Leahy Scale: Good     Standing balance support: Bilateral upper extremity supported, During functional activity, Reliant on assistive device for balance Standing balance-Leahy Scale: Fair                               Pertinent Vitals/Pain Pain Assessment Pain Assessment: No/denies pain    Home Living Family/patient expects to be discharged to:: Private residence Living Arrangements: Alone Available Help at Discharge: Family;Available PRN/intermittently Type of Home: House Home Access: Stairs to enter Entrance Stairs-Rails: Can reach both;Right;Left Entrance Stairs-Number of Steps: 7   Home Layout: One level Home Equipment: Cane - single point      Prior Function Prior Level of Function : Independent/Modified Independent;Driving  Mobility Comments: walks with SPC as needed, denies falls in past 6 months, reports unsteadiness for past couple months, was doing his own yard work ADLs Comments: independent     Extremity/Trunk Assessment   Upper Extremity Assessment Upper Extremity Assessment: Defer to OT evaluation    Lower Extremity Assessment Lower Extremity  Assessment: RLE deficits/detail;LLE deficits/detail RLE Deficits / Details: knee ext +4/5 RLE Sensation: history of peripheral neuropathy;decreased light touch LLE Deficits / Details: knee ext +4/5 LLE Sensation: history of peripheral neuropathy;decreased light touch    Cervical / Trunk Assessment Cervical / Trunk Assessment: Kyphotic  Communication   Communication Communication: No apparent difficulties    Cognition Arousal: Alert Behavior During Therapy: WFL for tasks assessed/performed   PT - Cognitive impairments: No apparent impairments                         Following commands: Intact       Cueing       General Comments General comments (skin integrity, edema, etc.): SpO2 97% on room air at rest, 92% on room air with activity    Exercises  Demonstrated LAQs, seated marching, ankle pumps and shoulder flexion to be done independently. Pt ready to eat breakfast at end of session so did not perform the exercises this session.   Assessment/Plan    PT Assessment Patient needs continued PT services  PT Problem List Decreased activity tolerance;Decreased mobility;Decreased balance       PT Treatment Interventions Gait training;Therapeutic exercise;Functional mobility training;Therapeutic activities    PT Goals (Current goals can be found in the Care Plan section)  Acute Rehab PT Goals Patient Stated Goal: return to independence, be able to spread mulch in the yard PT Goal Formulation: With patient Time For Goal Achievement: 01/14/24 Potential to Achieve Goals: Good    Frequency Min 3X/week     Co-evaluation               AM-PAC PT "6 Clicks" Mobility  Outcome Measure Help needed turning from your back to your side while in a flat bed without using bedrails?: None Help needed moving from lying on your back to sitting on the side of a flat bed without using bedrails?: A Little Help needed moving to and from a bed to a chair (including a  wheelchair)?: A Little Help needed standing up from a chair using your arms (e.g., wheelchair or bedside chair)?: A Little Help needed to walk in hospital room?: A Little Help needed climbing 3-5 steps with a railing? : A Little 6 Click Score: 19    End of Session Equipment Utilized During Treatment: Gait belt Activity Tolerance: Patient tolerated treatment well Patient left: in chair;with call bell/phone within reach Nurse Communication: Mobility status PT Visit Diagnosis: Difficulty in walking, not elsewhere classified (R26.2)    Time: 7829-5621 PT Time Calculation (min) (ACUTE ONLY): 19 min   Charges:   PT Evaluation $PT Eval Moderate Complexity: 1 Mod   PT General Charges $$ ACUTE PT VISIT: 1 Visit       Daymon Evans PT 12/31/2023  Acute Rehabilitation Services  Office [phone]

## 2023-12-31 NOTE — Hospital Course (Signed)
 89yo with h/o afib, COPD, DM, CAD, HTN, and HLD who presented on 4/13 with cough and weakness.  He was found to have sepsis due to PNA and BCID shows staph bacteremia.  He was started on Ceftriaxone and Azithromycin.

## 2023-12-31 NOTE — Consult Note (Signed)
 [facility]                                                                                        Patient Identification: Patient Name: William Weber MRN: [id number] Admit Date: 12/30/2023  4:10 AM Today's Date: 12/31/2023 Reason for consult: positive blood cultures  Requesting provider: Dr Ophelia Charter   Principal Problem:   Sepsis due to pneumonia [facility])   Antibiotics:  Azithromycin 4/13- Ceftriaxone 4/12-c  Lines/Hardware:  Assessment # CAP - strep pneumo ag negative  # Positive blood cultures ( GPC in 1 sets and GPR in another set, BCID as staph spp) - likely a contaminant   # CKD - cr improving    Recommendations  - continue CAP tx with ceftriaxone and azithromycin - Fu legionella, respiratory cx - Fu blood cx - Universal/standard isolation precautions   Rest of the management as per the primary team. Please call with questions or concerns.  Thank you for the consult  __________________________________________________________________________________________________________ HPI and Hospital Course: 89 Y O male with PMH of A fib, CAD s/p CABG,  Carotid artery disease, HTN, HLD, prostate ca, GERD/Hiatal hernia, COPD who presented to ED 4/13 with generalized weakness, cough, nausea for a week including few episodes of vomiting on day of arrival. Associated symptoms include LLQ abdominal pain with question of streaks of blood in vomitus On EMS arrival febrile and tachycardic.   At ED T Max 100.2. labs remarkable for Na 126, Cr elevated to 1.63, AST 52, ALT 47, TB 1.8, Troponin 45, WBC 13, platelets 144. Influenza A, B, RSV, SARScov2 negative UA not suggestive of infection  Chest xray unremarkable  CT abdomen pelvis  Focal consolidative opacity in the left lower lobe is consistent with pneumonia. Nodular liver contour consistent with cirrhosis. No suspicious focal liver  lesion. Left colonic diverticulosis without diverticulitis. Small bilateral groin hernias contain only fat. Aortic Atherosclerois    ROS: General- Denies fever, chills, loss of appetite and loss of weight HEENT - Denies headache, blurry vision, neck pain, sinus pain Chest - Denies any chest pain, SOB or cough CVS- Denies any dizziness/lightheadedness, syncopal attacks, palpitations Abdomen- Denies any nausea, vomiting, abdominal pain, hematochezia and diarrhea Neuro - Denies any weakness, numbness, tingling sensation Psych - Denies any changes in mood irritability or depressive symptoms GU- Denies any burning, dysuria, hematuria or increased frequency of urination Skin - denies any rashes/lesions MSK - denies any joint pain/swelling or restricted ROM   Past Medical History:  Diagnosis Date   Anxiety    mild   Atrial fibrillation (HCC)    CAD (coronary artery disease)    DES proximal circumflex January, 2009  /   nuclear, December, 2011, no ischemia, ejection fraction 70% 02/20/19 PCI/DES to SVG-rPDA 03/10/19 PCI/DES x1 to SVG-Diag, DESx2 to SVG to OM   Carotid artery disease (HCC)    Doppler, August, 2011, zero 39% RIC A., 60-79% LICA, stable   Chronic headaches    COPD (chronic obstructive pulmonary disease) (HCC)    Diabetic peripheral neuropathy (HCC) 01/22/2018   Dyslipidemia    statin intolerance   Ejection fraction  EF 70%, nuclear, December, 2011   GERD (gastroesophageal reflux disease)    Hiatal hernia    Hx of CABG    Dorris Fetch, 2002   Hypercholesterolemia    Hypertension    MGUS (monoclonal gammopathy of unknown significance) 01/22/2018   Prostate cancer (HCC)    TUR and implant   Rash    2009, etiology not clear, not from Plavix, Claritin helped   Statin intolerance    Past Surgical History:  Procedure Laterality Date   BLADDER SURGERY     CARDIAC SURGERY     CORONARY STENT INTERVENTION N/A 02/19/2019   Procedure: CORONARY STENT INTERVENTION;  Surgeon: Iran Ouch, MD;  Location: [facility] INVASIVE CV LAB;  Service: Cardiovascular;  Laterality: N/A;   CORONARY STENT INTERVENTION N/A 03/10/2019   Procedure: CORONARY STENT INTERVENTION;  Surgeon: Kathleene Hazel, MD;  Location: [facility] INVASIVE CV LAB;  Service: Cardiovascular;  Laterality: N/A;   CORONARY STENT INTERVENTION N/A 04/12/2022   Procedure: CORONARY STENT INTERVENTION;  Surgeon: Tonny Bollman, MD;  Location[facility] INVASIVE CV LAB;  Service: Cardiovascular;  Laterality: N/A;   LEFT HEART CATH AND CORS/GRAFTS ANGIOGRAPHY N/A 02/19/2019   Procedure: LEFT HEART CATH AND CORS/GRAFTS ANGIOGRAPHY;  Surgeon: Iran Ouch, MD;  Location: [facility]VASIVE CV LAB;  Service: Cardiovascular;  Laterality: N/A;   LEFT HEART CATH AND CORS/GRAFTS ANGIOGRAPHY N/A 03/10/2019   Procedure: LEFT HEART CATH AND CORS/GRAFTS ANGIOGRAPHY;  Surgeon: Kathleene Hazel, MD;  Location: [facility];  Service: Cardiovascular;  Laterality: N/A;   LEFT HEART CATH AND CORS/GRAFTS ANGIOGRAPHY N/A 04/12/2022   Procedure: LEFT HEART CATH AND CORS/GRAFTS ANGIOGRAPHY;  Surgeon: Tonny Bollman, MD;  Location: [facility] INVASIVE CV LAB;  Service: Cardiovascular;  Laterality: N/A;   PROSTATE SURGERY     Scheduled Meds:  aspirin EC  81 mg Oral Daily   azithromycin  500 mg Oral Daily   clopidogrel  75 mg Oral Daily   enoxaparin (LOVENOX) injection  30 mg Subcutaneous Q24H   feeding supplement  237 mL Oral BID BM   insulin aspart  0-15 Units Subcutaneous TID WC   insulin aspart  0-5 Units Subcutaneous QHS   isosorbide mononitrate  15 mg Oral Daily   pantoprazole  20 mg Oral Daily   Continuous Infusions:  cefTRIAXone (ROCEPHIN)  IV 1 g (12/31/23 0611)   PRN Meds:.albuterol, guaiFENesin-dextromethorphan, ibuprofen, ondansetron **OR** ondansetron (ZOFRAN) IV, phenol, senna-docusate, sodium chloride, traZODone  Allergies  Allergen Reactions   Metformin And Related Diarrhea and Other (See Comments)    flatulence   Allopurinol  Other (See Comments)    Painful skin nodules   Percocet [Oxycodone-Acetaminophen] Other (See Comments)    Dizziness and hallucinations    Statins Other (See Comments)    Hmg-Coa Reductase inhibitors, Myalgias   Social History   Socioeconomic History   Marital status: Married    Spouse name: Not on file   Number of children: Not on file   Years of education: Not on file   Highest education level: Not on file  Occupational History   Not on file  Tobacco Use   Smoking status: Never   Smokeless tobacco: Never  Vaping Use   Vaping status: Never Used  Substance and Sexual Activity   Alcohol use: No   Drug use: No   Sexual activity: Not on file  Other Topics Concern   Not on file  Social History Narrative   Not on file   Social Drivers of Health  Financial Resource Strain: Not on file  Food Insecurity: No Food Insecurity (12/30/2023)   Hunger Vital Sign    Worried About Running Out of Food in the Last Year: Never true    Ran Out of Food in the Last Year: Never true  Transportation Needs: No Transportation Needs (12/30/2023)   PRAPARE - Administrator, Civil Service (Medical): No    Lack of Transportation (Non-Medical): No  Physical Activity: Not on file  Stress: Not on file  Social Connections: Socially Isolated (12/30/2023)   Social Connection and Isolation Panel [NHANES]    Frequency of Communication with Friends and Family: More than three times a week    Frequency of Social Gatherings with Friends and Family: Twice a week    Attends Religious Services: Never    Database administrator or Organizations: No    Attends Banker Meetings: Never    Marital Status: Widowed  Intimate Partner Violence: Not At Risk (12/30/2023)   Humiliation, Afraid, Rape, and Kick questionnaire    Fear of Current or Ex-Partner: No    Emotionally Abused: No    Physically Abused: No    Sexually Abused: No   Family History  Problem Relation Age of Onset   Diabetes  Sister    Diabetes Brother      Vitals BP 95/67 (BP Location: Right Arm)   Pulse (!) 104   Temp 98.6 F (37 C) (Oral)   Resp 17   Ht 5\' 9"  (1.753 m)   Wt 81.4 kg   SpO2 94%   BMI 26.50 kg/m    Physical Exam Constitutional:  elderly male lying in the bed, non toxic appearing     Comments: HEENT wnl  Cardiovascular:     Rate and Rhythm: Normal rate and Irregular rhythm.     Heart sounds: s1s2  Pulmonary:     Effort: Pulmonary effort is normal.     Comments: Normal breath sounds, basilar rales   Abdominal:     Palpations: Abdomen is soft.     Tenderness: non distended and non tender   Musculoskeletal:        General: No swelling or tenderness in peripheral joints  Skin:    Comments: no rashes   Neurological:     General: awake, alert and oriented, grossly non focal exam   Psychiatric:        Mood and Affect: Mood normal.    Pertinent Microbiology Results for orders placed or performed during the hospital encounter of 12/30/23  Culture, blood (routine x 2)     Status: None (Preliminary result)   Collection Time: 12/30/23  4:30 AM   Specimen: BLOOD  Result Value Ref Range Status   Specimen Description   Final    BLOOD LEFT ANTECUBITAL Performed at [facility], [address] [place], [place] [address]    Special Requests   Final    BOTTLES DRAWN AEROBIC AND ANAEROBIC Blood Culture results may not be optimal due to an inadequate volume of blood received in culture bottles Performed at [facility], [address], [place], [place] [address]    Culture  Setup Time   Final    GRAM POSITIVE RODS ANAEROBIC BOTTLE ONLY CRITICAL RESULT CALLED TO, READ BACK BY AND VERIFIED WITH: PHARMD C.WINDSOR AT 1011 ON 12/31/2023 BY T.SAAD. Performed at [facility], [address], [place], [place] [address]    Culture GRAM POSITIVE RODS  Final  Report Status PENDING  Incomplete  Resp panel by RT-PCR (RSV, Flu A&B, Covid)      Status: None   Collection Time: 12/30/23  4:32 AM   Specimen: Nasal Swab  Result Value Ref Range Status   SARS Coronavirus 2 by RT PCR NEGATIVE NEGATIVE Final    Comment: (NOTE) SARS-CoV-2 target nucleic acids are NOT DETECTED.  The SARS-CoV-2 RNA is generally detectable in upper respiratory specimens during the acute phase of infection. The lowest concentration of SARS-CoV-2 viral copies this assay can detect is 138 copies/mL. A negative result does not preclude SARS-Cov-2 infection and should not be used as the sole basis for treatment or other patient management decisions. A negative result may occur with  improper specimen collection/handling, submission of specimen other than nasopharyngeal swab, presence of viral mutation(s) within the areas targeted by this assay, and inadequate number of viral copies(<138 copies/mL). A negative result must be combined with clinical observations, patient history, and epidemiological information. The expected result is Negative.  Fact Sheet for Patients:  BloggerCourse.com  Fact Sheet for Healthcare Providers:  SeriousBroker.it  This test is no t yet approved or cleared by the [place]  FDA and  has been authorized for detection and/or diagnosis of SARS-CoV-2 by FDA under an Emergency Use Authorization (EUA). This EUA will remain  in effect (meaning this test can be used) for the duration of the COVID-19 declaration under Section 564(b)(1) of the Act, 21 U.S.C.section 360bbb-3(b)(1), unless the authorization is terminated  or revoked sooner.       Influenza A by PCR NEGATIVE NEGATIVE Final   Influenza B by PCR NEGATIVE NEGATIVE Final    Comment: (NOTE) The Xpert Xpress SARS-CoV-2/FLU/RSV plus assay is intended as an aid in the diagnosis of influenza from Nasopharyngeal swab specimens and should not be used as a sole basis for treatment. Nasal washings and aspirates are unacceptable  for Xpert Xpress SARS-CoV-2/FLU/RSV testing.  Fact Sheet for Patients: BloggerCourse.com  Fact Sheet for Healthcare Providers: SeriousBroker.it  This test is not yet approved or cleared by the [place]  FDA and has been authorized for detection and/or diagnosis of SARS-CoV-2 by FDA under an Emergency Use Authorization (EUA). This EUA will remain in effect (meaning this test can be used) for the duration of the COVID-19 declaration under Section 564(b)(1) of the Act, 21 U.S.C. section 360bbb-3(b)(1), unless the authorization is terminated or revoked.     Resp Syncytial Virus by PCR NEGATIVE NEGATIVE Final    Comment: (NOTE) Fact Sheet for Patients: BloggerCourse.com  Fact Sheet for Healthcare Providers: SeriousBroker.it  This test is not yet approved or cleared by the [place]  FDA and has been authorized for detection and/or diagnosis of SARS-CoV-2 by FDA under an Emergency Use Authorization (EUA). This EUA will remain in effect (meaning this test can be used) for the duration of the COVID-19 declaration under Section 564(b)(1) of the Act, 21 U.S.C. section 360bbb-3(b)(1), unless the authorization is terminated or revoked.  Performed at [facility], [address] [place], [place] [address]   Culture, blood (routine x 2)     Status: None (Preliminary result)   Collection Time: 12/30/23  5:01 AM   Specimen: BLOOD  Result Value Ref Range Status   Specimen Description   Final    BLOOD RIGHT ANTECUBITAL Performed at [facility], [address] [place], [place] [address]    Special Requests   Final    BOTTLES DRAWN AEROBIC AND ANAEROBIC Blood Culture results may  not be optimal due to an inadequate volume of blood received in culture bottles Performed at [facility], [address], [place], [place] [address]     Culture  Setup Time   Final    GRAM POSITIVE COCCI IN BOTH AEROBIC AND ANAEROBIC BOTTLES Organism ID to follow CRITICAL RESULT CALLED TO, READ BACK BY AND VERIFIED WITH: L POINDEXTER,PHARMD@[email or web address]  12/31/23 MK Performed at [facility], [address], [place], [place] [address]    Culture GRAM POSITIVE COCCI  Final   Report Status PENDING  Incomplete  Blood Culture ID Panel (Reflexed)     Status: Abnormal   Collection Time: 12/30/23  5:01 AM  Result Value Ref Range Status   Enterococcus faecalis NOT DETECTED NOT DETECTED Final   Enterococcus Faecium NOT DETECTED NOT DETECTED Final   Listeria monocytogenes NOT DETECTED NOT DETECTED Final   Staphylococcus species DETECTED (A) NOT DETECTED Final    Comment: CRITICAL RESULT CALLED TO, READ BACK BY AND VERIFIED WITH: L POINDEXTER,PHARMD@0535  12/31/23 MK    Staphylococcus aureus (BCID) NOT DETECTED NOT DETECTED Final   Staphylococcus epidermidis NOT DETECTED NOT DETECTED Final   Staphylococcus lugdunensis NOT DETECTED NOT DETECTED Final   Streptococcus species NOT DETECTED NOT DETECTED Final   Streptococcus agalactiae NOT DETECTED NOT DETECTED Final   Streptococcus pneumoniae NOT DETECTED NOT DETECTED Final   Streptococcus pyogenes NOT DETECTED NOT DETECTED Final   A.calcoaceticus-baumannii NOT DETECTED NOT DETECTED Final   Bacteroides fragilis NOT DETECTED NOT DETECTED Final   Enterobacterales NOT DETECTED NOT DETECTED Final   Enterobacter cloacae complex NOT DETECTED NOT DETECTED Final   Escherichia coli NOT DETECTED NOT DETECTED Final   Klebsiella aerogenes NOT DETECTED NOT DETECTED Final   Klebsiella oxytoca NOT DETECTED NOT DETECTED Final   Klebsiella pneumoniae NOT DETECTED NOT DETECTED Final   Proteus species NOT DETECTED NOT DETECTED Final   Salmonella species NOT DETECTED NOT DETECTED Final   Serratia marcescens NOT DETECTED NOT DETECTED Final   Haemophilus influenzae NOT DETECTED NOT DETECTED Final   Neisseria  meningitidis NOT DETECTED NOT DETECTED Final   Pseudomonas aeruginosa NOT DETECTED NOT DETECTED Final   Stenotrophomonas maltophilia NOT DETECTED NOT DETECTED Final   Candida albicans NOT DETECTED NOT DETECTED Final   Candida auris NOT DETECTED NOT DETECTED Final   Candida glabrata NOT DETECTED NOT DETECTED Final   Candida krusei NOT DETECTED NOT DETECTED Final   Candida parapsilosis NOT DETECTED NOT DETECTED Final   Candida tropicalis NOT DETECTED NOT DETECTED Final   Cryptococcus neoformans/gattii NOT DETECTED NOT DETECTED Final    Comment: Performed at [facility], [address] [place], [place] [address]  Urine Culture     Status: Abnormal   Collection Time: 12/30/23  8:19 AM   Specimen: Urine, Clean Catch  Result Value Ref Range Status   Specimen Description   Final    URINE, CLEAN CATCH Performed at [facility], [address] [place], [place] [address]    Special Requests   Final    NONE Performed at [facility], [address] [place], [place] [address]    Culture (A)  Final    <10,000 COLONIES/mL INSIGNIFICANT GROWTH Performed at [facility], [address], [place], [place] [address]    Report Status 12/31/2023 FINAL  Final    Pertinent Lab seen by me:    Latest Ref Rng & Units 12/31/2023    5:24 AM 12/30/2023    4:29  AM 04/13/2022    2:04 AM  CBC  WBC 4.0 - 10.5 K/uL 9.2  13.0  4.4   Hemoglobin 13.0 - 17.0 g/dL 16.1  09.6  04.5   Hematocrit 39.0 - 52.0 % 33.9  39.3  34.4   Platelets 150 - 400 K/uL 123  144  103       Latest Ref Rng & Units 12/31/2023    5:24 AM 12/30/2023    7:42 PM 12/30/2023   12:07 PM  CMP  Glucose 70 - 99 mg/dL 95  409  811   BUN 8 - 23 mg/dL 45  43  36   Creatinine 0.61 - 1.24 mg/dL 9.14  7.82  9.56   Sodium 135 - 145 mmol/L 129  128  128   Potassium 3.5 - 5.1 mmol/L 3.6  3.6  3.8   Chloride 98 - 111 mmol/L 98  98  98   CO2 22 - 32 mmol/L 18  18  18    Calcium 8.9 - 10.3 mg/dL  8.1  7.9  8.1     Pertinent Imagings/Other Imagings Plain films and CT images have been personally visualized and interpreted; radiology reports have been reviewed. Decision making incorporated into the Impression / Recommendations.  CT ABDOMEN PELVIS W CONTRAST Result Date: 12/30/2023 CLINICAL DATA:  Weakness with cough and nausea for 1 week. EXAM: CT ABDOMEN AND PELVIS WITH CONTRAST TECHNIQUE: Multidetector CT imaging of the abdomen and pelvis was performed using the standard protocol following bolus administration of intravenous contrast. RADIATION DOSE REDUCTION: This exam was performed according to the departmental dose-optimization program which includes automated exposure control, adjustment of the mA and/or kV according to patient size and/or use of iterative reconstruction technique. CONTRAST:  80mL OMNIPAQUE IOHEXOL 300 MG/ML  SOLN COMPARISON:  08/30/2006 FINDINGS: Lower chest: Focal consolidative opacity in the left lower lobe is consistent with pneumonia. Hepatobiliary: No suspicious focal abnormality within the liver parenchyma. Nodular liver contour is consistent with cirrhosis. There is no evidence for gallstones, gallbladder wall thickening, or pericholecystic fluid. No intrahepatic or extrahepatic biliary dilation. Pancreas: No focal mass lesion. No dilatation of the main duct. No intraparenchymal cyst. No peripancreatic edema. Spleen: No splenomegaly. No suspicious focal mass lesion. Adrenals/Urinary Tract: No adrenal nodule or mass. Right kidney unremarkable. Tiny well-defined homogeneous low-density lesion in the left kidney is too small to characterize but is statistically most likely benign and probably a cyst. No followup imaging is recommended. No evidence for hydroureter. The urinary bladder appears normal for the degree of distention. Stomach/Bowel: Stomach is unremarkable. No gastric wall thickening. No evidence of outlet obstruction. Duodenum is normally positioned as is the  ligament of Treitz. No small bowel wall thickening. No small bowel dilatation. The terminal ileum is normal. The appendix is normal. No gross colonic mass. No colonic wall thickening. Diverticular changes are noted in the left colon without evidence of diverticulitis. Vascular/Lymphatic: There is moderate atherosclerotic calcification of the abdominal aorta without aneurysm. There is no gastrohepatic or hepatoduodenal ligament lymphadenopathy. No retroperitoneal or mesenteric lymphadenopathy. No pelvic sidewall lymphadenopathy. Reproductive: Brachytherapy seeds are seen in the prostate parenchyma. Other: No intraperitoneal free fluid. Musculoskeletal: Small bilateral groin hernias contain only fat. Scattered tiny sclerotic foci in the right femoral head, lumbar spine and bony pelvis are stable since the 2007 exam consistent with benign etiology such as bone islands. IMPRESSION: 1. Focal consolidative opacity in the left lower lobe is consistent with pneumonia. 2. Nodular liver contour consistent with cirrhosis. No suspicious focal liver  lesion. 3. Left colonic diverticulosis without diverticulitis. 4. Small bilateral groin hernias contain only fat. 5.  Aortic Atherosclerois (ICD10-170.0) Electronically Signed   By: Donnal Fusi [name]   On: 12/30/2023 06:25   DG Chest Portable 1 View Result Date: 12/30/2023 CLINICAL DATA:  Cough and fever EXAM: PORTABLE CHEST 1 VIEW COMPARISON:  02/18/2019 FINDINGS: Normal heart size and mediastinal contours. Prior CABG. No acute infiltrate or edema. No effusion or pneumothorax. No acute osseous findings. Extensive artifact from EKG leads. IMPRESSION: No evidence of acute disease. Electronically Signed   By: Ronnette Coke [name]   On: 12/30/2023 05:16    I have personally spent 84 minutes involved in face-to-face and non-face-to-face activities for this patient on the day of the visit. Professional time spent includes the following activities: Preparing to see the patient  (review of tests), Obtaining and/or reviewing separately obtained history (admission/discharge record), Performing a medically appropriate examination and/or evaluation , Ordering medications/tests/procedures, referring and communicating with other health care professionals, Documenting clinical information in the EMR, Independently interpreting results (not separately reported), Communicating results to the patient/family/caregiver, Counseling and educating the patient/family/caregiver and Care coordination (not separately reported).  Electronically signed by:   Plan d/w requesting provider as well as ID pharm D  Of note, portions of this note may have been created with voice recognition software. While this note has been edited for accuracy, occasional wrong-word or 'sound-a-like' substitutions may have occurred due to the inherent limitations of voice recognition software.   Terre Ferri, MD Infectious Disease Physician [facility] Pager: [phone]

## 2023-12-31 NOTE — Progress Notes (Addendum)
 Progress Note   Patient: William Weber ZOX:[id number] DOB: 12/15/1929 DOA: 12/30/2023     1 DOS: the patient was seen and examined on 12/31/2023   Brief hospital course: 89yo with h/o afib, COPD, DM, CAD, HTN, and HLD who presented on 4/13 with cough and weakness.  He was found to have sepsis due to PNA and BCID shows staph bacteremia.  He was started on Ceftriaxone and Azithromycin.  Assessment and Plan:  Sepsis due to community-acquired pneumonia with Staph bacteremia Met sepsis criteria with tachycardia, leukocytosis, source is community-acquired pneumonia No evidence of endorgan dysfunction Patient is hemodynamically stable, responding well to therapy Inpatient admission Empiric IV azithromycin and IV Rocephin Will need repeat blood cultures to ensure clearance Echo ordered, may need TEE ID consulted   Hyponatremia Likely hypovolemic hyponatremia given his recent reduced oral intake, and vomiting Also likely exacerbated by his hydrochlorothiazide, could be SIADH as well given his pulmonary infection Currently asymptomatic Hold Dyazide Hold further IVF Will recheck in AM   CKD stage IIIa Renal function appears to be at baseline Recheck BMP in AM Avoid nephrotoxic agents if possible (Advil discontinued)   Hypertension Continue Imdur Hold Dyazide in the setting of hyponatremia   Type 2 diabetes Carb modified diet  Moderate-scale sliding scale insulin Hold glipizide, consider whether sulfonylurea is a good option given its propensity for hypoglycemia in the elderly   CAD Continue ASA and Plavix   Atrial fibrillation Rate controlled without medications Not on [facility]   GERD Continue PPI  DNR I have discussed code status with the patient and he would prefer to die a natural death should that situation arise. Patient will need a gold out of facility DNR form at the time of discharge     Consultants: None  Procedures: None  Antibiotics: Ceftriaxone  4/13- Azithromycin 4/13-  30 Day Unplanned Readmission Risk Score    Flowsheet Row ED to Hosp-Admission (Current) from 12/30/2023 in [facility]  30 Day Unplanned Readmission Risk Score (%) 13.14 Filed at 12/31/2023 0401       This score is the patient's risk of an unplanned readmission within 30 days of being discharged (0 -100%). The score is based on dignosis, age, lab data, medications, orders, and past utilization.   Low:  0-14.9   Medium: 15-21.9   High: 22-29.9   Extreme: 30 and above           Subjective: He is feeling some better.  Still mild SOB, cough.  I spoke with his daughter.  He has had CABG and stents and he is having some deterioration.  He has done very well.  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  He is very independent.  He goes to the gym 5 days a week.  She spent the night with him, she knew he had a fever and he was so weak; she feels like if she had not spent the night she would have found him dead.  He was vomiting blood, talking jibberish.     Objective: Vitals:   12/31/23 1142 12/31/23 1340  BP: 135/76 95/67  Pulse: (!) 104   Resp: 17 17  Temp: (!) 100.9 F (38.3 C) 98.6 F (37 C)  SpO2: 97% 94%    Intake/Output Summary (Last 24 hours) at 12/31/2023 1424 Last data filed  at 12/31/2023 1336 Gross per 24 hour  Intake 390 ml  Output 900 ml  Net -510 ml   Filed Weights   12/30/23 0421 12/30/23 0939  Weight: 81 kg 81.4 kg    Exam:  General:  Appears calm and comfortable and is in NAD, on RA Eyes:  EOMI, normal lids, iris ENT:  grossly normal hearing, lips & tongue, mmm Cardiovascular:  RRR, no m/r/g. No LE edema.  Respiratory:   CTA bilaterally with no wheezes/rales/rhonchi.  Normal respiratory effort. Abdomen:  soft, NT, ND Skin:  no rash or induration seen on  limited exam Musculoskeletal:  grossly normal tone BUE/BLE, good ROM, no bony abnormality Psychiatric:  grossly normal mood and affect, speech fluent and appropriate, AOx3 Neurologic:  CN 2-12 grossly intact, moves all extremities in coordinated fashion   Data Reviewed: I have reviewed the patient's lab results since admission.  Pertinent labs for today include:   Na++ 129 CO2 18 BUN 45/Creatinine 1.25/GFR 54, improved WBC 9.2 Hgb 11.4 Platelets 123     Family Communication: None present; I spoke with his daughter by telephone  Disposition: Status is: Inpatient Remains inpatient appropriate because: ongoing monitoring     Time spent: 50 minutes  Unresulted Labs (From admission, onward)     Start     Ordered   01/01/24 0500  CBC with Differential/Platelet  Tomorrow morning,   R       Question:  Specimen collection method  Answer:  Lab=Lab collect   12/31/23 1421   01/01/24 0500  Basic metabolic panel with GFR  Tomorrow morning,   R       Question:  Specimen collection method  Answer:  Lab=Lab collect   12/31/23 1421   12/31/23 0500  Hemoglobin A1c  (Glycemic Control (SSI)  Q 4 Hours / Glycemic Control (SSI)  AC +/- HS)  Tomorrow morning,   R       Comments: To assess prior glycemic control    12/30/23 0735             Author: Lorita Rosa, MD 12/31/2023 2:24 PM  For on call review [email or web address].

## 2023-12-31 NOTE — Progress Notes (Signed)
 ID brief note  Called for positive blood cx 4/13  1set GPC in both bottles, BCID as staph spp 2nd set GPR in 1/2 bottles   89 Y O male with PMH of A fib, CAD, COPD who presented to ED 4/13 with generalized weakness, cough, nausea, for a week including few episodes of vomiting on day of arrival. CT with concerns for LL PNA  On ceftriaxone and azithromycin for possible CAP  Suspect above blood cx contaminant and unlikely true bacteremia. 2 sets of blood cx ordered, no change in current abtx  Full consult note to follow  Terre Ferri, MD Infectious Disease Physician [facility] [address] [place], [place] [address] Phone: [phone]  Fax: [phone]

## 2023-12-31 NOTE — Plan of Care (Signed)
 Tachycardic to 100s. All other VSS. BG 115 at bedtime. No acute events overnight.  Problem: Fluid Volume: Goal: Ability to maintain a balanced intake and output will improve Outcome: Progressing   Problem: Metabolic: Goal: Ability to maintain appropriate glucose levels will improve Outcome: Progressing   Problem: Nutritional: Goal: Maintenance of adequate nutrition will improve Outcome: Progressing   Problem: Education: Goal: Knowledge of General Education information will improve Description: Including pain rating scale, medication(s)/side effects and non-pharmacologic comfort measures Outcome: Progressing   Problem: Clinical Measurements: Goal: Ability to maintain clinical measurements within normal limits will improve Outcome: Progressing Goal: Will remain free from infection Outcome: Progressing   Problem: Safety: Goal: Ability to remain free from injury will improve Outcome: Progressing

## 2023-12-31 NOTE — Progress Notes (Signed)
 PHARMACY - PHYSICIAN COMMUNICATION CRITICAL VALUE ALERT - BLOOD CULTURE IDENTIFICATION (BCID)  William Weber is an 89 y.o. male who presented to [facility] on 12/30/2023 with a chief complaint of sepsis due to CAP.  Assessment: 1/4 bottles growing GPRs in addition to the previous BCID report (2/4 staph species).  Name of physician (or Provider) Contacted: Dr. Murrel Arnt  Current antibiotics: ceftriaxone and azithromycin  Changes to prescribed antibiotics recommended:  Patient is on recommended antibiotics - No changes needed  Results for orders placed or performed during the hospital encounter of 12/30/23  Blood Culture ID Panel (Reflexed) (Collected: 12/30/2023  5:01 AM)  Result Value Ref Range   Enterococcus faecalis NOT DETECTED NOT DETECTED   Enterococcus Faecium NOT DETECTED NOT DETECTED   Listeria monocytogenes NOT DETECTED NOT DETECTED   Staphylococcus species DETECTED (A) NOT DETECTED   Staphylococcus aureus (BCID) NOT DETECTED NOT DETECTED   Staphylococcus epidermidis NOT DETECTED NOT DETECTED   Staphylococcus lugdunensis NOT DETECTED NOT DETECTED   Streptococcus species NOT DETECTED NOT DETECTED   Streptococcus agalactiae NOT DETECTED NOT DETECTED   Streptococcus pneumoniae NOT DETECTED NOT DETECTED   Streptococcus pyogenes NOT DETECTED NOT DETECTED   A.calcoaceticus-baumannii NOT DETECTED NOT DETECTED   Bacteroides fragilis NOT DETECTED NOT DETECTED   Enterobacterales NOT DETECTED NOT DETECTED   Enterobacter cloacae complex NOT DETECTED NOT DETECTED   Escherichia coli NOT DETECTED NOT DETECTED   Klebsiella aerogenes NOT DETECTED NOT DETECTED   Klebsiella oxytoca NOT DETECTED NOT DETECTED   Klebsiella pneumoniae NOT DETECTED NOT DETECTED   Proteus species NOT DETECTED NOT DETECTED   Salmonella species NOT DETECTED NOT DETECTED   Serratia marcescens NOT DETECTED NOT DETECTED   Haemophilus influenzae NOT DETECTED NOT DETECTED   Neisseria meningitidis NOT DETECTED NOT  DETECTED   Pseudomonas aeruginosa NOT DETECTED NOT DETECTED   Stenotrophomonas maltophilia NOT DETECTED NOT DETECTED   Candida albicans NOT DETECTED NOT DETECTED   Candida auris NOT DETECTED NOT DETECTED   Candida glabrata NOT DETECTED NOT DETECTED   Candida krusei NOT DETECTED NOT DETECTED   Candida parapsilosis NOT DETECTED NOT DETECTED   Candida tropicalis NOT DETECTED NOT DETECTED   Cryptococcus neoformans/gattii NOT DETECTED NOT DETECTED    Roselee Cong, PharmD Clinical Pharmacist  4/14/202510:17 AM

## 2024-01-01 ENCOUNTER — Inpatient Hospital Stay (HOSPITAL_COMMUNITY)

## 2024-01-01 DIAGNOSIS — R7881 Bacteremia: Secondary | ICD-10-CM

## 2024-01-01 DIAGNOSIS — A419 Sepsis, unspecified organism: Secondary | ICD-10-CM | POA: Diagnosis not present

## 2024-01-01 DIAGNOSIS — J189 Pneumonia, unspecified organism: Secondary | ICD-10-CM | POA: Diagnosis not present

## 2024-01-01 DIAGNOSIS — N189 Chronic kidney disease, unspecified: Secondary | ICD-10-CM | POA: Diagnosis not present

## 2024-01-01 LAB — CBC WITH DIFFERENTIAL/PLATELET
Abs Immature Granulocytes: 0.05 10*3/uL (ref 0.00–0.07)
Basophils Absolute: 0 10*3/uL (ref 0.0–0.1)
Basophils Relative: 0 %
Eosinophils Absolute: 0.1 10*3/uL (ref 0.0–0.5)
Eosinophils Relative: 1 %
HCT: 36.6 % — ABNORMAL LOW (ref 39.0–52.0)
Hemoglobin: 12.1 g/dL — ABNORMAL LOW (ref 13.0–17.0)
Immature Granulocytes: 1 %
Lymphocytes Relative: 7 %
Lymphs Abs: 0.6 10*3/uL — ABNORMAL LOW (ref 0.7–4.0)
MCH: 30.9 pg (ref 26.0–34.0)
MCHC: 33.1 g/dL (ref 30.0–36.0)
MCV: 93.6 fL (ref 80.0–100.0)
Monocytes Absolute: 0.5 10*3/uL (ref 0.1–1.0)
Monocytes Relative: 6 %
Neutro Abs: 7.4 10*3/uL (ref 1.7–7.7)
Neutrophils Relative %: 85 %
Platelets: 126 10*3/uL — ABNORMAL LOW (ref 150–400)
RBC: 3.91 MIL/uL — ABNORMAL LOW (ref 4.22–5.81)
RDW: 12.9 % (ref 11.5–15.5)
WBC: 8.7 10*3/uL (ref 4.0–10.5)
nRBC: 0 % (ref 0.0–0.2)

## 2024-01-01 LAB — ECHOCARDIOGRAM COMPLETE
Height: 69 in
S' Lateral: 4 cm
Weight: 2871.27 [oz_av]

## 2024-01-01 LAB — GLUCOSE, CAPILLARY
Glucose-Capillary: 104 mg/dL — ABNORMAL HIGH (ref 70–99)
Glucose-Capillary: 197 mg/dL — ABNORMAL HIGH (ref 70–99)
Glucose-Capillary: 155 mg/dL — ABNORMAL HIGH (ref 70–99)
Glucose-Capillary: 147 mg/dL — ABNORMAL HIGH (ref 70–99)

## 2024-01-01 LAB — BASIC METABOLIC PANEL WITH GFR
Anion gap: 12 (ref 5–15)
BUN: 48 mg/dL — ABNORMAL HIGH (ref 8–23)
CO2: 19 mmol/L — ABNORMAL LOW (ref 22–32)
Calcium: 8.5 mg/dL — ABNORMAL LOW (ref 8.9–10.3)
Chloride: 98 mmol/L (ref 98–111)
Creatinine, Ser: 1.62 mg/dL — ABNORMAL HIGH (ref 0.61–1.24)
GFR, Estimated: 40 mL/min — ABNORMAL LOW (ref >60)
Glucose, Bld: 104 mg/dL — ABNORMAL HIGH (ref 70–99)
Potassium: 3.4 mmol/L — ABNORMAL LOW (ref 3.5–5.1)
Sodium: 129 mmol/L — ABNORMAL LOW (ref 135–145)

## 2024-01-01 LAB — HEMOGLOBIN A1C
Hgb A1c MFr Bld: 7.6 % — ABNORMAL HIGH (ref 4.8–5.6)
Mean Plasma Glucose: 171 mg/dL

## 2024-01-01 LAB — STREP PNEUMONIAE URINARY ANTIGEN: Strep Pneumo Urinary Antigen: NEGATIVE

## 2024-01-01 NOTE — Plan of Care (Addendum)
 VSS. No c/o pain. BG 106 at bedtime. No acute events overnight.  Problem: Education: Goal: Ability to describe self-care measures that may prevent or decrease complications (Diabetes Survival Skills Education) will improve Outcome: Progressing   Problem: Fluid Volume: Goal: Ability to maintain a balanced intake and output will improve Outcome: Progressing   Problem: Metabolic: Goal: Ability to maintain appropriate glucose levels will improve Outcome: Progressing   Problem: Nutritional: Goal: Maintenance of adequate nutrition will improve Outcome: Progressing   Problem: Education: Goal: Knowledge of General Education information will improve Description: Including pain rating scale, medication(s)/side effects and non-pharmacologic comfort measures Outcome: Progressing   Problem: Clinical Measurements: Goal: Ability to maintain clinical measurements within normal limits will improve Outcome: Progressing Goal: Will remain free from infection Outcome: Progressing   Problem: Safety: Goal: Ability to remain free from injury will improve Outcome: Progressing

## 2024-01-01 NOTE — Progress Notes (Signed)
  Echocardiogram 2D Echocardiogram has been performed.  Fain Home RDCS 01/01/2024, 9:52 AM

## 2024-01-01 NOTE — Progress Notes (Signed)
 Physical Therapy Treatment Patient Details Name: William Weber MRN: [id number] DOB: Feb 19, 1930 Today's Date: 01/01/2024   History of Present Illness 89 y.o. male admitted with sepsis due to community-acquired pneumonia. Pt with medical history significant for atrial fibrillation, COPD on room air, non-insulin-dependent type 2 diabetes, CAD, hypertension hyperlipidemia    PT Comments  AxO x 3 very pleasant and motivated who lives home alone. He likes to be active and was spreading mulch (ordered 75 bags) when he became ill.  Feeling tired.  "I though it was just the pollen", stated Pt.  "I though I would be smart and do the mulch early this year cause I can't stand the heat". Assisted OOB to amb went well.  Pt self able to rise with good use of hands to steady self.  General Gait Details: tolerated a functional distance using his new Rollator walker lightly.  Avg RA 97% and HR 74.  No tru dyspnea.  Mild cough.  Pt plans to D/C to home tomorrow    If plan is discharge home, recommend the following: Help with stairs or ramp for entrance;Assistance with cooking/housework   Can travel by Pension scheme manager (4 wheels)    Recommendations for Other Services       Precautions / Restrictions Precautions Precautions: Fall Recall of Precautions/Restrictions: Intact Restrictions Weight Bearing Restrictions Per Provider Order: No     Mobility  Bed Mobility Overal bed mobility: Modified Independent             General bed mobility comments: self able    Transfers Overall transfer level: Needs assistance Equipment used: Rolling walker (2 wheels) Transfers: Sit to/from Stand Sit to Stand: Supervision           General transfer comment: self able with good use of hands to steady self    Ambulation/Gait Ambulation/Gait assistance: Supervision Gait Distance (Feet): 75 Feet Assistive device: Rollator (4 wheels) Gait  Pattern/deviations: Step-through pattern, Decreased stride length Gait velocity: decreased     General Gait Details: tolerated a functional distance using his new Rollator walker lightly.  Avg RA 97% and HR 74.  No tru dyspnea.  Mild cough.  Tolerated well.   Stairs             Wheelchair Mobility     Tilt Bed    Modified Rankin (Stroke Patients Only)       Balance                                            Communication Communication Communication: No apparent difficulties  Cognition Arousal: Alert     PT - Cognitive impairments: No apparent impairments                       PT - Cognition Comments: AxO x 3 very pleasant and motivated who lives home alone and was spreading mulch (ordered 75 bags) when he became ill.  "I though it was just the pollen", stated Pt.  "I though I would be smart and do the mulch early this year cause I can't stand the heat". Following commands: Intact      Cueing    Exercises      General Comments        Pertinent Vitals/Pain Pain Assessment Pain Assessment: No/denies pain  Home Living                          Prior Function            PT Goals (current goals can now be found in the care plan section) Progress towards PT goals: Progressing toward goals    Frequency    Min 3X/week      PT Plan      Co-evaluation              AM-PAC PT "6 Clicks" Mobility   Outcome Measure  Help needed turning from your back to your side while in a flat bed without using bedrails?: None Help needed moving from lying on your back to sitting on the side of a flat bed without using bedrails?: None Help needed moving to and from a bed to a chair (including a wheelchair)?: None Help needed standing up from a chair using your arms (e.g., wheelchair or bedside chair)?: None Help needed to walk in hospital room?: None Help needed climbing 3-5 steps with a railing? : A Little 6 Click  Score: 23    End of Session Equipment Utilized During Treatment: Gait belt Activity Tolerance: Patient tolerated treatment well Patient left: in bed;with call bell/phone within reach Nurse Communication: Mobility status PT Visit Diagnosis: Difficulty in walking, not elsewhere classified (R26.2)     Time: 4098-1191 PT Time Calculation (min) (ACUTE ONLY): 18 min  Charges:    $Gait Training: 8-22 mins PT General Charges $$ ACUTE PT VISIT: 1 Visit                    Bess Broody  PTA Acute  Rehabilitation Services Office M-F          [phone]

## 2024-01-01 NOTE — TOC Progression Note (Signed)
 Transition of Care [facility]) - Progression Note    Patient Details  Name: William Weber MRN: [id number] Date of Birth: 11-Jun-1930  Transition of [facility]) CM/SW Contact  Marty Sleet, LCSW Phone Number: 01/01/2024, 11:24 AM  Clinical Narrative:    Rollator ordered for pt through [employer] and will be delivered to pt's room prior to discharge.   Expected Discharge Plan: Home w Home Health Services Barriers to Discharge: Continued Medical Work up  Expected Discharge Plan and Services In-house Referral: Clinical Social Work Discharge Planning Services: NA Post Acute Care Choice: Durable Medical Equipment, Home Health Living arrangements for the past 2 months: Single Family Home                 DME Arranged: Walker rolling with seat DME Agency: [employer] Date DME Agency Contacted: 01/01/24 Time DME Agency Contacted: 1123 Representative spoke with at DME Agency: April             Social Determinants of Health (SDOH) Interventions SDOH Screenings   Food Insecurity: No Food Insecurity (12/30/2023)  Housing: Low Risk  (12/30/2023)  Transportation Needs: No Transportation Needs (12/30/2023)  Utilities: Not At Risk (12/30/2023)  Social Connections: Socially Isolated (12/30/2023)  Tobacco Use: Low Risk  (12/30/2023)    Readmission Risk Interventions    12/31/2023    2:42 PM  Readmission Risk Prevention Plan  Transportation Screening Complete  PCP or Specialist Appt within 5-7 Days Complete  Home Care Screening Complete  Medication Review (RN CM) Complete

## 2024-01-01 NOTE — Progress Notes (Addendum)
 Progress Note   Patient: William Weber AVW:[id number] DOB: 04-16-1930 DOA: 12/30/2023     2 DOS: the patient was seen and examined on 01/01/2024   Brief hospital course: 89yo with h/o afib, COPD, DM, CAD, HTN, and HLD who presented on 4/13 with cough and weakness.  He was found to have sepsis due to PNA and BCID shows staph bacteremia.  He was started on Ceftriaxone and Azithromycin.  Assessment and Plan:  Sepsis due to community-acquired pneumonia with Staph bacteremia Met sepsis criteria with tachycardia, leukocytosis, source is community-acquired pneumonia No evidence of endorgan dysfunction Patient is hemodynamically stable, responding well to therapy Inpatient admission Empiric IV azithromycin and IV Rocephin ID consulted Repeat blood cultures to ensure clearance NTD, although ID suspects that this is a contaminant Echo negative for vegetations, TEE not recommended by ID at this time Recommend repeat CXR in 4-6 weeks   Hyponatremia Likely hypovolemic hyponatremia given his recent reduced oral intake, and vomiting Also likely exacerbated by his hydrochlorothiazide, could be SIADH as well given his pulmonary infection Currently asymptomatic Stable without IVF or diuretics so possibly chronic Will recheck in AM  Chronic systolic CHF Appears to be compensated Echo 4/15 with EF 40-45% with regional WMA, no vegetations Negative delta on troponins during hospitalization  Last cath in 2023 with recommendation for indefinite DAPT in the setting of degenerated vein graft disease and multiple vein graft stents Recommend outpatient cardiology f/u   CKD stage IIIa Renal function appears to be at baseline Recheck BMP in AM Avoid nephrotoxic agents if possible (Advil discontinued)   Hypertension Continue Imdur Hold Dyazide in the setting of hyponatremia   Type 2 diabetes Carb modified diet  Moderate-scale sliding scale insulin Hold glipizide, consider whether sulfonylurea  is a good option given its propensity for hypoglycemia in the elderly A1c 7.6, which is at goal for his age   CAD Continue ASA and Plavix Statin intolerant, refused Praluent or Repatha Monotherapy with Zetia   Atrial fibrillation Rate controlled without medications Not on [facility]   GERD Continue PPI   DNR I have discussed code status with the patient and he would prefer to die a natural death should that situation arise. Patient will need a gold out of facility DNR form at the time of discharge        Consultants: None   Procedures: None   Antibiotics: Ceftriaxone 4/13- Azithromycin 4/13-  30 Day Unplanned Readmission Risk Score    Flowsheet Row ED to Hosp-Admission (Current) [facility]  30 Day Unplanned Readmission Risk Score (%) 19.73 Filed at 01/01/2024 0801       This score is the patient's risk of an unplanned readmission within 30 days of being discharged (0 -100%). The score is based on dignosis, age, lab data, medications, orders, and past utilization.   Low:  0-14.9   Medium: 15-21.9   High: 22-29.9   Extreme: 30 and above           Subjective: Feeling ok, hasn't been terribly active yet.  No acute concerns.  His daughter reports that he is hallucinating some.   Objective: Vitals:   01/01/24 0457 01/01/24 1152  BP: 131/81 122/70  Pulse: (!) 103 (!) 103  Resp: 19 16  Temp: 98.4 F (36.9 C) 98.4 F (36.9 C)  SpO2: 97% 98%    Intake/Output Summary (Last 24 hours) at 01/01/2024 1535 Last data filed at 01/01/2024 0900 Gross per 24 hour  Intake 0 ml  Output 850 ml  Net -850 ml   Filed Weights   12/30/23 0421 12/30/23 0939  Weight: 81 kg 81.4 kg    Exam:  General:  Appears calm and comfortable and is in NAD, on RA Eyes:  EOMI, normal lids, iris ENT:  grossly normal hearing, lips & tongue, mmm Cardiovascular:  RRR, no m/r/g. No LE edema.  Respiratory:   CTA bilaterally with no  wheezes/rales/rhonchi.  Normal respiratory effort. Abdomen:  soft, NT, ND Skin:  no rash or induration seen on limited exam Musculoskeletal:  grossly normal tone BUE/BLE, good ROM, no bony abnormality Psychiatric:  grossly normal mood and affect, speech fluent and appropriate, AOx3 Neurologic:  CN 2-12 grossly intact, moves all extremities in coordinated fashion  Data Reviewed: I have reviewed the patient's lab results since admission.  Pertinent labs for today include:   Na++ 129, stable K+ 3.4 CO2 19, not clinically significant BUN 48/Creatinine 1.62/GFR 40 WBC 8.7 Hgb 12.1 Platelets 126, stable A1c 7.6    Family Communication: None present; I spoke with his daughter by telephone  Disposition: Status is: Inpatient Remains inpatient appropriate because: ongoing management, possibly home 4/16     Time spent: 35 minutes  Unresulted Labs (From admission, onward)     Start     Ordered   01/02/24 0500  CBC with Differential/Platelet  Tomorrow morning,   R       Question:  Specimen collection method  Answer:  Lab=Lab collect   01/01/24 1535   01/02/24 0500  Basic metabolic panel with GFR  Tomorrow morning,   R       Question:  Specimen collection method  Answer:  Lab=Lab collect   01/01/24 1535   12/31/23 1441  Legionella Pneumophila Serogp 1 Ur Ag  Once,   R        12/31/23 1440   12/31/23 1441  Culture, Respiratory w Gram Stain  Once,   R        12/31/23 1441             Author: Lorita Rosa, MD 01/01/2024 3:35 PM  For on call review [email or web address].

## 2024-01-01 NOTE — Plan of Care (Signed)
  Problem: Health Behavior/Discharge Planning: Goal: Ability to identify and utilize available resources and services will improve Outcome: Progressing   Problem: Metabolic: Goal: Ability to maintain appropriate glucose levels will improve Outcome: Progressing   Problem: Nutritional: Goal: Maintenance of adequate nutrition will improve Outcome: Progressing   Problem: Tissue Perfusion: Goal: Adequacy of tissue perfusion will improve Outcome: Progressing   Problem: Clinical Measurements: Goal: Will remain free from infection Outcome: Progressing   Problem: Nutrition: Goal: Adequate nutrition will be maintained Outcome: Progressing   Problem: Elimination: Goal: Will not experience complications related to bowel motility Outcome: Progressing   Problem: Pain Managment: Goal: General experience of comfort will improve and/or be controlled Outcome: Progressing   Problem: Safety: Goal: Ability to remain free from injury will improve Outcome: Progressing

## 2024-01-02 ENCOUNTER — Inpatient Hospital Stay (HOSPITAL_COMMUNITY)

## 2024-01-02 DIAGNOSIS — A419 Sepsis, unspecified organism: Secondary | ICD-10-CM | POA: Diagnosis not present

## 2024-01-02 DIAGNOSIS — J189 Pneumonia, unspecified organism: Secondary | ICD-10-CM | POA: Diagnosis not present

## 2024-01-02 LAB — CBC WITH DIFFERENTIAL/PLATELET
Abs Immature Granulocytes: 0.11 10*3/uL — ABNORMAL HIGH (ref 0.00–0.07)
Basophils Absolute: 0 10*3/uL (ref 0.0–0.1)
Basophils Relative: 0 %
Eosinophils Absolute: 0.1 10*3/uL (ref 0.0–0.5)
Eosinophils Relative: 2 %
HCT: 31 % — ABNORMAL LOW (ref 39.0–52.0)
Hemoglobin: 10.8 g/dL — ABNORMAL LOW (ref 13.0–17.0)
Immature Granulocytes: 2 %
Lymphocytes Relative: 11 %
Lymphs Abs: 0.8 10*3/uL (ref 0.7–4.0)
MCH: 31.2 pg (ref 26.0–34.0)
MCHC: 34.8 g/dL (ref 30.0–36.0)
MCV: 89.6 fL (ref 80.0–100.0)
Monocytes Absolute: 0.5 10*3/uL (ref 0.1–1.0)
Monocytes Relative: 7 %
Neutro Abs: 5.5 10*3/uL (ref 1.7–7.7)
Neutrophils Relative %: 78 %
Platelets: 132 10*3/uL — ABNORMAL LOW (ref 150–400)
RBC: 3.46 MIL/uL — ABNORMAL LOW (ref 4.22–5.81)
RDW: 12.8 % (ref 11.5–15.5)
WBC: 7 10*3/uL (ref 4.0–10.5)
nRBC: 0 % (ref 0.0–0.2)

## 2024-01-02 LAB — BASIC METABOLIC PANEL WITH GFR
Anion gap: 10 (ref 5–15)
BUN: 40 mg/dL — ABNORMAL HIGH (ref 8–23)
CO2: 21 mmol/L — ABNORMAL LOW (ref 22–32)
Calcium: 8.5 mg/dL — ABNORMAL LOW (ref 8.9–10.3)
Chloride: 98 mmol/L (ref 98–111)
Creatinine, Ser: 1.38 mg/dL — ABNORMAL HIGH (ref 0.61–1.24)
GFR, Estimated: 48 mL/min — ABNORMAL LOW (ref >60)
Glucose, Bld: 150 mg/dL — ABNORMAL HIGH (ref 70–99)
Potassium: 3.1 mmol/L — ABNORMAL LOW (ref 3.5–5.1)
Sodium: 129 mmol/L — ABNORMAL LOW (ref 135–145)

## 2024-01-02 LAB — CULTURE, BLOOD (ROUTINE X 2)
Culture  Setup Time: NO GROWTH
Culture  Setup Time: NO GROWTH

## 2024-01-02 LAB — GLUCOSE, CAPILLARY
Glucose-Capillary: 143 mg/dL — ABNORMAL HIGH (ref 70–99)
Glucose-Capillary: 194 mg/dL — ABNORMAL HIGH (ref 70–99)
Glucose-Capillary: 260 mg/dL — ABNORMAL HIGH (ref 70–99)
Glucose-Capillary: 168 mg/dL — ABNORMAL HIGH (ref 70–99)

## 2024-01-02 LAB — TSH: TSH: 0.887 u[IU]/mL (ref 0.350–4.500)

## 2024-01-02 LAB — LEGIONELLA PNEUMOPHILA SEROGP 1 UR AG: L. pneumophila Serogp 1 Ur Ag: NEGATIVE

## 2024-01-02 LAB — MAGNESIUM: Magnesium: 1.7 mg/dL (ref 1.7–2.4)

## 2024-01-02 MED ORDER — POTASSIUM CHLORIDE CRYS ER 20 MEQ PO TBCR
40.0000 meq | EXTENDED_RELEASE_TABLET | Freq: Once | ORAL | Status: AC
  Administered 2024-01-02: 40 meq via ORAL
  Filled 2024-01-02: qty 2

## 2024-01-02 NOTE — Plan of Care (Signed)
  Problem: Metabolic: Goal: Ability to maintain appropriate glucose levels will improve Outcome: Progressing   Problem: Nutritional: Goal: Maintenance of adequate nutrition will improve Outcome: Progressing   Problem: Skin Integrity: Goal: Risk for impaired skin integrity will decrease Outcome: Progressing   Problem: Nutrition: Goal: Adequate nutrition will be maintained Outcome: Progressing   Problem: Elimination: Goal: Will not experience complications related to bowel motility Outcome: Progressing   Problem: Safety: Goal: Ability to remain free from injury will improve Outcome: Progressing   Problem: Skin Integrity: Goal: Risk for impaired skin integrity will decrease Outcome: Progressing

## 2024-01-02 NOTE — Progress Notes (Signed)
 PROGRESS NOTE    William Weber  EPP:[id number] DOB: [birth year] DOA: 12/30/2023 PCP: [facility]  Brief Narrative: 89yo with h/o afib, COPD, DM, CAD, HTN, and HLD who presented on 4/13 with cough and weakness. He was found to have sepsis due to PNA and BCID shows staph bacteremia. He was started on Ceftriaxone and Azithromycin.   Assessment & Plan:   Principal Problem:   Sepsis due to pneumonia (HCC)  Sepsis due to community-acquired pneumonia with Staph bacteremia -he met criteria for sepsis on admission with leukocytosis tachycardia and community-acquired pneumonia.  There was no evidence of endorgan dysfunction.  He was started on Rocephin and azithromycin.  Blood culture came back positive for Staphylococcus capitis Appreciate ID input Repeat blood cultures pending Echocardiogram shows no vegetations   Hyponatremia remains stable sodium 129 Likely hypovolemic hyponatremia given his recent reduced oral intake, and vomiting Also likely exacerbated by his hydrochlorothiazide, could be SIADH as well given his pulmonary infection Currently asymptomatic Stable without IVF or diuretics so possibly chronic Will recheck in AM   Chronic systolic CHF-stable Echo 4/15 with EF 40-45% with regional WMA, no vegetations Negative delta on troponins during hospitalization  Last cath in 2023 with recommendation for indefinite DAPT in the setting of degenerated vein graft disease and multiple vein graft stents Recommend outpatient cardiology f/u   CKD stage IIIa Renal function appears to be at baseline Recheck BMP in AM Avoid nephrotoxic agents if possible (Advil discontinued)   Hypertension Continue Imdur Hold Dyazide in the setting of hyponatremia   Type 2 diabetes Carb modified diet  Moderate-scale sliding scale insulin Hold glipizide, consider whether sulfonylurea is a good option given its propensity for hypoglycemia in the elderly A1c 7.6, which  is at goal for his age   CAD Continue ASA and Plavix  Atrial fibrillation Rate controlled without medications Not on [facility]   GERD Continue PPI    Hypokalemia repeat potassium 3.1 replete with 40 KCl, check mag level.  Generalized weakness/hallucinations/nausea/lack of appetite-UA negative, TSH 0.88, CT head pending  Estimated body mass index is 26.5 kg/m as calculated from the following:   Height as of this encounter: 5\' 9"  (1.753 m).   Weight as of this encounter: 81.4 kg.  DVT prophylaxis:  Code Status:  Family Communication: Disposition Plan:  Status is: Inpatient Remains inpatient appropriate because:    Consultants:   Procedures:  Antimicrobials:  Subjective:  Daughter reports patient hallucinating nauseated confused at baseline he is very sharp still driving taking care of 6-[place]/6 acres taking care of himself and lives alone now all he does is sleep with nausea and does not eat and has become weak Objective: Vitals:   01/01/24 0457 01/01/24 1152 01/01/24 2131 01/02/24 0255  BP: 131/81 122/70 134/73 133/79  Pulse: (!) 103 (!) 103 (!) 105 97  Resp: 19 16 18 18   Temp: 98.4 F (36.9 C) 98.4 F (36.9 C) 98.5 F (36.9 C) 97.6 F (36.4 C)  TempSrc: Oral     SpO2: 97% 98% 98% 96%  Weight:      Height:        Intake/Output Summary (Last 24 hours) at 01/02/2024 1211 Last data filed at 01/02/2024 0758 Gross per 24 hour  Intake 100.06 ml  Output --  Net 100.06 ml   Filed Weights   12/30/23 0421 12/30/23 0939  Weight: 81 kg 81.4 kg    Examination:  General exam: Appears drowsy Respiratory system: Clear to auscultation. Respiratory effort normal. Cardiovascular  system: irreg Gastrointestinal system: Abdomen is nondistended, soft and nontender. No organomegaly or masses felt. Normal bowel sounds heard. Central nervous system: Very drowsy but able to answer questions appropriately  extremities: No edema   Data Reviewed: I have personally reviewed following  labs and imaging studies  CBC: Recent Labs  Lab 12/30/23 0429 12/31/23 0524 01/01/24 0513 01/02/24 0518  WBC 13.0* 9.2 8.7 7.0  NEUTROABS 11.4*  --  7.4 5.5  HGB 13.5 11.4* 12.1* 10.8*  HCT 39.3 33.9* 36.6* 31.0*  MCV 90.8 91.9 93.6 89.6  PLT 144* 123* 126* 132*   Basic Metabolic Panel: Recent Labs  Lab 12/30/23 1207 12/30/23 1942 12/31/23 0524 01/01/24 0513 01/02/24 0518 01/02/24 1120  NA 128* 128* 129* 129* 129*  --   K 3.8 3.6 3.6 3.4* 3.1*  --   CL 98 98 98 98 98  --   CO2 18* 18* 18* 19* 21*  --   GLUCOSE 156* 157* 95 104* 150*  --   BUN 36* 43* 45* 48* 40*  --   CREATININE 1.42* 1.73* 1.25* 1.62* 1.38*  --   CALCIUM 8.1* 7.9* 8.1* 8.5* 8.5*  --   MG  --   --   --   --   --  1.7   GFR: Estimated Creatinine Clearance: 34.2 mL/min (A) (by C-G formula based on SCr of 1.38 mg/dL (H)). Liver Function Tests: Recent Labs  Lab 12/30/23 0429  AST 52*  ALT 47*  ALKPHOS 39  BILITOT 1.8*  PROT 8.2*  ALBUMIN 3.6   Recent Labs  Lab 12/30/23 0429  LIPASE 31   No results for input(s): "AMMONIA" in the last 168 hours. Coagulation Profile: No results for input(s): "INR", "PROTIME" in the last 168 hours. Cardiac Enzymes: No results for input(s): "CKTOTAL", "CKMB", "CKMBINDEX", "TROPONINI" in the last 168 hours. BNP (last 3 results) No results for input(s): "PROBNP" in the last 8760 hours. HbA1C: Recent Labs    12/31/23 0524  HGBA1C 7.6*   CBG: Recent Labs  Lab 01/01/24 0744 01/01/24 1152 01/01/24 1618 01/01/24 2133 01/02/24 0747  GLUCAP 104* 197* 155* 147* 143*   Lipid Profile: No results for input(s): "CHOL", "HDL", "LDLCALC", "TRIG", "CHOLHDL", "LDLDIRECT" in the last 72 hours. Thyroid Function Tests: No results for input(s): "TSH", "T4TOTAL", "FREET4", "T3FREE", "THYROIDAB" in the last 72 hours. Anemia Panel: No results for input(s): "VITAMINB12", "FOLATE", "FERRITIN", "TIBC", "IRON", "RETICCTPCT" in the last 72 hours. Sepsis Labs: Recent Labs   Lab 12/30/23 0438  LATICACIDVEN 1.9    Recent Results (from the past 240 hours)  Culture, blood (routine x 2)     Status: Abnormal   Collection Time: 12/30/23  4:30 AM   Specimen: BLOOD  Result Value Ref Range Status   Specimen Description   Final    BLOOD LEFT ANTECUBITAL Performed at [facility], [address], [place], [place] [address]    Special Requests   Final    BOTTLES DRAWN AEROBIC AND ANAEROBIC Blood Culture results may not be optimal due to an inadequate volume of blood received in culture bottles Performed at [facility], [address] [place], [place] [address]    Culture  Setup Time   Final    GRAM POSITIVE RODS ANAEROBIC BOTTLE ONLY CRITICAL RESULT CALLED TO, READ BACK BY AND VERIFIED WITH: PHARMD C.WINDSOR AT 1011 ON 12/31/2023 BY T.SAAD.    Culture (A)  Final    CORYNEBACTERIUM MINUTISSIMUM Standardized susceptibility testing for this organism is not  available. Performed at [facility], [address] [place], [place] [address]    Report Status 01/02/2024 FINAL  Final  Resp panel by RT-PCR (RSV, Flu A&B, Covid)     Status: None   Collection Time: 12/30/23  4:32 AM   Specimen: Nasal Swab  Result Value Ref Range Status   SARS Coronavirus 2 by RT PCR NEGATIVE NEGATIVE Final    Comment: (NOTE) SARS-CoV-2 target nucleic acids are NOT DETECTED.  The SARS-CoV-2 RNA is generally detectable in upper respiratory specimens during the acute phase of infection. The lowest concentration of SARS-CoV-2 viral copies this assay can detect is 138 copies/mL. A negative result does not preclude SARS-Cov-2 infection and should not be used as the sole basis for treatment or other patient management decisions. A negative result may occur with  improper specimen collection/handling, submission of specimen other than nasopharyngeal swab, presence of viral mutation(s) within the areas targeted by this assay, and inadequate  number of viral copies(<138 copies/mL). A negative result must be combined with clinical observations, patient history, and epidemiological information. The expected result is Negative.  Fact Sheet for Patients:  BloggerCourse.com  Fact Sheet for Healthcare Providers:  SeriousBroker.it  This test is no t yet approved or cleared by the [place]  FDA and  has been authorized for detection and/or diagnosis of SARS-CoV-2 by FDA under an Emergency Use Authorization (EUA). This EUA will remain  in effect (meaning this test can be used) for the duration of the COVID-19 declaration under Section 564(b)(1) of the Act, 21 U.S.C.section 360bbb-3(b)(1), unless the authorization is terminated  or revoked sooner.       Influenza A by PCR NEGATIVE NEGATIVE Final   Influenza B by PCR NEGATIVE NEGATIVE Final    Comment: (NOTE) The Xpert Xpress SARS-CoV-2/FLU/RSV plus assay is intended as an aid in the diagnosis of influenza from Nasopharyngeal swab specimens and should not be used as a sole basis for treatment. Nasal washings and aspirates are unacceptable for Xpert Xpress SARS-CoV-2/FLU/RSV testing.  Fact Sheet for Patients: BloggerCourse.com  Fact Sheet for Healthcare Providers: SeriousBroker.it  This test is not yet approved or cleared by the [place]  FDA and has been authorized for detection and/or diagnosis of SARS-CoV-2 by FDA under an Emergency Use Authorization (EUA). This EUA will remain in effect (meaning this test can be used) for the duration of the COVID-19 declaration under Section 564(b)(1) of the Act, 21 U.S.C. section 360bbb-3(b)(1), unless the authorization is terminated or revoked.     Resp Syncytial Virus by PCR NEGATIVE NEGATIVE Final    Comment: (NOTE) Fact Sheet for Patients: BloggerCourse.com  Fact Sheet for Healthcare  Providers: SeriousBroker.it  This test is not yet approved or cleared by the [place]  FDA and has been authorized for detection and/or diagnosis of SARS-CoV-2 by FDA under an Emergency Use Authorization (EUA). This EUA will remain in effect (meaning this test can be used) for the duration of the COVID-19 declaration under Section 564(b)(1) of the Act, 21 U.S.C. section 360bbb-3(b)(1), unless the authorization is terminated or revoked.  Performed at [facility], [address] [place], [place] [address]   Culture, blood (routine x 2)     Status: Abnormal   Collection Time: 12/30/23  5:01 AM   Specimen: BLOOD  Result Value Ref Range Status   Specimen Description   Final    BLOOD RIGHT ANTECUBITAL Performed at [facility], [address], [place], [place] [address]    Special  Requests   Final    BOTTLES DRAWN AEROBIC AND ANAEROBIC Blood Culture results may not be optimal due to an inadequate volume of blood received in culture bottles Performed at [facility], [address] [place], [place] [address]    Culture  Setup Time   Final    GRAM POSITIVE COCCI IN BOTH AEROBIC AND ANAEROBIC BOTTLES Organism ID to follow CRITICAL RESULT CALLED TO, READ BACK BY AND VERIFIED WITH: L POINDEXTER,PHARMD@[email or web address]  12/31/23 MK    Culture (A)  Final    STAPHYLOCOCCUS CAPITIS THE SIGNIFICANCE OF ISOLATING THIS ORGANISM FROM A SINGLE SET OF BLOOD CULTURES WHEN MULTIPLE SETS ARE DRAWN IS UNCERTAIN. PLEASE NOTIFY THE MICROBIOLOGY DEPARTMENT WITHIN ONE WEEK IF SPECIATION AND SENSITIVITIES ARE REQUIRED. Performed at [facility], [address], [place], [place] [address]    Report Status 01/02/2024 FINAL  Final  Blood Culture ID Panel (Reflexed)     Status: Abnormal   Collection Time: 12/30/23  5:01 AM  Result Value Ref Range Status   Enterococcus faecalis NOT DETECTED NOT DETECTED Final   Enterococcus  Faecium NOT DETECTED NOT DETECTED Final   Listeria monocytogenes NOT DETECTED NOT DETECTED Final   Staphylococcus species DETECTED (A) NOT DETECTED Final    Comment: CRITICAL RESULT CALLED TO, READ BACK BY AND VERIFIED WITH: L POINDEXTER,PHARMD@0535  12/31/23 MK    Staphylococcus aureus (BCID) NOT DETECTED NOT DETECTED Final   Staphylococcus epidermidis NOT DETECTED NOT DETECTED Final   Staphylococcus lugdunensis NOT DETECTED NOT DETECTED Final   Streptococcus species NOT DETECTED NOT DETECTED Final   Streptococcus agalactiae NOT DETECTED NOT DETECTED Final   Streptococcus pneumoniae NOT DETECTED NOT DETECTED Final   Streptococcus pyogenes NOT DETECTED NOT DETECTED Final   A.calcoaceticus-baumannii NOT DETECTED NOT DETECTED Final   Bacteroides fragilis NOT DETECTED NOT DETECTED Final   Enterobacterales NOT DETECTED NOT DETECTED Final   Enterobacter cloacae complex NOT DETECTED NOT DETECTED Final   Escherichia coli NOT DETECTED NOT DETECTED Final   Klebsiella aerogenes NOT DETECTED NOT DETECTED Final   Klebsiella oxytoca NOT DETECTED NOT DETECTED Final   Klebsiella pneumoniae NOT DETECTED NOT DETECTED Final   Proteus species NOT DETECTED NOT DETECTED Final   Salmonella species NOT DETECTED NOT DETECTED Final   Serratia marcescens NOT DETECTED NOT DETECTED Final   Haemophilus influenzae NOT DETECTED NOT DETECTED Final   Neisseria meningitidis NOT DETECTED NOT DETECTED Final   Pseudomonas aeruginosa NOT DETECTED NOT DETECTED Final   Stenotrophomonas maltophilia NOT DETECTED NOT DETECTED Final   Candida albicans NOT DETECTED NOT DETECTED Final   Candida auris NOT DETECTED NOT DETECTED Final   Candida glabrata NOT DETECTED NOT DETECTED Final   Candida krusei NOT DETECTED NOT DETECTED Final   Candida parapsilosis NOT DETECTED NOT DETECTED Final   Candida tropicalis NOT DETECTED NOT DETECTED Final   Cryptococcus neoformans/gattii NOT DETECTED NOT DETECTED Final    Comment: Performed at  [facility], [address] [place], [place] [address]  Urine Culture     Status: Abnormal   Collection Time: 12/30/23  8:19 AM   Specimen: Urine, Clean Catch  Result Value Ref Range Status   Specimen Description   Final    URINE, CLEAN CATCH Performed at [facility], [address], [place], [place] [address]    Special Requests   Final    NONE Performed at [facility], [address] [place], [place] [address]    Culture (A)  Final    <  10,000 COLONIES/mL INSIGNIFICANT GROWTH Performed at [facility], [address] [place], [place] [address]    Report Status 12/31/2023 FINAL  Final  Culture, blood (Routine X 2) w Reflex to ID Panel     Status: None (Preliminary result)   Collection Time: 12/31/23  2:50 PM   Specimen: BLOOD RIGHT HAND  Result Value Ref Range Status   Specimen Description   Final    BLOOD RIGHT HAND Performed at [facility], [address], [place], [place] [address]    Special Requests   Final    BOTTLES DRAWN AEROBIC AND ANAEROBIC Blood Culture adequate volume Performed at [facility], [address], [place], [place] [address]    Culture   Final    NO GROWTH 2 DAYS Performed at [facility], [address] [place], [place] [address]    Report Status PENDING  Incomplete  Culture, blood (Routine X 2) w Reflex to ID Panel     Status: None (Preliminary result)   Collection Time: 12/31/23  2:52 PM   Specimen: BLOOD LEFT HAND  Result Value Ref Range Status   Specimen Description   Final    BLOOD LEFT HAND Performed at [facility], [address], [place], [place] [address]    Special Requests   Final    BOTTLES DRAWN AEROBIC AND ANAEROBIC Blood Culture adequate volume Performed at [facility], [address], [place], [place] [address]    Culture   Final    NO GROWTH 2 DAYS Performed at [facility], [address],  [place], [place] [address]    Report Status PENDING  Incomplete  Culture, Respiratory w Gram Stain     Status: None (Preliminary result)   Collection Time: 01/01/24  3:09 PM   Specimen: Expectorated Sputum; Respiratory  Result Value Ref Range Status   Specimen Description   Final    EXPECTORATED SPUTUM Performed at [facility], [address], [place], [place] [address]    Special Requests   Final    NONE Performed at [facility], [address], [place], [place] [address]    Gram Stain   Final    NO WBC SEEN RARE GRAM NEGATIVE RODS Performed at [facility], [address], [place], [place] [address]    Culture PENDING  Incomplete   Report Status PENDING  Incomplete         Radiology Studies: ECHOCARDIOGRAM COMPLETE Result Date: 01/01/2024    ECHOCARDIOGRAM REPORT   Patient Name:   William Weber Date of Exam: 01/01/2024 Medical Rec #:  [id number]            Height:       69.0 in Accession #:    [id number]           Weight:       179.5 lb Date of Birth:  1930/06/05            BSA:          1.973 m Patient Age:    89 years             BP:           131/81 mmHg Patient Gender: M                    HR:           94 bpm.  Exam Location:  Inpatient Procedure: 2D Echo, Color Doppler and Cardiac Doppler (Both Spectral and Color            Flow Doppler were utilized during procedure). Indications:    Bacteremia R78.81  History:        Patient has prior history of Echocardiogram examinations, most                 recent 02/19/2019. CAD, Arrythmias:Atrial Fibrillation; Risk                 Factors:Dyslipidemia and Hypertension.  Sonographer:    Hersey Lorenzo RDCS Referring Phys: [id number] JENNIFER YATES IMPRESSIONS  1. Left ventricular ejection fraction, by estimation, is 40 to 45%. The left ventricle has mildly decreased function. The left ventricle demonstrates regional wall motion abnormalities (see scoring diagram/findings for description). Left  ventricular diastolic parameters are indeterminate.  2. Right ventricular systolic function is normal. The right ventricular size is normal. There is mildly elevated pulmonary artery systolic pressure.  3. Left atrial size was mildly dilated.  4. Right atrial size was moderately dilated.  5. The mitral valve is normal in structure. Mild mitral valve regurgitation. No evidence of mitral stenosis.  6. The aortic valve is tricuspid. There is mild calcification of the aortic valve. Aortic valve regurgitation is mild. Aortic valve sclerosis is present, with no evidence of aortic valve stenosis.  7. The inferior vena cava is normal in size with greater than 50% respiratory variability, suggesting right atrial pressure of 3 mmHg. Conclusion(s)/Recommendation(s): No evidence of valvular vegetations on this transthoracic echocardiogram. Consider a transesophageal echocardiogram to exclude infective endocarditis if clinically indicated. FINDINGS  Left Ventricle: Left ventricular ejection fraction, by estimation, is 40 to 45%. The left ventricle has mildly decreased function. The left ventricle demonstrates regional wall motion abnormalities. The left ventricular internal cavity size was normal in size. There is no left ventricular hypertrophy. Left ventricular diastolic parameters are indeterminate.  LV Wall Scoring: The basal inferior segment is akinetic. Right Ventricle: The right ventricular size is normal. No increase in right ventricular wall thickness. Right ventricular systolic function is normal. There is mildly elevated pulmonary artery systolic pressure. The tricuspid regurgitant velocity is 2.68  m/s, and with an assumed right atrial pressure of 8 mmHg, the estimated right ventricular systolic pressure is 36.7 mmHg. Left Atrium: Left atrial size was mildly dilated. Right Atrium: Right atrial size was moderately dilated. Pericardium: There is no evidence of pericardial effusion. Mitral Valve: The mitral valve is  normal in structure. Mild mitral annular calcification. Mild mitral valve regurgitation. No evidence of mitral valve stenosis. Tricuspid Valve: The tricuspid valve is normal in structure. Tricuspid valve regurgitation is mild . No evidence of tricuspid stenosis. Aortic Valve: The aortic valve is tricuspid. There is mild calcification of the aortic valve. Aortic valve regurgitation is mild. Aortic valve sclerosis is present, with no evidence of aortic valve stenosis. Pulmonic Valve: The pulmonic valve was normal in structure. Pulmonic valve regurgitation is not visualized. No evidence of pulmonic stenosis. Aorta: The aortic root is normal in size and structure. Venous: The inferior vena cava is normal in size with greater than 50% respiratory variability, suggesting right atrial pressure of 3 mmHg. IAS/Shunts: No atrial level shunt detected by color flow Doppler.  LEFT VENTRICLE PLAX 2D LVIDd:         4.80 cm LVIDs:         4.00 cm LV PW:         0.90 cm  LV IVS:        0.80 cm LVOT diam:     2.30 cm LV SV:         43 LV SV Index:   22 LVOT Area:     4.15 cm  IVC IVC diam: 2.20 cm LEFT ATRIUM              Index        RIGHT ATRIUM           Index LA diam:        4.40 cm  2.23 cm/m   RA Area:     20.30 cm LA Vol (A2C):   103.0 ml 52.21 ml/m  RA Volume:   66.50 ml  33.71 ml/m LA Vol (A4C):   92.3 ml  46.78 ml/m LA Biplane Vol: 102.0 ml 51.70 ml/m  AORTIC VALVE LVOT Vmax:   79.40 cm/s LVOT Vmean:  51.100 cm/s LVOT VTI:    0.104 m  AORTA Ao Root diam: 3.40 cm Ao Asc diam:  3.30 cm TRICUSPID VALVE TR Peak grad:   28.7 mmHg TR Vmax:        268.00 cm/s  SHUNTS Systemic VTI:  0.10 m Systemic Diam: 2.30 cm Dorothye Gathers MD Electronically signed by Dorothye Gathers MD Signature Date/Time: 01/01/2024/11:34:25 AM    Final     Scheduled Meds:  aspirin EC  81 mg Oral Daily   azithromycin  500 mg Oral Daily   clopidogrel  75 mg Oral Daily   enoxaparin (LOVENOX) injection  40 mg Subcutaneous Q24H   ezetimibe  10 mg Oral QHS    feeding supplement  237 mL Oral BID BM   insulin aspart  0-15 Units Subcutaneous TID WC   insulin aspart  0-5 Units Subcutaneous QHS   isosorbide mononitrate  15 mg Oral Daily   pantoprazole  20 mg Oral Daily   Continuous Infusions:  cefTRIAXone (ROCEPHIN)  IV 1 g (01/02/24 0535)     LOS: 3 days    Time spent: 39 min  Barbee Lew, MD 01/02/2024, 12:11 PM

## 2024-01-02 NOTE — TOC Transition Note (Signed)
 Transition of Care [facility]) - Discharge Note   Patient Details  Name: William Weber MRN: [id number] Date of Birth: Feb 27, 1930  [facility] CM/SW Contact:  Marty Sleet, LCSW Phone Number: 01/02/2024, 9:37 AM   Clinical Narrative:    HHPT has been arranged with [facility]. Rollator delivered to pt's room by [employer]. Pt's family to provide transportation at discharge. TOC signing off.    Final next level of care: Home w Home Health Services Barriers to Discharge: Barriers Resolved   Patient Goals and CMS Choice Patient states their goals for this hospitalization and ongoing recovery are:: To feel better CMS Medicare.gov Compare Post Acute Care list provided to:: Patient Choice offered to / list presented to : Patient [place] ownership interest in [facility].provided to::  (NA)    Discharge Placement                       Discharge Plan and Services Additional resources added to the After Visit Summary for   In-house Referral: Clinical Social Work Discharge Planning Services: NA Post Acute Care Choice: Durable Medical Equipment, Home Health          DME Arranged: Walker rolling with seat DME Agency: [employer] Date DME Agency Contacted: 01/01/24 Time DME Agency Contacted: 1123 Representative spoke with at DME Agency: April HH Arranged: PT [facility] Agency: [facility] Date [facility] Agency Contacted: 01/02/24 Time HH Agency Contacted: [phone] Representative spoke with at [facility] Agency: Cindie  Social Drivers of Health (SDOH) Interventions SDOH Screenings   Food Insecurity: No Food Insecurity (12/30/2023)  Housing: Low Risk  (12/30/2023)  Transportation Needs: No Transportation Needs (12/30/2023)  Utilities: Not At Risk (12/30/2023)  Social Connections: Socially Isolated (12/30/2023)  Tobacco Use: Low Risk  (12/30/2023)     Readmission Risk Interventions    12/31/2023    2:42 PM  Readmission Risk Prevention Plan  Transportation  Screening Complete  PCP or Specialist Appt within 5-7 Days Complete  Home Care Screening Complete  Medication Review (RN CM) Complete

## 2024-01-03 DIAGNOSIS — R443 Hallucinations, unspecified: Secondary | ICD-10-CM | POA: Diagnosis not present

## 2024-01-03 DIAGNOSIS — N189 Chronic kidney disease, unspecified: Secondary | ICD-10-CM | POA: Diagnosis not present

## 2024-01-03 DIAGNOSIS — R7401 Elevation of levels of liver transaminase levels: Secondary | ICD-10-CM

## 2024-01-03 DIAGNOSIS — J189 Pneumonia, unspecified organism: Secondary | ICD-10-CM | POA: Diagnosis not present

## 2024-01-03 DIAGNOSIS — A419 Sepsis, unspecified organism: Secondary | ICD-10-CM | POA: Diagnosis not present

## 2024-01-03 LAB — COMPREHENSIVE METABOLIC PANEL WITH GFR
ALT: 72 U/L — ABNORMAL HIGH (ref 0–44)
AST: 60 U/L — ABNORMAL HIGH (ref 15–41)
Albumin: 2.6 g/dL — ABNORMAL LOW (ref 3.5–5.0)
Alkaline Phosphatase: 45 U/L (ref 38–126)
Anion gap: 9 (ref 5–15)
BUN: 31 mg/dL — ABNORMAL HIGH (ref 8–23)
CO2: 22 mmol/L (ref 22–32)
Calcium: 8.6 mg/dL — ABNORMAL LOW (ref 8.9–10.3)
Chloride: 101 mmol/L (ref 98–111)
Creatinine, Ser: 1.28 mg/dL — ABNORMAL HIGH (ref 0.61–1.24)
GFR, Estimated: 53 mL/min — ABNORMAL LOW (ref >60)
Glucose, Bld: 150 mg/dL — ABNORMAL HIGH (ref 70–99)
Potassium: 3.4 mmol/L — ABNORMAL LOW (ref 3.5–5.1)
Sodium: 132 mmol/L — ABNORMAL LOW (ref 135–145)
Total Bilirubin: 0.7 mg/dL (ref 0.0–1.2)
Total Protein: 6.6 g/dL (ref 6.5–8.1)

## 2024-01-03 LAB — GLUCOSE, CAPILLARY
Glucose-Capillary: 141 mg/dL — ABNORMAL HIGH (ref 70–99)
Glucose-Capillary: 169 mg/dL — ABNORMAL HIGH (ref 70–99)

## 2024-01-03 LAB — VITAMIN B12: Vitamin B-12: 3344 pg/mL — ABNORMAL HIGH (ref 180–914)

## 2024-01-03 LAB — CBC
HCT: 35.2 % — ABNORMAL LOW (ref 39.0–52.0)
Hemoglobin: 11.6 g/dL — ABNORMAL LOW (ref 13.0–17.0)
MCH: 30.7 pg (ref 26.0–34.0)
MCHC: 33 g/dL (ref 30.0–36.0)
MCV: 93.1 fL (ref 80.0–100.0)
Platelets: 183 10*3/uL (ref 150–400)
RBC: 3.78 MIL/uL — ABNORMAL LOW (ref 4.22–5.81)
RDW: 12.8 % (ref 11.5–15.5)
WBC: 7.6 10*3/uL (ref 4.0–10.5)
nRBC: 0 % (ref 0.0–0.2)

## 2024-01-03 LAB — AMMONIA: Ammonia: 19 umol/L (ref 9–35)

## 2024-01-03 LAB — CULTURE, RESPIRATORY W GRAM STAIN: Gram Stain: NONE SEEN

## 2024-01-03 LAB — MISC LABCORP TEST (SEND OUT): Labcorp test code: 83935

## 2024-01-03 LAB — FOLATE: Folate: 14.4 ng/mL (ref >5.9)

## 2024-01-03 LAB — RPR: RPR Ser Ql: NONREACTIVE

## 2024-01-03 MED ORDER — POTASSIUM CHLORIDE CRYS ER 20 MEQ PO TBCR
40.0000 meq | EXTENDED_RELEASE_TABLET | Freq: Once | ORAL | Status: AC
  Administered 2024-01-03: 40 meq via ORAL
  Filled 2024-01-03: qty 2

## 2024-01-03 MED ORDER — AMOXICILLIN-POT CLAVULANATE 875-125 MG PO TABS: 1.0000 | ORAL_TABLET | Freq: Two times a day (BID) | ORAL | 0 refills | Status: DC

## 2024-01-03 NOTE — Progress Notes (Signed)
 Heart Failure Navigator Progress Note  Assessed for Heart & Vascular TOC clinic readiness.  Patient with a pending [facility] appointment. One will be made per Jayne Mews.    Navigator will sign off at this time.   Randie Bustle, BSN, Scientist, clinical (histocompatibility and immunogenetics) Only

## 2024-01-03 NOTE — Plan of Care (Signed)

## 2024-01-03 NOTE — Progress Notes (Signed)
 Heart Failure Navigator Progress Note  Assessed for Heart & Vascular TOC clinic readiness.  Patient does not meet criteria due to has as a scheduled [facility] appointment on 01/22/2024. .   Navigator will sign off at this time.    Randie Bustle, BSN, Scientist, clinical (histocompatibility and immunogenetics) Only

## 2024-01-03 NOTE — Discharge Summary (Signed)
 Physician Discharge Summary  William Weber ZOX:[id number] DOB: 06/19/1930 DOA: 12/30/2023  PCP: [facility]  Admit date: 12/30/2023 Discharge date: 01/03/2024  Admitted From: Home Disposition: Home Recommendations for Outpatient Follow-up:  Follow up with PCP in 1-2 weeks Please obtain BMP/CBC in one week  Home Health: Yes Equipment/Devices none   Discharge Condition: Stable CODE STATUS: Full code Diet recommendation: Cardiac   brief/Interim Summary:   89yo with h/o afib, COPD, DM, CAD, HTN, and HLD who presented on 4/13 with cough and weakness. He was found to have sepsis due to PNA and BCID shows staph bacteremia. He was started on Ceftriaxone and Azithromycin.  Discharge Diagnoses:  Principal Problem:   Sepsis due to pneumonia (HCC)    Sepsis due to community-acquired pneumonia with Staph bacteremia -he met criteria for sepsis on admission with leukocytosis tachycardia and community-acquired pneumonia.  There was no evidence of endorgan dysfunction.  He was started on Rocephin and azithromycin.  Blood culture came back positive for Staphylococcus capitis.  He was seen by infectious disease.  Repeat blood cultures do not have no growth after 4 days.  He was discharged on Augmentin to complete a 7-day course. Echocardiogram shows no vegetations   Hyponatremia remains stable sodium 132 on dc Likely hypovolemic hyponatremia given his recent reduced oral intake, and vomiting Also likely exacerbated by his hydrochlorothiazide, could be SIADH as well given his pulmonary infection Currently asymptomatic   Chronic systolic CHF-stable Echo 4/15 with EF 40-45% with regional WMA, no vegetations Negative delta on troponins during hospitalization  Last cath in 2023 with recommendation for indefinite DAPT in the setting of degenerated vein graft disease and multiple vein graft stents Recommend outpatient cardiology f/u   CKD stage IIIa Renal function  appears to be at baseline Hypertension Continue Imdur   Type 2 diabetes -continue glipizide   CAD Continue ASA and Plavix   Atrial fibrillation Rate controlled without medications Not on [facility]   GERD Continue PPI   Hypokalemia repleted potassium 3.4 on discharge    Generalized weakness/hallucinations/nausea/lack of appetite-UA negative, TSH 0.88, CT head moderate cerebral atrophy.  This was discussed with patient's daughter.   Estimated body mass index is 26.5 kg/m as calculated from the following:   Height as of this encounter: 5\' 9"  (1.753 m).   Weight as of this encounter: 81.4 kg.  Discharge Instructions  Discharge Instructions     Diet - low sodium heart healthy   Complete by: As directed    Increase activity slowly   Complete by: As directed       Allergies as of 01/03/2024       Reactions   Metformin And Related Diarrhea, Other (See Comments)   flatulence   Allopurinol Other (See Comments)   Painful skin nodules   Percocet [oxycodone-acetaminophen] Other (See Comments)   Dizziness and hallucinations    Statins Other (See Comments)   Hmg-Coa Reductase inhibitors, Myalgias        Medication List     TAKE these medications    acetaminophen 500 MG tablet Commonly known as: TYLENOL Take 500 mg by mouth every 6 (six) hours as needed for mild pain (pain score 1-3) or moderate pain (pain score 4-6).   amoxicillin-clavulanate 875-125 MG tablet Commonly known as: AUGMENTIN Take 1 tablet by mouth 2 (two) times daily. Start taking on: January 04, 2024   aspirin EC 81 MG tablet Take 81 mg by mouth daily.   clopidogrel 75 MG tablet Commonly known as:  PLAVIX TAKE 1 TABLET BY MOUTH EVERY DAY WITH BREAKFAST What changed: See the new instructions.   colchicine 0.6 MG tablet Take 0.6-1.2 mg by mouth daily. Take two tablets 1.2 at the first sign of gout flare, take one tablet 0.6 mg one hour later.   cyanocobalamin 1000 MCG tablet Commonly known as: VITAMIN  B12 Take 1,000 mcg by mouth 2 (two) times daily.   ezetimibe 10 MG tablet Commonly known as: ZETIA TAKE 1 TABLET(10 MG) BY MOUTH DAILY What changed: See the new instructions.   Fish Oil 1200 MG Caps Take 1,200-2,400 mg by mouth See admin instructions. Take 2400 mg in the morning and 1200 mg in the evening   glipiZIDE 2.5 MG 24 hr tablet Commonly known as: GLUCOTROL XL Take 2.5 mg by mouth daily.   isosorbide mononitrate 30 MG 24 hr tablet Commonly known as: IMDUR Take 0.5 tablets (15 mg total) by mouth daily.   lansoprazole 15 MG capsule Commonly known as: PREVACID Take 15 mg by mouth daily as needed (heartburn).   nitroGLYCERIN 0.4 MG SL tablet Commonly known as: NITROSTAT Place 1 tablet (0.4 mg total) under the tongue every 5 (five) minutes x 3 doses as needed for chest pain.   SYSTANE OP Place 1 drop into both eyes daily as needed (dry eyes).   triamterene-hydrochlorothiazide 37.5-25 MG capsule Commonly known as: DYAZIDE Take 1 capsule by mouth daily.   vitamin C with rose hips 1000 MG tablet Take 1,000 mg by mouth daily.               Durable Medical Equipment  (From admission, onward)           Start     Ordered   12/31/23 0929  For home use only DME 4 wheeled rolling walker with seat  Once       Question:  Patient needs a walker to treat with the following condition  Answer:  Difficulty in walking, not elsewhere classified   12/31/23 0928            Follow-up Information     [facility] Follow up.   Specialty: Home Health Services Why: Frances Furbish will follow up with you at discharge to provide home health physical therapy Contact information: [address] [place] [address] [phone]         [facility] Follow up.   Specialty: Family Medicine Contact information: [address] [place] [address] [phone]                Allergies  Allergen  Reactions   Metformin And Related Diarrhea and Other (See Comments)    flatulence   Allopurinol Other (See Comments)    Painful skin nodules   Percocet [Oxycodone-Acetaminophen] Other (See Comments)    Dizziness and hallucinations    Statins Other (See Comments)    Hmg-Coa Reductase inhibitors, Myalgias    Consultations: ID   Procedures/Studies: DG Abd 1 View Result Date: 01/02/2024 CLINICAL DATA:  Abdominal pain EXAM: ABDOMEN - 1 VIEW COMPARISON:  None Available. FINDINGS: There is a non obstructive bowel gas pattern. No supine evidence of free air. No organomegaly or suspicious calcification. No acute bony abnormality. Radiation seeds in the region of the prostate. IMPRESSION: No acute findings. Electronically Signed   By: Charlett Nose [name]   On: 01/02/2024 17:15   CT HEAD WO CONTRAST ( ) Result Date: 01/02/2024 CLINICAL DATA:  89 year old male with altered mental status,  weakness, sepsis. EXAM: CT HEAD WITHOUT CONTRAST TECHNIQUE: Contiguous axial images were obtained from the base of the skull through the vertex without intravenous contrast. RADIATION DOSE REDUCTION: This exam was performed according to the departmental dose-optimization program which includes automated exposure control, adjustment of the mA and/or kV according to patient size and/or use of iterative reconstruction technique. COMPARISON:  Head CT 09/09/2008. FINDINGS: Brain: Cerebral volume remains normal for age. No midline shift, ventriculomegaly, mass effect, evidence of mass lesion, intracranial hemorrhage or evidence of cortically based acute infarction. Mild to moderate patchy bilateral cerebral white matter hypodensity. Deep gray nuclei and posterior fossa appear relatively spared. No cortical encephalomalacia identified. Vascular: Calcified atherosclerosis at the skull base. No suspicious intracranial vascular hyperdensity. Skull: Intact.  No acute osseous abnormality identified. Sinuses/Orbits: Visualized paranasal  sinuses and mastoids are clear. Other: Postoperative changes to the globes. Some calcified scalp vessel atherosclerosis. IMPRESSION: 1. No acute intracranial abnormality. 2. Mild to moderate for age cerebral white matter disease. Electronically Signed   By: Marlise Simpers [name]   On: 01/02/2024 13:45   ECHOCARDIOGRAM COMPLETE Result Date: 01/01/2024    ECHOCARDIOGRAM REPORT   Patient Name:   William Weber Date of Exam: 01/01/2024 Medical Rec #:  [id number]            Height:       69.0 in Accession #:    [id number]           Weight:       179.5 lb Date of Birth:  07/19/1930            BSA:          1.973 m Patient Age:    89 years             BP:           131/81 mmHg Patient Gender: M                    HR:           94 bpm. Exam Location:  Inpatient Procedure: 2D Echo, Color Doppler and Cardiac Doppler (Both Spectral and Color            Flow Doppler were utilized during procedure). Indications:    Bacteremia R78.81  History:        Patient has prior history of Echocardiogram examinations, most                 recent 02/19/2019. CAD, Arrythmias:Atrial Fibrillation; Risk                 Factors:Dyslipidemia and Hypertension.  Sonographer:    Hersey Lorenzo RDCS Referring Phys: [id number] JENNIFER YATES IMPRESSIONS  1. Left ventricular ejection fraction, by estimation, is 40 to 45%. The left ventricle has mildly decreased function. The left ventricle demonstrates regional wall motion abnormalities (see scoring diagram/findings for description). Left ventricular diastolic parameters are indeterminate.  2. Right ventricular systolic function is normal. The right ventricular size is normal. There is mildly elevated pulmonary artery systolic pressure.  3. Left atrial size was mildly dilated.  4. Right atrial size was moderately dilated.  5. The mitral valve is normal in structure. Mild mitral valve regurgitation. No evidence of mitral stenosis.  6. The aortic valve is tricuspid. There is mild calcification of the aortic valve.  Aortic valve regurgitation is mild. Aortic valve sclerosis is present, with no evidence of aortic valve stenosis.  7. The inferior vena cava is normal in  size with greater than 50% respiratory variability, suggesting right atrial pressure of 3 mmHg. Conclusion(s)/Recommendation(s): No evidence of valvular vegetations on this transthoracic echocardiogram. Consider a transesophageal echocardiogram to exclude infective endocarditis if clinically indicated. FINDINGS  Left Ventricle: Left ventricular ejection fraction, by estimation, is 40 to 45%. The left ventricle has mildly decreased function. The left ventricle demonstrates regional wall motion abnormalities. The left ventricular internal cavity size was normal in size. There is no left ventricular hypertrophy. Left ventricular diastolic parameters are indeterminate.  LV Wall Scoring: The basal inferior segment is akinetic. Right Ventricle: The right ventricular size is normal. No increase in right ventricular wall thickness. Right ventricular systolic function is normal. There is mildly elevated pulmonary artery systolic pressure. The tricuspid regurgitant velocity is 2.68  m/s, and with an assumed right atrial pressure of 8 mmHg, the estimated right ventricular systolic pressure is 36.7 mmHg. Left Atrium: Left atrial size was mildly dilated. Right Atrium: Right atrial size was moderately dilated. Pericardium: There is no evidence of pericardial effusion. Mitral Valve: The mitral valve is normal in structure. Mild mitral annular calcification. Mild mitral valve regurgitation. No evidence of mitral valve stenosis. Tricuspid Valve: The tricuspid valve is normal in structure. Tricuspid valve regurgitation is mild . No evidence of tricuspid stenosis. Aortic Valve: The aortic valve is tricuspid. There is mild calcification of the aortic valve. Aortic valve regurgitation is mild. Aortic valve sclerosis is present, with no evidence of aortic valve stenosis. Pulmonic Valve:  The pulmonic valve was normal in structure. Pulmonic valve regurgitation is not visualized. No evidence of pulmonic stenosis. Aorta: The aortic root is normal in size and structure. Venous: The inferior vena cava is normal in size with greater than 50% respiratory variability, suggesting right atrial pressure of 3 mmHg. IAS/Shunts: No atrial level shunt detected by color flow Doppler.  LEFT VENTRICLE PLAX 2D LVIDd:         4.80 cm LVIDs:         4.00 cm LV PW:         0.90 cm LV IVS:        0.80 cm LVOT diam:     2.30 cm LV SV:         43 LV SV Index:   22 LVOT Area:     4.15 cm  IVC IVC diam: 2.20 cm LEFT ATRIUM              Index        RIGHT ATRIUM           Index LA diam:        4.40 cm  2.23 cm/m   RA Area:     20.30 cm LA Vol (A2C):   103.0 ml 52.21 ml/m  RA Volume:   66.50 ml  33.71 ml/m LA Vol (A4C):   92.3 ml  46.78 ml/m LA Biplane Vol: 102.0 ml 51.70 ml/m  AORTIC VALVE LVOT Vmax:   79.40 cm/s LVOT Vmean:  51.100 cm/s LVOT VTI:    0.104 m  AORTA Ao Root diam: 3.40 cm Ao Asc diam:  3.30 cm TRICUSPID VALVE TR Peak grad:   28.7 mmHg TR Vmax:        268.00 cm/s  SHUNTS Systemic VTI:  0.10 m Systemic Diam: 2.30 cm Dorothye Gathers MD Electronically signed by Dorothye Gathers MD Signature Date/Time: 01/01/2024/11:34:25 AM    Final    CT ABDOMEN PELVIS W CONTRAST Result Date: 12/30/2023 CLINICAL DATA:  Weakness with cough and nausea  for 1 week. EXAM: CT ABDOMEN AND PELVIS WITH CONTRAST TECHNIQUE: Multidetector CT imaging of the abdomen and pelvis was performed using the standard protocol following bolus administration of intravenous contrast. RADIATION DOSE REDUCTION: This exam was performed according to the departmental dose-optimization program which includes automated exposure control, adjustment of the mA and/or kV according to patient size and/or use of iterative reconstruction technique. CONTRAST:  80mL OMNIPAQUE IOHEXOL 300 MG/ML  SOLN COMPARISON:  08/30/2006 FINDINGS: Lower chest: Focal consolidative  opacity in the left lower lobe is consistent with pneumonia. Hepatobiliary: No suspicious focal abnormality within the liver parenchyma. Nodular liver contour is consistent with cirrhosis. There is no evidence for gallstones, gallbladder wall thickening, or pericholecystic fluid. No intrahepatic or extrahepatic biliary dilation. Pancreas: No focal mass lesion. No dilatation of the main duct. No intraparenchymal cyst. No peripancreatic edema. Spleen: No splenomegaly. No suspicious focal mass lesion. Adrenals/Urinary Tract: No adrenal nodule or mass. Right kidney unremarkable. Tiny well-defined homogeneous low-density lesion in the left kidney is too small to characterize but is statistically most likely benign and probably a cyst. No followup imaging is recommended. No evidence for hydroureter. The urinary bladder appears normal for the degree of distention. Stomach/Bowel: Stomach is unremarkable. No gastric wall thickening. No evidence of outlet obstruction. Duodenum is normally positioned as is the ligament of Treitz. No small bowel wall thickening. No small bowel dilatation. The terminal ileum is normal. The appendix is normal. No gross colonic mass. No colonic wall thickening. Diverticular changes are noted in the left colon without evidence of diverticulitis. Vascular/Lymphatic: There is moderate atherosclerotic calcification of the abdominal aorta without aneurysm. There is no gastrohepatic or hepatoduodenal ligament lymphadenopathy. No retroperitoneal or mesenteric lymphadenopathy. No pelvic sidewall lymphadenopathy. Reproductive: Brachytherapy seeds are seen in the prostate parenchyma. Other: No intraperitoneal free fluid. Musculoskeletal: Small bilateral groin hernias contain only fat. Scattered tiny sclerotic foci in the right femoral head, lumbar spine and bony pelvis are stable since the 2007 exam consistent with benign etiology such as bone islands. IMPRESSION: 1. Focal consolidative opacity in the left  lower lobe is consistent with pneumonia. 2. Nodular liver contour consistent with cirrhosis. No suspicious focal liver lesion. 3. Left colonic diverticulosis without diverticulitis. 4. Small bilateral groin hernias contain only fat. 5.  Aortic Atherosclerois (ICD10-170.0) Electronically Signed   By: Kennith Center [name]   On: 12/30/2023 06:25   DG Chest Portable 1 View Result Date: 12/30/2023 CLINICAL DATA:  Cough and fever EXAM: PORTABLE CHEST 1 VIEW COMPARISON:  02/18/2019 FINDINGS: Normal heart size and mediastinal contours. Prior CABG. No acute infiltrate or edema. No effusion or pneumothorax. No acute osseous findings. Extensive artifact from EKG leads. IMPRESSION: No evidence of acute disease. Electronically Signed   By: Tiburcio Pea [name]   On: 12/30/2023 05:16   (Echo, Carotid, EGD, Colonoscopy, ERCP)    Subjective:  Anxious to go home feels better does not want to be sitting in 1 room NO SOB of breath cough vomiting diarrhea Discharge Exam: Vitals:   01/02/24 2023 01/03/24 0529  BP: (!) 142/73 125/69  Pulse: (!) 104 61  Resp: 18   Temp: (!) 97.2 F (36.2 C) 98.1 F (36.7 C)  SpO2: 99% 97%   Vitals:   01/02/24 0255 01/02/24 1216 01/02/24 2023 01/03/24 0529  BP: 133/79 117/73 (!) 142/73 125/69  Pulse: 97 93 (!) 104 61  Resp: 18 17 18    Temp: 97.6 F (36.4 C) 98.5 F (36.9 C) (!) 97.2 F (36.2 C) 98.1 F (36.7 C)  TempSrc:    Oral  SpO2: 96% 97% 99% 97%  Weight:      Height:        General: Pt is alert, awake, not in acute distress Cardiovascular: RRR, S1/S2 +, no rubs, no gallops Respiratory: CTA bilaterally, no wheezing, no rhonchi Abdominal: Soft, NT, ND, bowel sounds + Extremities: no edema, no cyanosis    The results of significant diagnostics from this hospitalization (including imaging, microbiology, ancillary and laboratory) are listed below for reference.     Microbiology: Recent Results (from the past 240 hours)  Culture, blood (routine x 2)      Status: Abnormal   Collection Time: 12/30/23  4:30 AM   Specimen: BLOOD  Result Value Ref Range Status   Specimen Description   Final    BLOOD LEFT ANTECUBITAL Performed at [facility], [address] [place], [place] [address]    Special Requests   Final    BOTTLES DRAWN AEROBIC AND ANAEROBIC Blood Culture results may not be optimal due to an inadequate volume of blood received in culture bottles Performed at [facility], [address] [place], [place] [address]    Culture  Setup Time   Final    GRAM POSITIVE RODS ANAEROBIC BOTTLE ONLY CRITICAL RESULT CALLED TO, READ BACK BY AND VERIFIED WITH: PHARMD C.WINDSOR AT 1011 ON 12/31/2023 BY T.SAAD.    Culture (A)  Final    CORYNEBACTERIUM MINUTISSIMUM Standardized susceptibility testing for this organism is not available. Performed at [facility], [address], [place], [place] [address]    Report Status 01/02/2024 FINAL  Final  Resp panel by RT-PCR (RSV, Flu A&B, Covid)     Status: None   Collection Time: 12/30/23  4:32 AM   Specimen: Nasal Swab  Result Value Ref Range Status   SARS Coronavirus 2 by RT PCR NEGATIVE NEGATIVE Final    Comment: (NOTE) SARS-CoV-2 target nucleic acids are NOT DETECTED.  The SARS-CoV-2 RNA is generally detectable in upper respiratory specimens during the acute phase of infection. The lowest concentration of SARS-CoV-2 viral copies this assay can detect is 138 copies/mL. A negative result does not preclude SARS-Cov-2 infection and should not be used as the sole basis for treatment or other patient management decisions. A negative result may occur with  improper specimen collection/handling, submission of specimen other than nasopharyngeal swab, presence of viral mutation(s) within the areas targeted by this assay, and inadequate number of viral copies(<138 copies/mL). A negative result must be combined with clinical observations, patient history, and  epidemiological information. The expected result is Negative.  Fact Sheet for Patients:  BloggerCourse.com  Fact Sheet for Healthcare Providers:  SeriousBroker.it  This test is no t yet approved or cleared by the [place]  FDA and  has been authorized for detection and/or diagnosis of SARS-CoV-2 by FDA under an Emergency Use Authorization (EUA). This EUA will remain  in effect (meaning this test can be used) for the duration of the COVID-19 declaration under Section 564(b)(1) of the Act, 21 U.S.C.section 360bbb-3(b)(1), unless the authorization is terminated  or revoked sooner.       Influenza A by PCR NEGATIVE NEGATIVE Final   Influenza B by PCR NEGATIVE NEGATIVE Final    Comment: (NOTE) The Xpert Xpress SARS-CoV-2/FLU/RSV plus assay is intended as an aid in the diagnosis of influenza from Nasopharyngeal swab specimens and should not be used as a sole basis for treatment. Nasal washings and aspirates are unacceptable for Xpert  Xpress SARS-CoV-2/FLU/RSV testing.  Fact Sheet for Patients: BloggerCourse.com  Fact Sheet for Healthcare Providers: SeriousBroker.it  This test is not yet approved or cleared by the [place]  FDA and has been authorized for detection and/or diagnosis of SARS-CoV-2 by FDA under an Emergency Use Authorization (EUA). This EUA will remain in effect (meaning this test can be used) for the duration of the COVID-19 declaration under Section 564(b)(1) of the Act, 21 U.S.C. section 360bbb-3(b)(1), unless the authorization is terminated or revoked.     Resp Syncytial Virus by PCR NEGATIVE NEGATIVE Final    Comment: (NOTE) Fact Sheet for Patients: BloggerCourse.com  Fact Sheet for Healthcare Providers: SeriousBroker.it  This test is not yet approved or cleared by the [place]  FDA and has been  authorized for detection and/or diagnosis of SARS-CoV-2 by FDA under an Emergency Use Authorization (EUA). This EUA will remain in effect (meaning this test can be used) for the duration of the COVID-19 declaration under Section 564(b)(1) of the Act, 21 U.S.C. section 360bbb-3(b)(1), unless the authorization is terminated or revoked.  Performed at [facility], [address], [place], [place] [address]   Culture, blood (routine x 2)     Status: Abnormal   Collection Time: 12/30/23  5:01 AM   Specimen: BLOOD  Result Value Ref Range Status   Specimen Description   Final    BLOOD RIGHT ANTECUBITAL Performed at [facility], [address], [place], [place] [address]    Special Requests   Final    BOTTLES DRAWN AEROBIC AND ANAEROBIC Blood Culture results may not be optimal due to an inadequate volume of blood received in culture bottles Performed at [facility], [address] [place], [place] [address]    Culture  Setup Time   Final    GRAM POSITIVE COCCI IN BOTH AEROBIC AND ANAEROBIC BOTTLES Organism ID to follow CRITICAL RESULT CALLED TO, READ BACK BY AND VERIFIED WITH: L POINDEXTER,PHARMD@[id number]  12/31/23 MK    Culture (A)  Final    STAPHYLOCOCCUS CAPITIS THE SIGNIFICANCE OF ISOLATING THIS ORGANISM FROM A SINGLE SET OF BLOOD CULTURES WHEN MULTIPLE SETS ARE DRAWN IS UNCERTAIN. PLEASE NOTIFY THE MICROBIOLOGY DEPARTMENT WITHIN ONE WEEK IF SPECIATION AND SENSITIVITIES ARE REQUIRED. Performed at [facility], [address] [place], [place] [address]    Report Status 01/02/2024 FINAL  Final  Blood Culture ID Panel (Reflexed)     Status: Abnormal   Collection Time: 12/30/23  5:01 AM  Result Value Ref Range Status   Enterococcus faecalis NOT DETECTED NOT DETECTED Final   Enterococcus Faecium NOT DETECTED NOT DETECTED Final   Listeria monocytogenes NOT DETECTED NOT DETECTED Final   Staphylococcus species DETECTED (A) NOT  DETECTED Final    Comment: CRITICAL RESULT CALLED TO, READ BACK BY AND VERIFIED WITH: L POINDEXTER,PHARMD@[id number]  12/31/23 MK    Staphylococcus aureus (BCID) NOT DETECTED NOT DETECTED Final   Staphylococcus epidermidis NOT DETECTED NOT DETECTED Final   Staphylococcus lugdunensis NOT DETECTED NOT DETECTED Final   Streptococcus species NOT DETECTED NOT DETECTED Final   Streptococcus agalactiae NOT DETECTED NOT DETECTED Final   Streptococcus pneumoniae NOT DETECTED NOT DETECTED Final   Streptococcus pyogenes NOT DETECTED NOT DETECTED Final   A.calcoaceticus-baumannii NOT DETECTED NOT DETECTED Final   Bacteroides fragilis NOT DETECTED NOT DETECTED Final   Enterobacterales NOT DETECTED NOT DETECTED Final   Enterobacter cloacae complex NOT DETECTED NOT DETECTED Final   Escherichia coli NOT DETECTED NOT DETECTED Final  Klebsiella aerogenes NOT DETECTED NOT DETECTED Final   Klebsiella oxytoca NOT DETECTED NOT DETECTED Final   Klebsiella pneumoniae NOT DETECTED NOT DETECTED Final   Proteus species NOT DETECTED NOT DETECTED Final   Salmonella species NOT DETECTED NOT DETECTED Final   Serratia marcescens NOT DETECTED NOT DETECTED Final   Haemophilus influenzae NOT DETECTED NOT DETECTED Final   Neisseria meningitidis NOT DETECTED NOT DETECTED Final   Pseudomonas aeruginosa NOT DETECTED NOT DETECTED Final   Stenotrophomonas maltophilia NOT DETECTED NOT DETECTED Final   Candida albicans NOT DETECTED NOT DETECTED Final   Candida auris NOT DETECTED NOT DETECTED Final   Candida glabrata NOT DETECTED NOT DETECTED Final   Candida krusei NOT DETECTED NOT DETECTED Final   Candida parapsilosis NOT DETECTED NOT DETECTED Final   Candida tropicalis NOT DETECTED NOT DETECTED Final   Cryptococcus neoformans/gattii NOT DETECTED NOT DETECTED Final    Comment: Performed at [facility], [address], [place], [place] [address]  Urine Culture     Status: Abnormal   Collection Time: 12/30/23  8:19 AM    Specimen: Urine, Clean Catch  Result Value Ref Range Status   Specimen Description   Final    URINE, CLEAN CATCH Performed at [facility], [address], [place], [place] [address]    Special Requests   Final    NONE Performed at [facility], [address], [place], [place] [address]    Culture (A)  Final    <10,000 COLONIES/mL INSIGNIFICANT GROWTH Performed at [facility], [address] [place], [place] [address]    Report Status 12/31/2023 FINAL  Final  Culture, blood (Routine X 2) w Reflex to ID Panel     Status: None (Preliminary result)   Collection Time: 12/31/23  2:50 PM   Specimen: BLOOD RIGHT HAND  Result Value Ref Range Status   Specimen Description   Final    BLOOD RIGHT HAND Performed at [facility], [address], [place], [place] [address]    Special Requests   Final    BOTTLES DRAWN AEROBIC AND ANAEROBIC Blood Culture adequate volume Performed at [facility], [address] [place], [place] [address]    Culture   Final    NO GROWTH 3 DAYS Performed at [facility], [address] [place], [place] [address]    Report Status PENDING  Incomplete  Culture, blood (Routine X 2) w Reflex to ID Panel     Status: None (Preliminary result)   Collection Time: 12/31/23  2:52 PM   Specimen: BLOOD LEFT HAND  Result Value Ref Range Status   Specimen Description   Final    BLOOD LEFT HAND Performed at [facility], [address] [place], [place] [address]    Special Requests   Final    BOTTLES DRAWN AEROBIC AND ANAEROBIC Blood Culture adequate volume Performed at [facility], [address], [place], [place] [address]    Culture   Final    NO GROWTH 3 DAYS Performed at [facility], [address] [place], [place] [address]    Report Status PENDING  Incomplete  Culture, Respiratory w Gram Stain     Status: None   Collection Time: 01/01/24  3:09 PM    Specimen: Expectorated Sputum; Respiratory  Result Value Ref Range Status   Specimen Description   Final    EXPECTORATED SPUTUM Performed at [facility], [address], [place], [place] [address]    Special Requests   Final    NONE Performed at [facility], [address] [place], [place] [address]    Gram Stain NO WBC SEEN RARE GRAM NEGATIVE RODS   Final   Culture   Final    Normal respiratory flora-no Staph aureus or Pseudomonas seen Performed at [facility], [address] [place], [place] [address]    Report Status 01/03/2024 FINAL  Final  Culture, blood (Routine X 2) w Reflex to ID Panel     Status: None (Preliminary result)   Collection Time: 01/02/24 11:20 AM   Specimen: BLOOD  Result Value Ref Range Status   Specimen Description BLOOD RIGHT ANTECUBITAL  Final   Special Requests   Final    BOTTLES DRAWN AEROBIC AND ANAEROBIC Blood Culture results may not be optimal due to an inadequate volume of blood received in culture bottles   Culture   Final    NO GROWTH < 24 HOURS Performed at [facility], [address] [place], [place] [address]    Report Status PENDING  Incomplete  Culture, blood (Routine X 2) w Reflex to ID Panel     Status: None (Preliminary result)   Collection Time: 01/02/24 11:20 AM   Specimen: BLOOD RIGHT HAND  Result Value Ref Range Status   Specimen Description   Final    BLOOD RIGHT HAND Performed at [facility], [address] [place], [place] [address]    Special Requests   Final    Blood Culture adequate volume Performed at [facility], [address] [place], [place] [address]    Culture   Final    NO GROWTH < 24 HOURS Performed at [facility], [address] [place], [place] [address]    Report Status PENDING  Incomplete     Labs: BNP (last 3 results) No results for input(s): "BNP" in the last 8760 hours. Basic Metabolic Panel: Recent  Labs  Lab 12/30/23 1942 12/31/23 0524 01/01/24 0513 01/02/24 0518 01/02/24 1120 01/03/24 0601  NA 128* 129* 129* 129*  --  132*  K 3.6 3.6 3.4* 3.1*  --  3.4*  CL 98 98 98 98  --  101  CO2 18* 18* 19* 21*  --  22  GLUCOSE 157* 95 104* 150*  --  150*  BUN 43* 45* 48* 40*  --  31*  CREATININE 1.73* 1.25* 1.62* 1.38*  --  1.28*  CALCIUM 7.9* 8.1* 8.5* 8.5*  --  8.6*  MG  --   --   --   --  1.7  --    Liver Function Tests: Recent Labs  Lab 12/30/23 0429 01/03/24 0601  AST 52* 60*  ALT 47* 72*  ALKPHOS 39 45  BILITOT 1.8* 0.7  PROT 8.2* 6.6  ALBUMIN 3.6 2.6*   Recent Labs  Lab 12/30/23 0429  LIPASE 31   Recent Labs  Lab 01/03/24 0601  AMMONIA 19   CBC: Recent Labs  Lab 12/30/23 0429 12/31/23 0524 01/01/24 0513 01/02/24 0518 01/03/24 0601  WBC 13.0* 9.2 8.7 7.0 7.6  NEUTROABS 11.4*  --  7.4 5.5  --   HGB 13.5 11.4* 12.1* 10.8* 11.6*  HCT 39.3 33.9* 36.6* 31.0* 35.2*  MCV 90.8 91.9 93.6 89.6 93.1  PLT 144* 123* 126* 132* 183   Cardiac Enzymes: No results for input(s): "CKTOTAL", "CKMB", "CKMBINDEX", "TROPONINI"  in the last 168 hours. BNP: Invalid input(s): "POCBNP" CBG: Recent Labs  Lab 01/02/24 1217 01/02/24 1727 01/02/24 2026 01/03/24 0741 01/03/24 1156  GLUCAP 194* 260* 168* 141* 169*   D-Dimer No results for input(s): "DDIMER" in the last 72 hours. Hgb A1c No results for input(s): "HGBA1C" in the last 72 hours. Lipid Profile No results for input(s): "CHOL", "HDL", "LDLCALC", "TRIG", "CHOLHDL", "LDLDIRECT" in the last 72 hours. Thyroid function studies Recent Labs    01/02/24 1120  TSH 0.887   Anemia work up Recent Labs    01/03/24 0601  VITAMINB12 3,344*  FOLATE 14.4   Urinalysis    Component Value Date/Time   COLORURINE YELLOW 12/30/2023 0819   APPEARANCEUR CLEAR 12/30/2023 0819   LABSPEC >1.046 (H) 12/30/2023 0819   PHURINE 5.0 12/30/2023 0819   GLUCOSEU NEGATIVE 12/30/2023 0819   HGBUR NEGATIVE 12/30/2023 0819    BILIRUBINUR NEGATIVE 12/30/2023 0819   KETONESUR NEGATIVE 12/30/2023 0819   PROTEINUR 100 (A) 12/30/2023 0819   NITRITE NEGATIVE 12/30/2023 0819   LEUKOCYTESUR NEGATIVE 12/30/2023 0819   Sepsis Labs Recent Labs  Lab 12/31/23 0524 01/01/24 0513 01/02/24 0518 01/03/24 0601  WBC 9.2 8.7 7.0 7.6   Microbiology Recent Results (from the past 240 hours)  Culture, blood (routine x 2)     Status: Abnormal   Collection Time: 12/30/23  4:30 AM   Specimen: BLOOD  Result Value Ref Range Status   Specimen Description   Final    BLOOD LEFT ANTECUBITAL Performed at [facility], [address], [place], [place] [address]    Special Requests   Final    BOTTLES DRAWN AEROBIC AND ANAEROBIC Blood Culture results may not be optimal due to an inadequate volume of blood received in culture bottles Performed at [facility], [address], [place], [place] [address]    Culture  Setup Time   Final    GRAM POSITIVE RODS ANAEROBIC BOTTLE ONLY CRITICAL RESULT CALLED TO, READ BACK BY AND VERIFIED WITH: PHARMD C.WINDSOR AT 1011 ON 12/31/2023 BY T.SAAD.    Culture (A)  Final    CORYNEBACTERIUM MINUTISSIMUM Standardized susceptibility testing for this organism is not available. Performed at [facility], [address], [place], [place] [address]    Report Status 01/02/2024 FINAL  Final  Resp panel by RT-PCR (RSV, Flu A&B, Covid)     Status: None   Collection Time: 12/30/23  4:32 AM   Specimen: Nasal Swab  Result Value Ref Range Status   SARS Coronavirus 2 by RT PCR NEGATIVE NEGATIVE Final    Comment: (NOTE) SARS-CoV-2 target nucleic acids are NOT DETECTED.  The SARS-CoV-2 RNA is generally detectable in upper respiratory specimens during the acute phase of infection. The lowest concentration of SARS-CoV-2 viral copies this assay can detect is 138 copies/mL. A negative result does not preclude SARS-Cov-2 infection and should not be used as the sole  basis for treatment or other patient management decisions. A negative result may occur with  improper specimen collection/handling, submission of specimen other than nasopharyngeal swab, presence of viral mutation(s) within the areas targeted by this assay, and inadequate number of viral copies(<138 copies/mL). A negative result must be combined with clinical observations, patient history, and epidemiological information. The expected result is Negative.  Fact Sheet for Patients:  BloggerCourse.com  Fact Sheet for Healthcare Providers:  SeriousBroker.it  This test is no t yet approved or cleared by the [place]  FDA and  has been authorized for  detection and/or diagnosis of SARS-CoV-2 by FDA under an Emergency Use Authorization (EUA). This EUA will remain  in effect (meaning this test can be used) for the duration of the COVID-19 declaration under Section 564(b)(1) of the Act, 21 U.S.C.section 360bbb-3(b)(1), unless the authorization is terminated  or revoked sooner.       Influenza A by PCR NEGATIVE NEGATIVE Final   Influenza B by PCR NEGATIVE NEGATIVE Final    Comment: (NOTE) The Xpert Xpress SARS-CoV-2/FLU/RSV plus assay is intended as an aid in the diagnosis of influenza from Nasopharyngeal swab specimens and should not be used as a sole basis for treatment. Nasal washings and aspirates are unacceptable for Xpert Xpress SARS-CoV-2/FLU/RSV testing.  Fact Sheet for Patients: BloggerCourse.com  Fact Sheet for Healthcare Providers: SeriousBroker.it  This test is not yet approved or cleared by the [place] FDA and has been authorized for detection and/or diagnosis of SARS-CoV-2 by FDA under an Emergency Use Authorization (EUA). This EUA will remain in effect (meaning this test can be used) for the duration of the COVID-19 declaration under Section 564(b)(1) of the Act,  21 U.S.C. section 360bbb-3(b)(1), unless the authorization is terminated or revoked.     Resp Syncytial Virus by PCR NEGATIVE NEGATIVE Final    Comment: (NOTE) Fact Sheet for Patients: BloggerCourse.com  Fact Sheet for Healthcare Providers: SeriousBroker.it  This test is not yet approved or cleared by the [place] FDA and has been authorized for detection and/or diagnosis of SARS-CoV-2 by FDA under an Emergency Use Authorization (EUA). This EUA will remain in effect (meaning this test can be used) for the duration of the COVID-19 declaration under Section 564(b)(1) of the Act, 21 U.S.C. section 360bbb-3(b)(1), unless the authorization is terminated or revoked.  Performed at [facility], [address] [place], [place] [address]   Culture, blood (routine x 2)     Status: Abnormal   Collection Time: 12/30/23  5:01 AM   Specimen: BLOOD  Result Value Ref Range Status   Specimen Description   Final    BLOOD RIGHT ANTECUBITAL Performed at [facility], [address] [place], [place] [address]    Special Requests   Final    BOTTLES DRAWN AEROBIC AND ANAEROBIC Blood Culture results may not be optimal due to an inadequate volume of blood received in culture bottles Performed at [facility], [address], [place], [place] [address]    Culture  Setup Time   Final    GRAM POSITIVE COCCI IN BOTH AEROBIC AND ANAEROBIC BOTTLES Organism ID to follow CRITICAL RESULT CALLED TO, READ BACK BY AND VERIFIED WITH: L POINDEXTER,[email or web address]@[email or web address]  12/31/23 MK    Culture (A)  Final    STAPHYLOCOCCUS CAPITIS THE SIGNIFICANCE OF ISOLATING THIS ORGANISM FROM A SINGLE SET OF BLOOD CULTURES WHEN MULTIPLE SETS ARE DRAWN IS UNCERTAIN. PLEASE NOTIFY THE MICROBIOLOGY DEPARTMENT WITHIN ONE WEEK IF SPECIATION AND SENSITIVITIES ARE REQUIRED. Performed at [facility], [address]  [place], [place] [address]    Report Status 01/02/2024 FINAL  Final  Blood Culture ID Panel (Reflexed)     Status: Abnormal   Collection Time: 12/30/23  5:01 AM  Result Value Ref Range Status   Enterococcus faecalis NOT DETECTED NOT DETECTED Final   Enterococcus Faecium NOT DETECTED NOT DETECTED Final   Listeria monocytogenes NOT DETECTED NOT DETECTED Final   Staphylococcus species DETECTED (A) NOT DETECTED Final    Comment: CRITICAL RESULT CALLED TO, READ BACK BY AND  VERIFIED WITH: L POINDEXTER,[email or web address]@[email or web address]  12/31/23 MK    Staphylococcus aureus (BCID) NOT DETECTED NOT DETECTED Final   Staphylococcus epidermidis NOT DETECTED NOT DETECTED Final   Staphylococcus lugdunensis NOT DETECTED NOT DETECTED Final   Streptococcus species NOT DETECTED NOT DETECTED Final   Streptococcus agalactiae NOT DETECTED NOT DETECTED Final   Streptococcus pneumoniae NOT DETECTED NOT DETECTED Final   Streptococcus pyogenes NOT DETECTED NOT DETECTED Final   A.calcoaceticus-baumannii NOT DETECTED NOT DETECTED Final   Bacteroides fragilis NOT DETECTED NOT DETECTED Final   Enterobacterales NOT DETECTED NOT DETECTED Final   Enterobacter cloacae complex NOT DETECTED NOT DETECTED Final   Escherichia coli NOT DETECTED NOT DETECTED Final   Klebsiella aerogenes NOT DETECTED NOT DETECTED Final   Klebsiella oxytoca NOT DETECTED NOT DETECTED Final   Klebsiella pneumoniae NOT DETECTED NOT DETECTED Final   Proteus species NOT DETECTED NOT DETECTED Final   Salmonella species NOT DETECTED NOT DETECTED Final   Serratia marcescens NOT DETECTED NOT DETECTED Final   Haemophilus influenzae NOT DETECTED NOT DETECTED Final   Neisseria meningitidis NOT DETECTED NOT DETECTED Final   Pseudomonas aeruginosa NOT DETECTED NOT DETECTED Final   Stenotrophomonas maltophilia NOT DETECTED NOT DETECTED Final   Candida albicans NOT DETECTED NOT DETECTED Final   Candida auris NOT DETECTED NOT DETECTED Final   Candida glabrata NOT DETECTED NOT  DETECTED Final   Candida krusei NOT DETECTED NOT DETECTED Final   Candida parapsilosis NOT DETECTED NOT DETECTED Final   Candida tropicalis NOT DETECTED NOT DETECTED Final   Cryptococcus neoformans/gattii NOT DETECTED NOT DETECTED Final    Comment: Performed at [facility], [address] [place], [place] [address]  Urine Culture     Status: Abnormal   Collection Time: 12/30/23  8:19 AM   Specimen: Urine, Clean Catch  Result Value Ref Range Status   Specimen Description   Final    URINE, CLEAN CATCH Performed at [facility], [address], [place], [place] [address]    Special Requests   Final    NONE Performed at [facility], [address], [place], [place] [address]    Culture (A)  Final    <10,000 COLONIES/mL INSIGNIFICANT GROWTH Performed at [facility], [address] [place], [place] [address]    Report Status 12/31/2023 FINAL  Final  Culture, blood (Routine X 2) w Reflex to ID Panel     Status: None (Preliminary result)   Collection Time: 12/31/23  2:50 PM   Specimen: BLOOD RIGHT HAND  Result Value Ref Range Status   Specimen Description   Final    BLOOD RIGHT HAND Performed at [facility], [address] [place], [place] [address]    Special Requests   Final    BOTTLES DRAWN AEROBIC AND ANAEROBIC Blood Culture adequate volume Performed at [facility], [address], [place], [place] [address]    Culture   Final    NO GROWTH 3 DAYS Performed at [facility], [address], [place], [place] [address]    Report Status PENDING  Incomplete  Culture, blood (Routine X 2) w Reflex to ID Panel     Status: None (Preliminary result)   Collection Time: 12/31/23  2:52 PM   Specimen: BLOOD LEFT HAND  Result Value Ref Range Status   Specimen Description   Final    BLOOD LEFT HAND Performed at [facility], [address], [place], [place] [address]  Special Requests   Final     BOTTLES DRAWN AEROBIC AND ANAEROBIC Blood Culture adequate volume Performed at [facility], [address], [place], [place] [address]    Culture   Final    NO GROWTH 3 DAYS Performed at [facility], [address] [place], [place] [address]    Report Status PENDING  Incomplete  Culture, Respiratory w Gram Stain     Status: None   Collection Time: 01/01/24  3:09 PM   Specimen: Expectorated Sputum; Respiratory  Result Value Ref Range Status   Specimen Description   Final    EXPECTORATED SPUTUM Performed at [facility], [address], [place], [place] [address]    Special Requests   Final    NONE Performed at [facility], [address] [place], [place] [address]    Gram Stain NO WBC SEEN RARE GRAM NEGATIVE RODS   Final   Culture   Final    Normal respiratory flora-no Staph aureus or Pseudomonas seen Performed at [facility], [address] [place], [place] [address]    Report Status 01/03/2024 FINAL  Final  Culture, blood (Routine X 2) w Reflex to ID Panel     Status: None (Preliminary result)   Collection Time: 01/02/24 11:20 AM   Specimen: BLOOD  Result Value Ref Range Status   Specimen Description BLOOD RIGHT ANTECUBITAL  Final   Special Requests   Final    BOTTLES DRAWN AEROBIC AND ANAEROBIC Blood Culture results may not be optimal due to an inadequate volume of blood received in culture bottles   Culture   Final    NO GROWTH < 24 HOURS Performed at [facility], [address], [place], [place] [address]    Report Status PENDING  Incomplete  Culture, blood (Routine X 2) w Reflex to ID Panel     Status: None (Preliminary result)   Collection Time: 01/02/24 11:20 AM   Specimen: BLOOD RIGHT HAND  Result Value Ref Range Status   Specimen Description   Final    BLOOD RIGHT HAND Performed at [facility], [address] [place], [place] [address]    Special Requests   Final     Blood Culture adequate volume Performed at [facility], [address], [place], [place] [address]    Culture   Final    NO GROWTH < 24 HOURS Performed at [facility], [address] [place], [place] [address]    Report Status PENDING  Incomplete     Time coordinating discharge: 39 minutes  SIGNED: Barbee Lew, MD  [facility] 01/03/2024, 4:31 PM

## 2024-01-03 NOTE — Progress Notes (Addendum)
 [facility] Infectious Diseases Follow Up Note  Patient Identification: Patient Name: William Weber MRN: [id number] Admit Date: 12/30/2023  4:10 AM Age: 89 y.o.Today's Date: 01/03/2024  Reason for Visit: CAP, bacteremia  Principal Problem:   Sepsis due to pneumonia [facility])  Antibiotics:  Azithromycin 4/13- Ceftriaxone 4/12-c   Lines/Hardware:  Interval Events: afebrile Lab with Na 132, k 3.4, r 1.28, AST 60, ALT 72. CT head with no acute abnormality   Assessment 12 Y O male with PMH of A fib, CAD s/p CABG,  Carotid artery disease, HTN, HLD, prostate ca, GERD/Hiatal hernia, COPD who presented to ED 4/13 with generalized weakness, cough, nausea for a week including few episodes of vomiting on day of arrival. Associated symptoms include LLQ abdominal pain with question of streaks of blood in vomitus. On EMS arrival febrile and tachycardic. Admitted with   # CAP - back to room air  - Strep pneumo and legionella urine ag negative - 4/14 resp cx normal resp flora - clinically improved   # Staph hominis in 1 set and corynebacterium minutissimum - Repeat blood cx 4/14 NG in 3 days   # CKD - Cr around baseline   # Hallucinations - seems to have resolved. CT head with no acute abnormality   # Transaminitis - no GI complaints. monitor   Recommendations Will transition to PO augmentin to complete 7 days course. EOT 01/04/24 DC azithromycin, has completed 4 days  ID available as needed, recall back with questions or concerns   Rest of the management as per the primary team. Thank you for the consult. Please page with pertinent questions or concerns.  ______________________________________________________________________ Subjective patient seen and examined at the bedside. He is eager to go home. Breathing is better. No complaints  Vitals BP 125/69 (BP Location: Left Arm)   Pulse 61   Temp 98.1 F (36.7 C) (Oral)   Resp  18   Ht 5\' 9"  (1.753 m)   Wt 81.4 kg   SpO2 97%   BMI 26.50 kg/m      Physical Exam Constitutional:  elderly male lying in the bed, non toxic appearing    Comments: HEENT wnl  Cardiovascular:     Rate and Rhythm: Normal rate and regular rhythm.     Heart sounds:   Pulmonary:     Effort: Pulmonary effort is normal.     Comments:   Abdominal:     Palpations: Abdomen non distended     Tenderness:   Musculoskeletal:        General: No swelling or tenderness in peripheral joints   Skin:    Comments: no rashes   Neurological:     General: awake, alert and oriented, grossly non focal   Psychiatric:        Mood and Affect: Mood normal.   Pertinent Microbiology Results for orders placed or performed during the hospital encounter of 12/30/23  Culture, blood (routine x 2)     Status: Abnormal   Collection Time: 12/30/23  4:30 AM   Specimen: BLOOD  Result Value Ref Range Status   Specimen Description   Final    BLOOD LEFT ANTECUBITAL Performed at [facility], [address] [place], [place] [address]    Special Requests   Final    BOTTLES DRAWN AEROBIC AND ANAEROBIC Blood Culture results may not be optimal due to an inadequate volume of blood received in culture bottles Performed at [facility], [address] [place], [place]  [address]    Culture  Setup Time   Final    GRAM POSITIVE RODS ANAEROBIC BOTTLE ONLY CRITICAL RESULT CALLED TO, READ BACK BY AND VERIFIED WITH: PHARMD C.WINDSOR AT 1011 ON 12/31/2023 BY T.SAAD.    Culture (A)  Final    CORYNEBACTERIUM MINUTISSIMUM Standardized susceptibility testing for this organism is not available. Performed at [facility], [address], [place], [place] [address]    Report Status 01/02/2024 FINAL  Final  Resp panel by RT-PCR (RSV, Flu A&B, Covid)     Status: None   Collection Time: 12/30/23  4:32 AM   Specimen: Nasal Swab  Result Value Ref Range Status   SARS Coronavirus  2 by RT PCR NEGATIVE NEGATIVE Final    Comment: (NOTE) SARS-CoV-2 target nucleic acids are NOT DETECTED.  The SARS-CoV-2 RNA is generally detectable in upper respiratory specimens during the acute phase of infection. The lowest concentration of SARS-CoV-2 viral copies this assay can detect is 138 copies/mL. A negative result does not preclude SARS-Cov-2 infection and should not be used as the sole basis for treatment or other patient management decisions. A negative result may occur with  improper specimen collection/handling, submission of specimen other than nasopharyngeal swab, presence of viral mutation(s) within the areas targeted by this assay, and inadequate number of viral copies(<138 copies/mL). A negative result must be combined with clinical observations, patient history, and epidemiological information. The expected result is Negative.  Fact Sheet for Patients:  BloggerCourse.com  Fact Sheet for Healthcare Providers:  SeriousBroker.it  This test is no t yet approved or cleared by the [place]  FDA and  has been authorized for detection and/or diagnosis of SARS-CoV-2 by FDA under an Emergency Use Authorization (EUA). This EUA will remain  in effect (meaning this test can be used) for the duration of the COVID-19 declaration under Section 564(b)(1) of the Act, 21 U.S.C.section 360bbb-3(b)(1), unless the authorization is terminated  or revoked sooner.       Influenza A by PCR NEGATIVE NEGATIVE Final   Influenza B by PCR NEGATIVE NEGATIVE Final    Comment: (NOTE) The Xpert Xpress SARS-CoV-2/FLU/RSV plus assay is intended as an aid in the diagnosis of influenza from Nasopharyngeal swab specimens and should not be used as a sole basis for treatment. Nasal washings and aspirates are unacceptable for Xpert Xpress SARS-CoV-2/FLU/RSV testing.  Fact Sheet for Patients: BloggerCourse.com  Fact  Sheet for Healthcare Providers: SeriousBroker.it  This test is not yet approved or cleared by the [place]  FDA and has been authorized for detection and/or diagnosis of SARS-CoV-2 by FDA under an Emergency Use Authorization (EUA). This EUA will remain in effect (meaning this test can be used) for the duration of the COVID-19 declaration under Section 564(b)(1) of the Act, 21 U.S.C. section 360bbb-3(b)(1), unless the authorization is terminated or revoked.     Resp Syncytial Virus by PCR NEGATIVE NEGATIVE Final    Comment: (NOTE) Fact Sheet for Patients: BloggerCourse.com  Fact Sheet for Healthcare Providers: SeriousBroker.it  This test is not yet approved or cleared by the [place]  FDA and has been authorized for detection and/or diagnosis of SARS-CoV-2 by FDA under an Emergency Use Authorization (EUA). This EUA will remain in effect (meaning this test can be used) for the duration of the COVID-19 declaration under Section 564(b)(1) of the Act, 21 U.S.C. section 360bbb-3(b)(1), unless the authorization is terminated or revoked.  Performed at [facility], [address] [place], [place] [address]   Culture,  blood (routine x 2)     Status: Abnormal   Collection Time: 12/30/23  5:01 AM   Specimen: BLOOD  Result Value Ref Range Status   Specimen Description   Final    BLOOD RIGHT ANTECUBITAL Performed at [facility], [address] [place], [place] [address]    Special Requests   Final    BOTTLES DRAWN AEROBIC AND ANAEROBIC Blood Culture results may not be optimal due to an inadequate volume of blood received in culture bottles Performed at [facility], [address] [place], [place] [address]    Culture  Setup Time   Final    GRAM POSITIVE COCCI IN BOTH AEROBIC AND ANAEROBIC BOTTLES Organism ID to follow CRITICAL RESULT CALLED  TO, READ BACK BY AND VERIFIED WITH: L POINDEXTER,PHARMD@[email or web address]  12/31/23 MK    Culture (A)  Final    STAPHYLOCOCCUS CAPITIS THE SIGNIFICANCE OF ISOLATING THIS ORGANISM FROM A SINGLE SET OF BLOOD CULTURES WHEN MULTIPLE SETS ARE DRAWN IS UNCERTAIN. PLEASE NOTIFY THE MICROBIOLOGY DEPARTMENT WITHIN ONE WEEK IF SPECIATION AND SENSITIVITIES ARE REQUIRED. Performed at [facility], [address], [place], [place] [address]    Report Status 01/02/2024 FINAL  Final  Blood Culture ID Panel (Reflexed)     Status: Abnormal   Collection Time: 12/30/23  5:01 AM  Result Value Ref Range Status   Enterococcus faecalis NOT DETECTED NOT DETECTED Final   Enterococcus Faecium NOT DETECTED NOT DETECTED Final   Listeria monocytogenes NOT DETECTED NOT DETECTED Final   Staphylococcus species DETECTED (A) NOT DETECTED Final    Comment: CRITICAL RESULT CALLED TO, READ BACK BY AND VERIFIED WITH: L POINDEXTER,PHARMD@[email or web address]  12/31/23 MK    Staphylococcus aureus (BCID) NOT DETECTED NOT DETECTED Final   Staphylococcus epidermidis NOT DETECTED NOT DETECTED Final   Staphylococcus lugdunensis NOT DETECTED NOT DETECTED Final   Streptococcus species NOT DETECTED NOT DETECTED Final   Streptococcus agalactiae NOT DETECTED NOT DETECTED Final   Streptococcus pneumoniae NOT DETECTED NOT DETECTED Final   Streptococcus pyogenes NOT DETECTED NOT DETECTED Final   A.calcoaceticus-baumannii NOT DETECTED NOT DETECTED Final   Bacteroides fragilis NOT DETECTED NOT DETECTED Final   Enterobacterales NOT DETECTED NOT DETECTED Final   Enterobacter cloacae complex NOT DETECTED NOT DETECTED Final   Escherichia coli NOT DETECTED NOT DETECTED Final   Klebsiella aerogenes NOT DETECTED NOT DETECTED Final   Klebsiella oxytoca NOT DETECTED NOT DETECTED Final   Klebsiella pneumoniae NOT DETECTED NOT DETECTED Final   Proteus species NOT DETECTED NOT DETECTED Final   Salmonella species NOT DETECTED NOT DETECTED Final   Serratia marcescens NOT  DETECTED NOT DETECTED Final   Haemophilus influenzae NOT DETECTED NOT DETECTED Final   Neisseria meningitidis NOT DETECTED NOT DETECTED Final   Pseudomonas aeruginosa NOT DETECTED NOT DETECTED Final   Stenotrophomonas maltophilia NOT DETECTED NOT DETECTED Final   Candida albicans NOT DETECTED NOT DETECTED Final   Candida auris NOT DETECTED NOT DETECTED Final   Candida glabrata NOT DETECTED NOT DETECTED Final   Candida krusei NOT DETECTED NOT DETECTED Final   Candida parapsilosis NOT DETECTED NOT DETECTED Final   Candida tropicalis NOT DETECTED NOT DETECTED Final   Cryptococcus neoformans/gattii NOT DETECTED NOT DETECTED Final    Comment: Performed at [facility], [address] [place], [place] [address]  Urine Culture     Status: Abnormal   Collection Time: 12/30/23  8:19 AM   Specimen: Urine, Clean Catch  Result Value Ref Range Status  Specimen Description   Final    URINE, CLEAN CATCH Performed at [facility], [address] [place], [place] [address]    Special Requests   Final    NONE Performed at [facility], [address] [place], [place] [address]    Culture (A)  Final    <10,000 COLONIES/mL INSIGNIFICANT GROWTH Performed at [facility], [address], [place], [place] [address]    Report Status 12/31/2023 FINAL  Final  Culture, blood (Routine X 2) w Reflex to ID Panel     Status: None (Preliminary result)   Collection Time: 12/31/23  2:50 PM   Specimen: BLOOD RIGHT HAND  Result Value Ref Range Status   Specimen Description   Final    BLOOD RIGHT HAND Performed at [facility], [address] [place], [place] [address]    Special Requests   Final    BOTTLES DRAWN AEROBIC AND ANAEROBIC Blood Culture adequate volume Performed at [facility], [address] [place], [place] [address]    Culture   Final    NO GROWTH 3 DAYS Performed at [facility], [address]  [place], [place] [address]    Report Status PENDING  Incomplete  Culture, blood (Routine X 2) w Reflex to ID Panel     Status: None (Preliminary result)   Collection Time: 12/31/23  2:52 PM   Specimen: BLOOD LEFT HAND  Result Value Ref Range Status   Specimen Description   Final    BLOOD LEFT HAND Performed at [facility], [address] [place], [place] [address]    Special Requests   Final    BOTTLES DRAWN AEROBIC AND ANAEROBIC Blood Culture adequate volume Performed at [facility], [address] [place], [place] [address]    Culture   Final    NO GROWTH 3 DAYS Performed at [facility], [address], [place], [place] [address]    Report Status PENDING  Incomplete  Culture, Respiratory w Gram Stain     Status: None   Collection Time: 01/01/24  3:09 PM   Specimen: Expectorated Sputum; Respiratory  Result Value Ref Range Status   Specimen Description   Final    EXPECTORATED SPUTUM Performed at [facility], [address] [place], [place] [address]    Special Requests   Final    NONE Performed at [facility], [address] [place], [place] [address]    Gram Stain NO WBC SEEN RARE GRAM NEGATIVE RODS   Final   Culture   Final    Normal respiratory flora-no Staph aureus or Pseudomonas seen Performed at [facility], [address] [place], [place] [address]    Report Status 01/03/2024 FINAL  Final  Culture, blood (Routine X 2) w Reflex to ID Panel     Status: None (Preliminary result)   Collection Time: 01/02/24 11:20 AM   Specimen: BLOOD  Result Value Ref Range Status   Specimen Description BLOOD RIGHT ANTECUBITAL  Final   Special Requests   Final    BOTTLES DRAWN AEROBIC AND ANAEROBIC Blood Culture results may not be optimal due to an inadequate volume of blood received in culture bottles   Culture   Final    NO GROWTH < 24 HOURS Performed at [facility], [address]  [place], [place] [address]    Report Status PENDING  Incomplete  Culture, blood (Routine X 2) w Reflex to ID Panel     Status: None (Preliminary result)   Collection Time: 01/02/24 11:20 AM   Specimen: BLOOD RIGHT HAND  Result Value Ref Range Status   Specimen Description   Final    BLOOD RIGHT HAND Performed at [facility], [address], [place], [place] [address]    Special Requests   Final    Blood Culture adequate volume Performed at [facility], [address] [place], [place] [address]    Culture   Final    NO GROWTH < 24 HOURS Performed at [facility], [address] [place], [place] [address]    Report Status PENDING  Incomplete   Pertinent Lab.    Latest Ref Rng & Units 01/03/2024    6:01 AM 01/02/2024    5:18 AM 01/01/2024    5:13 AM  CBC  WBC 4.0 - 10.5 K/uL 7.6  7.0  8.7   Hemoglobin 13.0 - 17.0 g/dL 21.3  08.6  57.8   Hematocrit 39.0 - 52.0 % 35.2  31.0  36.6   Platelets 150 - 400 K/uL 183  132  126       Latest Ref Rng & Units 01/03/2024    6:01 AM 01/02/2024    5:18 AM 01/01/2024    5:13 AM  CMP  Glucose 70 - 99 mg/dL 469  629  528   BUN 8 - 23 mg/dL 31  40  48   Creatinine 0.61 - 1.24 mg/dL 4.13  2.44  0.10   Sodium 135 - 145 mmol/L 132  129  129   Potassium 3.5 - 5.1 mmol/L 3.4  3.1  3.4   Chloride 98 - 111 mmol/L 101  98  98   CO2 22 - 32 mmol/L 22  21  19    Calcium 8.9 - 10.3 mg/dL 8.6  8.5  8.5   Total Protein 6.5 - 8.1 g/dL 6.6     Total Bilirubin 0.0 - 1.2 mg/dL 0.7     Alkaline Phos 38 - 126 U/L 45     AST 15 - 41 U/L 60     ALT 0 - 44 U/L 72        Pertinent Imaging today Plain films and CT images have been personally visualized and interpreted; radiology reports have been reviewed. Decision making incorporated into the Impression /   DG Abd 1 View Result Date: 01/02/2024 CLINICAL DATA:  Abdominal pain EXAM: ABDOMEN - 1 VIEW COMPARISON:  None Available. FINDINGS: There is a non obstructive bowel gas pattern.  No supine evidence of free air. No organomegaly or suspicious calcification. No acute bony abnormality. Radiation seeds in the region of the prostate. IMPRESSION: No acute findings. Electronically Signed   By: Charlett Nose [name]   On: 01/02/2024 17:15   CT HEAD WO CONTRAST ( ) Result Date: 01/02/2024 CLINICAL DATA:  89 year old male with altered mental status, weakness, sepsis. EXAM: CT HEAD WITHOUT CONTRAST TECHNIQUE: Contiguous axial images were obtained from the base of the skull through the vertex without intravenous contrast. RADIATION DOSE REDUCTION: This exam was performed according to the departmental dose-optimization program which includes automated exposure control, adjustment of the mA and/or kV according to patient size and/or use of iterative reconstruction technique. COMPARISON:  Head CT 09/09/2008. FINDINGS: Brain: Cerebral volume remains normal for age. No midline shift, ventriculomegaly, mass effect, evidence of mass lesion, intracranial hemorrhage or evidence of cortically based  acute infarction. Mild to moderate patchy bilateral cerebral white matter hypodensity. Deep gray nuclei and posterior fossa appear relatively spared. No cortical encephalomalacia identified. Vascular: Calcified atherosclerosis at the skull base. No suspicious intracranial vascular hyperdensity. Skull: Intact.  No acute osseous abnormality identified. Sinuses/Orbits: Visualized paranasal sinuses and mastoids are clear. Other: Postoperative changes to the globes. Some calcified scalp vessel atherosclerosis. IMPRESSION: 1. No acute intracranial abnormality. 2. Mild to moderate for age cerebral white matter disease. Electronically Signed   By: Marlise Simpers [name]   On: 01/02/2024 13:45     I have personally spent 36 minutes involved in face-to-face and non-face-to-face activities for this patient on the day of the visit. Professional time spent includes the following activities: Preparing to see the patient (review of tests),  Obtaining and/or reviewing separately obtained history (admission/discharge record), Performing a medically appropriate examination and/or evaluation , Ordering medications/tests/procedures, referring and communicating with other health care professionals, Documenting clinical information in the EMR, Independently interpreting results (not separately reported), Communicating results to the patient/family/caregiver, Counseling and educating the patient/family/caregiver and Care coordination (not separately reported).   Plan d/w requesting provider as well as ID pharm D  Of note, portions of this note may have been created with voice recognition software. While this note has been edited for accuracy, occasional wrong-word or 'sound-a-like' substitutions may have occurred due to the inherent limitations of voice recognition software.   Electronically signed by:   Terre Ferri, MD Infectious Disease Physician [facility] Pager: [phone]

## 2024-01-05 LAB — CULTURE, BLOOD (ROUTINE X 2)
Culture: NO GROWTH
Culture: NO GROWTH
Special Requests: ADEQUATE
Special Requests: ADEQUATE

## 2024-01-07 LAB — CULTURE, BLOOD (ROUTINE X 2)
Culture: NO GROWTH
Culture: NO GROWTH
Special Requests: ADEQUATE

## 2024-01-21 NOTE — Progress Notes (Signed)
 " Cardiology Office Note    Patient Name: William Weber Date of Encounter: 01/22/2024  Primary Care Provider:  [facility] Primary Cardiologist:  Lonni Cash, MD Primary Electrophysiologist: None   Past Medical History    Past Medical History:  Diagnosis Date   Anxiety    mild   Atrial fibrillation (HCC)    CAD (coronary artery disease)    DES proximal circumflex January, 2009  /   nuclear, December, 2011, no ischemia, ejection fraction 70% 02/20/19 PCI/DES to SVG-rPDA 03/10/19 PCI/DES x1 to SVG-Diag, DESx2 to SVG to OM   Carotid artery disease (HCC)    Doppler, August, 2011, zero 39% RIC A., 60-79% LICA, stable   Chronic headaches    COPD (chronic obstructive pulmonary disease) (HCC)    Diabetic peripheral neuropathy (HCC) 01/22/2018   Dyslipidemia    statin intolerance   Ejection fraction    EF 70%, nuclear, December, 2011   GERD (gastroesophageal reflux disease)    Hiatal hernia    Hx of CABG    Hendrickson, 2002   Hypercholesterolemia    Hypertension    MGUS (monoclonal gammopathy of unknown significance) 01/22/2018   Prostate cancer (HCC)    TUR and implant   Rash    2009, etiology not clear, not from Plavix , Claritin helped   Statin intolerance     History of Present Illness  William Weber is a 89 y.o. male with a PMH of CAD s/p CABG x 4 in 2009 with NSTEMI 2020 and DES to vein graft PDA DES to diagonal vein graft and LHC 03/2022 DES to SVG to diagonal, HTN, HLD, COPD statin intolerant, carotid artery disease, PAF (patient refused AC) presents today for posthospital follow-up.  William Weber was previously followed by Dr. Micky and is currently followed by Dr. Cash for management of CAD.  He has an extensive history with CABG x 4 in 2009 (LIMA to LAD, SVG to D1, sequential SVG to PDA and PLA, SVG to OM1) and subsequent NSTEMI in 2020 with DES to vein graft of PDA and repeat LHC 2 months later with DES to diagonal vein  graft.  He underwent subsequent left heart cath in 03/2022 with DES to saphenous to diagonal.  He has a known history of PAF but refused anticoagulation in the past.  He was last seen by Dr. Cash on 08/15/2023 and denied chest pain or palpitations during visit.  He endorsed some heartburn after meals but denied exertional chest pain.  He was continued on current GDMT and BP was noted to be well-controlled.  He refused consideration of Praluent or Repatha due to statin intolerance.  He was admitted 12/30/2023 with complaint of weakness.  He reported generalized weakness with cough nausea and 2 episodes of vomiting.  He noted streaks of blood in his emesis and was transported via EMS with tachycardia and borderline fever.  He underwent workup and was diagnosed with sepsis and pneumonia.  He had blood cultures obtained that showed staph bacteremia and patient was started on ceftriaxone  and azithromycin .  He had further blood cultures that were positive for Staphylococcus capitis and patient was evaluated by infectious disease repeat blood cultures showing no growth after 4 days.  He was discharged on Augmentin  and 2D echo was completed that showed no evidence of vegetation with reduced EF of 40-45% with RWMA and mildly elevated PASP with mildly dilated LA/moderately dilated RA and mild MVR.  He was noted to have hyponatremia due to hypovolemia  and exacerbated by HCTZ.  He underwent potassium repletion for K of 3.4.  William Weber presents today alone for posthospital follow-up. He was hospitalized on December 30, 2023, for pneumonia and bacteremia, marking his first significant illness.  He was discharged on antibiotics and resumed his hydrochlorothiazide  and triamterene  for blood pressure management. No new medications have been added since discharge. Since his hospitalization, he has noticed coughing up phlegm with occasional red streaks, but no epistaxis. He has a history of a hiatal hernia affecting his  esophagus, causing his voice to become rough with prolonged talking. No significant coughing or gagging, but he has noticed phlegm discoloration in the past month or two. He experiences shortness of breath, particularly when lying down, but this is not new. He sleeps on his side due to discomfort when lying on his back. He has difficulty sleeping at night, often staying awake until early morning, and then sleeping late into the day. He attributes some of his sleep disturbances to frequent phone calls. No swelling in his ankles. He has a history of diabetes and arthritis, with a brace for knee stability due to lack of cartilage. He remains active, mowing his lawn and performing yard work, which he finds enjoyable and fulfilling. He has not experienced chest pain during these activities recently. He has a supportive family, with his son being his best friend and exercise partner. He values staying active and engaged in activities like maintaining his yard and car, which he finds rewarding. Patient denies chest pain, palpitations, dyspnea, PND, orthopnea, nausea, vomiting, dizziness, syncope, edema, weight gain, or early satiety.  Discussed the use of AI scribe software for clinical note transcription with the patient, who gave verbal consent to proceed.  History of Present Illness    Review of Systems  Please see the history of present illness.    All other systems reviewed and are otherwise negative except as noted above.  Physical Exam    Wt Readings from Last 3 Encounters:  01/22/24 172 lb 6.4 oz (78.2 kg)  12/30/23 179 lb 7.3 oz (81.4 kg)  08/15/23 178 lb (80.7 kg)   VS: Vitals:   01/22/24 1354  BP: 132/64  Pulse: 88  SpO2: 97%  ,Body mass index is 25.46 kg/m. GEN: Well nourished, well developed in no acute distress Neck: No JVD; No carotid bruits Pulmonary: Clear to auscultation without rales, wheezing or rhonchi  Cardiovascular: Normal rate. Regular rhythm. Normal S1. Normal S2.    Murmurs: There is no murmur.  ABDOMEN: Soft, non-tender, non-distended EXTREMITIES:  No edema; No deformity   EKG/LABS/ Recent Cardiac Studies   ECG personally reviewed by me today -none completed today  Risk Assessment/Calculations:    CHA2DS2-VASc Score = 5   This indicates a 7.2% annual risk of stroke. The patient's score is based upon: CHF History: 1 HTN History: 1 Diabetes History: 0 Stroke History: 0 Vascular Disease History: 1 Age Score: 2 Gender Score: 0         Lab Results  Component Value Date   WBC 7.6 01/03/2024   HGB 11.6 (L) 01/03/2024   HCT 35.2 (L) 01/03/2024   MCV 93.1 01/03/2024   PLT 183 01/03/2024   Lab Results  Component Value Date   CREATININE 1.28 (H) 01/03/2024   BUN 31 (H) 01/03/2024   NA 132 (L) 01/03/2024   K 3.4 (L) 01/03/2024   CL 101 01/03/2024   CO2 22 01/03/2024   Lab Results  Component Value Date   CHOL 163  02/19/2019   HDL 45 02/19/2019   LDLCALC 93 02/19/2019   TRIG 127 02/19/2019   CHOLHDL 3.6 02/19/2019    Lab Results  Component Value Date   HGBA1C 7.6 (H) 12/31/2023   Assessment & Plan    Assessment and Plan Assessment & Plan    1.  History of CAD: -s/p CABG x 4 in 2009 with NSTEMI 2020 and DES to vein graft PDA DES to diagonal vein graft and LHC 03/2022 DES to SVG to diagonal - Reports no chest pain or tightness since hospitalization. - Continue current GDMT with ezetimibe  10 mg, Plavix  75 mg, ASA 81 mg Imdur  30 mg, Nitrostat  0.4 mg as needed  2.  HFrEF: - 2D echo during hospitalization showing reduced EF of 40-45% with RWMA and mildly elevated PASP with mildly dilated LA/moderately dilated RA and mild MVR. - Patient euvolemic on examination today. -Heart rate controlled and blood pressure well-managed. Reports improvement in strength and activity tolerance. - Order echocardiogram to assess heart function in one month. - Order BNP today - 3.  History of persistent AF: - Patient rate controlled with heart  rate of 88 -not on anticoagulation due to personal preference and past negative experiences with anticoagulants in family members. No current symptoms reported. -  4.  Essential hypertension: Patient's blood pressure today was stable at 132/64 - Continue Dyazide  37.5-25 mg  5.  Hyperlipidemia: - Patient's LDL cholesterol was 104 - Continue ezetimibe  10 mg   6. Hyponatremia: Recent hyponatremia during hospitalization, likely related to illness and medication. Hydrochlorothiazide  was held during hospitalization and resumed post-discharge. Sodium levels to be monitored. - Check BMET and CBC today  Disposition: Follow-up with Lonni Cash, MD or APP in 6 months    Signed, Wyn Raddle, Jackee Shove, NP 01/22/2024, 6:09 PM [facility] "

## 2024-01-22 ENCOUNTER — Ambulatory Visit: Attending: Nurse Practitioner | Admitting: Nurse Practitioner

## 2024-01-22 ENCOUNTER — Encounter: Payer: Self-pay | Admitting: Nurse Practitioner

## 2024-01-22 VITALS — BP 132/64 | HR 88 | Ht 69.0 in | Wt 172.4 lb

## 2024-01-22 DIAGNOSIS — I4819 Other persistent atrial fibrillation: Secondary | ICD-10-CM | POA: Diagnosis not present

## 2024-01-22 DIAGNOSIS — I10 Essential (primary) hypertension: Secondary | ICD-10-CM | POA: Diagnosis not present

## 2024-01-22 DIAGNOSIS — E785 Hyperlipidemia, unspecified: Secondary | ICD-10-CM

## 2024-01-22 DIAGNOSIS — I25119 Atherosclerotic heart disease of native coronary artery with unspecified angina pectoris: Secondary | ICD-10-CM

## 2024-01-22 DIAGNOSIS — I5020 Unspecified systolic (congestive) heart failure: Secondary | ICD-10-CM | POA: Diagnosis not present

## 2024-01-22 NOTE — Patient Instructions (Signed)
 Medication Instructions:  Your physician recommends that you continue on your current medications as directed. Please refer to the Current Medication list given to you today. *If you need a refill on your cardiac medications before your next appointment, please call your pharmacy*  Lab Work: TODAY-BMET, BNP, CBC If you have labs (blood work) drawn today and your tests are completely normal, you will receive your results only by: MyChart Message (if you have MyChart) OR A paper copy in the mail If you have any lab test that is abnormal or we need to change your treatment, we will call you to review the results.  Testing/Procedures: Your physician has requested that you have an echocardiogram. Echocardiography is a painless test that uses sound waves to create images of your heart. It provides your doctor with information about the size and shape of your heart and how well your heart's chambers and valves are working. This procedure takes approximately one hour. There are no restrictions for this procedure. Please do NOT wear cologne, perfume, aftershave, or lotions (deodorant is allowed). Please arrive 15 minutes prior to your appointment time.  Please note: We ask at that you not bring children with you during ultrasound (echo/ vascular) testing. Due to room size and safety concerns, children are not allowed in the ultrasound rooms during exams. Our front office staff cannot provide observation of children in our lobby area while testing is being conducted. An adult accompanying a patient to their appointment will only be allowed in the ultrasound room at the discretion of the ultrasound technician under special circumstances. We apologize for any inconvenience. Schedule in 1 month   Follow-Up: At [facility], you and your health needs are our priority.  As part of our continuing mission to provide you with exceptional heart care, our providers are all part of one team.  This team  includes your primary Cardiologist (physician) and Advanced Practice Providers or APPs (Physician Assistants and Nurse Practitioners) who all work together to provide you with the care you need, when you need it.  Your next appointment:   4 month(s)  Provider:   Antoinette Batman, MD    We recommend signing up for the patient portal called "MyChart".  Sign up information is provided on this After Visit Summary.  MyChart is used to connect with patients for Virtual Visits (Telemedicine).  Patients are able to view lab/test results, encounter notes, upcoming appointments, etc.  Non-urgent messages can be sent to your provider as well.   To learn more about what you can do with MyChart, go to [email or web address].   Other Instructions

## 2024-01-23 LAB — PRO B NATRIURETIC PEPTIDE: NT-Pro BNP: 3786 pg/mL — ABNORMAL HIGH (ref 0–486)

## 2024-01-23 LAB — CBC
Hematocrit: 37.9 % (ref 37.5–51.0)
Hemoglobin: 12.6 g/dL — ABNORMAL LOW (ref 13.0–17.7)
MCH: 31 pg (ref 26.6–33.0)
MCHC: 33.2 g/dL (ref 31.5–35.7)
MCV: 93 fL (ref 79–97)
Platelets: 198 10*3/uL (ref 150–450)
RBC: 4.06 x10E6/uL — ABNORMAL LOW (ref 4.14–5.80)
RDW: 13.4 % (ref 11.6–15.4)
WBC: 7.4 10*3/uL (ref 3.4–10.8)

## 2024-01-23 LAB — BASIC METABOLIC PANEL WITH GFR
BUN/Creatinine Ratio: 25 — ABNORMAL HIGH (ref 10–24)
BUN: 26 mg/dL (ref 10–36)
CO2: 19 mmol/L — ABNORMAL LOW (ref 20–29)
Calcium: 9.3 mg/dL (ref 8.6–10.2)
Chloride: 102 mmol/L (ref 96–106)
Creatinine, Ser: 1.06 mg/dL (ref 0.76–1.27)
Glucose: 115 mg/dL — ABNORMAL HIGH (ref 70–99)
Potassium: 5.1 mmol/L (ref 3.5–5.2)
Sodium: 140 mmol/L (ref 134–144)
eGFR: 66 mL/min/{1.73_m2} (ref >59)

## 2024-01-29 ENCOUNTER — Ambulatory Visit: Payer: Self-pay

## 2024-02-27 ENCOUNTER — Ambulatory Visit (HOSPITAL_COMMUNITY)
Admission: RE | Admit: 2024-02-27 | Discharge: 2024-02-27 | Disposition: A | Discharge status: Home / Self Care | Source: Ambulatory Visit | Attending: Cardiology | Admitting: Cardiology

## 2024-02-27 DIAGNOSIS — I25119 Atherosclerotic heart disease of native coronary artery with unspecified angina pectoris: Secondary | ICD-10-CM

## 2024-02-27 DIAGNOSIS — I4819 Other persistent atrial fibrillation: Secondary | ICD-10-CM | POA: Insufficient documentation

## 2024-02-27 DIAGNOSIS — I5020 Unspecified systolic (congestive) heart failure: Secondary | ICD-10-CM | POA: Insufficient documentation

## 2024-02-27 DIAGNOSIS — E785 Hyperlipidemia, unspecified: Secondary | ICD-10-CM | POA: Diagnosis present

## 2024-02-27 DIAGNOSIS — I10 Essential (primary) hypertension: Secondary | ICD-10-CM | POA: Insufficient documentation

## 2024-02-27 LAB — ECHOCARDIOGRAM COMPLETE
Area-P 1/2: 4 cm2
P 1/2 time: 324 ms
S' Lateral: 4 cm

## 2024-05-21 ENCOUNTER — Ambulatory Visit: Attending: Cardiovascular Disease | Admitting: Cardiovascular Disease

## 2024-05-21 ENCOUNTER — Encounter: Payer: Self-pay | Admitting: Cardiovascular Disease

## 2024-05-21 VITALS — BP 134/84 | HR 58 | Ht 69.5 in | Wt 179.2 lb

## 2024-05-21 DIAGNOSIS — E785 Hyperlipidemia, unspecified: Secondary | ICD-10-CM | POA: Diagnosis not present

## 2024-05-21 DIAGNOSIS — I10 Essential (primary) hypertension: Secondary | ICD-10-CM | POA: Diagnosis not present

## 2024-05-21 DIAGNOSIS — I25119 Atherosclerotic heart disease of native coronary artery with unspecified angina pectoris: Secondary | ICD-10-CM

## 2024-05-21 DIAGNOSIS — I4819 Other persistent atrial fibrillation: Secondary | ICD-10-CM

## 2024-05-21 DIAGNOSIS — I6523 Occlusion and stenosis of bilateral carotid arteries: Secondary | ICD-10-CM

## 2024-05-21 NOTE — Progress Notes (Signed)
 Chief Complaint  Patient presents with   Follow-up    CAD   History of Present Illness: 89 yo male with history of CAD s/p CABG, atrial fibrillation, HTN, HLD, COPD, carotid artery disease and statin intolerance here today for cardiac follow up. He had a 5 vessel CABG in 2002 (LIMA to LAD, SVG to D1, sequential SVG to PDA and PLA, SVG to OM1)and a drug eluting stent placed in the Circumflex in 2009. 4 bypass grafts were noted to be patent by cath in 2009. The vein graft to the PDA did not supply the PLA as reported in the op report. He is known to have PAF but has refused anti-coagulation in the past. Echo January 2015 with normal LV systolic function, trivial AI, mild MR. He has been on ASA only. He has moderate carotid artery disease, stable by dopplers January 2022. He was admitted to [facility] June 2020 with a NSTEMI and had a drug eluting stent placed in the vein graft to the PDA. Also with severe disease in the vein graft to the diagonal and the vein graft to the OM with patent LIMA to LAD. He was re-admitted to [facility] 03/07/19 and had cardiac cath with placement of drug eluting stents in the vein graft to the Diagonal and in the vein graft to the OM. Echo June 2020 with normal LV systolic function and no significant valve disease. He was seen in our office in July 2023 with angina. Cardiac cath 04/12/22 with patent LIMA to LAD, patent SVG to OM. There was severe disease in the SVG to Diagonal that was treated with a drug eluting stent. There was severe disease in the SVG to RCA treated with 3 drug eluting stents. He was admitted to an outside hospital in April 2025 and found to have sepsis and pneumonia. Echo in outside hospital April 2025 with LVEF=40-45%. Echo in our office June 2025 with LVEF=50%.   He is here today for follow up. The patient denies any chest pain, palpitations, lower extremity edema, orthopnea, PND, dizziness, near syncope or syncope. Baseline dyspnea, unchanged.    Primary Care  Physician: Anita Bernardino BROCKS, FNP  Past Medical History:  Diagnosis Date   Anxiety    mild   Atrial fibrillation (HCC)    CAD (coronary artery disease)    DES proximal circumflex January, 2009  /   nuclear, December, 2011, no ischemia, ejection fraction 70% 02/20/19 PCI/DES to SVG-rPDA 03/10/19 PCI/DES x1 to SVG-Diag, DESx2 to SVG to OM   Carotid artery disease (HCC)    Doppler, August, 2011, zero 39% RIC A., 60-79% LICA, stable   Chronic headaches    COPD (chronic obstructive pulmonary disease) (HCC)    Diabetic peripheral neuropathy (HCC) 01/22/2018   Dyslipidemia    statin intolerance   Ejection fraction    EF 70%, nuclear, December, 2011   GERD (gastroesophageal reflux disease)    Hiatal hernia    Hx of CABG    Hendrickson, 2002   Hypercholesterolemia    Hypertension    MGUS (monoclonal gammopathy of unknown significance) 01/22/2018   Prostate cancer (HCC)    TUR and implant   Rash    2009, etiology not clear, not from Plavix , Claritin helped   Statin intolerance     Past Surgical History:  Procedure Laterality Date   BLADDER SURGERY     CARDIAC SURGERY     CORONARY STENT INTERVENTION N/A 02/19/2019   Procedure: CORONARY STENT INTERVENTION;  Surgeon: Darron Deatrice LABOR, MD;  Location: [facility] INVASIVE CV LAB;  Service: Cardiovascular;  Laterality: N/A;   CORONARY STENT INTERVENTION N/A 03/10/2019   Procedure: CORONARY STENT INTERVENTION;  Surgeon: Verlin Lonni BIRCH, MD;  Location: [facility] INVASIVE CV LAB;  Service: Cardiovascular;  Laterality: N/A;   CORONARY STENT INTERVENTION N/A 04/12/2022   Procedure: CORONARY STENT INTERVENTION;  Surgeon: Wonda Sharper, MD;  Location: [facility] INVASIVE CV LAB;  Service: Cardiovascular;  Laterality: N/A;   LEFT HEART CATH AND CORS/GRAFTS ANGIOGRAPHY N/A 02/19/2019   Procedure: LEFT HEART CATH AND CORS/GRAFTS ANGIOGRAPHY;  Surgeon: Darron Deatrice LABOR, MD;  Location: [facility] INVASIVE CV LAB;  Service: Cardiovascular;  Laterality: N/A;   LEFT HEART CATH AND  CORS/GRAFTS ANGIOGRAPHY N/A 03/10/2019   Procedure: LEFT HEART CATH AND CORS/GRAFTS ANGIOGRAPHY;  Surgeon: Verlin Lonni BIRCH, MD;  Location: [facility] INVASIVE CV LAB;  Service: Cardiovascular;  Laterality: N/A;   LEFT HEART CATH AND CORS/GRAFTS ANGIOGRAPHY N/A 04/12/2022   Procedure: LEFT HEART CATH AND CORS/GRAFTS ANGIOGRAPHY;  Surgeon: Wonda Sharper, MD;  Location: [facility] INVASIVE CV LAB;  Service: Cardiovascular;  Laterality: N/A;   PROSTATE SURGERY      Current Outpatient Medications  Medication Sig Dispense Refill   acetaminophen  (TYLENOL ) 500 MG tablet Take 500 mg by mouth every 6 (six) hours as needed for mild pain (pain score 1-3) or moderate pain (pain score 4-6).     Ascorbic Acid  (VITAMIN C  WITH ROSE HIPS) 1000 MG tablet Take 1,000 mg by mouth daily.     aspirin  EC 81 MG tablet Take 81 mg by mouth daily.     clopidogrel  (PLAVIX ) 75 MG tablet TAKE 1 TABLET BY MOUTH EVERY DAY WITH BREAKFAST (Patient taking differently: Take 75 mg by mouth daily.) 90 tablet 3   colchicine 0.6 MG tablet Take 0.6-1.2 mg by mouth daily. Take two tablets 1.2 at the first sign of gout flare, take one tablet 0.6 mg one hour later.     ezetimibe  (ZETIA ) 10 MG tablet TAKE 1 TABLET(10 MG) BY MOUTH DAILY (Patient taking differently: Take 10 mg by mouth at bedtime.) 90 tablet 3   glipiZIDE (GLUCOTROL XL) 2.5 MG 24 hr tablet Take 2.5 mg by mouth daily.     isosorbide  mononitrate (IMDUR ) 30 MG 24 hr tablet Take 0.5 tablets (15 mg total) by mouth daily. 90 tablet 3   lansoprazole (PREVACID) 15 MG capsule Take 15 mg by mouth daily as needed (heartburn).     nitroGLYCERIN  (NITROSTAT ) 0.4 MG SL tablet Place 1 tablet (0.4 mg total) under the tongue every 5 (five) minutes x 3 doses as needed for chest pain. 25 tablet 2   Omega-3 Fatty Acids  (FISH OIL) 1200 MG CAPS Take 1,200-2,400 mg by mouth See admin instructions. Take 2400 mg in the morning and 1200 mg in the evening     Polyethyl Glycol-Propyl Glycol (SYSTANE OP) Place 1  drop into both eyes daily as needed (dry eyes).     triamterene -hydrochlorothiazide  (DYAZIDE ) 37.5-25 MG capsule Take 1 capsule by mouth daily.     vitamin B-12 (CYANOCOBALAMIN ) 1000 MCG tablet Take 1,000 mcg by mouth 2 (two) times daily.     No current facility-administered medications for this visit.    Allergies  Allergen Reactions   Metformin And Related Diarrhea and Other (See Comments)    flatulence   Allopurinol Other (See Comments)    Painful skin nodules   Percocet [Oxycodone-Acetaminophen ] Other (See Comments)    Dizziness and hallucinations    Statins Other (See Comments)    Hmg-Coa Reductase inhibitors,  Myalgias    Social History   Socioeconomic History   Marital status: Married    Spouse name: Not on file   Number of children: Not on file   Years of education: Not on file   Highest education level: Not on file  Occupational History   Not on file  Tobacco Use   Smoking status: Never   Smokeless tobacco: Never  Vaping Use   Vaping status: Never Used  Substance and Sexual Activity   Alcohol use: No   Drug use: No   Sexual activity: Not on file  Other Topics Concern   Not on file  Social History Narrative   Not on file   Social Drivers of Health   Financial Resource Strain: Not on file  Food Insecurity: Low Risk  (02/08/2024)   Received from [facility]   Hunger Vital Sign    Within the past 12 months, you worried that your food would run out before you got money to buy more: Never true    Within the past 12 months, the food you bought just didn't last and you didn't have money to get more. : Never true  Transportation Needs: No Transportation Needs (02/08/2024)   Received from [facility]    In the past 12 months, has lack of reliable transportation kept you from medical appointments, meetings, work or from getting things needed for daily living? : No  Physical Activity: Not on file  Stress: Not on file  Social Connections:  Socially Isolated (12/30/2023)   Social Connection and Isolation Panel    Frequency of Communication with Friends and Family: More than three times a week    Frequency of Social Gatherings with Friends and Family: Twice a week    Attends Religious Services: Never    Database administrator or Organizations: No    Attends Banker Meetings: Never    Marital Status: Widowed  Intimate Partner Violence: Not At Risk (12/30/2023)   Humiliation, Afraid, Rape, and Kick questionnaire    Fear of Current or Ex-Partner: No    Emotionally Abused: No    Physically Abused: No    Sexually Abused: No    Family History  Problem Relation Age of Onset   Diabetes Sister    Diabetes Brother     Review of Systems:  As stated in the HPI and otherwise negative.   BP 134/84   Pulse (!) 58   Ht 5' 9.5 (1.765 m)   Wt 179 lb 3.2 oz (81.3 kg)   SpO2 98%   BMI 26.08 kg/m   Physical Examination: General: Well developed, well nourished, NAD  HEENT: OP clear, mucus membranes moist  SKIN: warm, dry. No rashes. Neuro: No focal deficits  Musculoskeletal: Muscle strength 5/5 all ext  Psychiatric: Mood and affect normal  Neck: No JVD, no carotid bruits, no thyromegaly, no lymphadenopathy.  Lungs:Clear bilaterally, no wheezes, rhonci, crackles Cardiovascular: Regular rate and rhythm. No murmurs, gallops or rubs. Abdomen:Soft. Bowel sounds present. Non-tender.  Extremities: No lower extremity edema. Pulses are 2 + in the bilateral DP/PT.    EKG:  EKG is not ordered today. The ekg ordered today demonstrates  Recent Labs: 01/02/2024: Magnesium 1.7; TSH 0.887 01/03/2024: ALT 72 01/22/2024: BUN 26; Creatinine, Ser 1.06; Hemoglobin 12.6; NT-Pro BNP 3,786; Platelets 198; Potassium 5.1; Sodium 140   Lipid Panel Followed in primary care   Wt Readings from Last 3 Encounters:  05/21/24 179 lb 3.2 oz (81.3 kg)  01/22/24 172 lb 6.4 oz (78.2 kg)  12/30/23 179 lb 7.3 oz (81.4 kg)    Assessment and Plan:    1. CAD s/p CABG with angina: No chest pain. Continue ASA, Plavix  and Zetia . He is statin intolerant.   2. Persistent atrial fibrillation: He is in atrial fib today with good rate control. He refuses anti-coagulation. Continue beta blocker.    3. HTN: BP is controlled. Continue current therapy  4. HLD: He statin intolerant. He has refused to consider Praluent or Repatha. Lipids followed in primary care. Continue Zetia .   5. Carotid artery disease: Moderate disease by dopplers January 2023. Will not repeat given advanced age.   Labs/ tests ordered today include:  No orders of the defined types were placed in this encounter.  Disposition:   F/U with me in 12  months   Signed, Lonni Cash, MD 05/21/2024 10:03 AM    [facility] [address] [place], [place], [place]  [address] Phone: [phone]; Fax: [phone]

## 2024-07-20 ENCOUNTER — Other Ambulatory Visit: Payer: Self-pay | Admitting: Cardiovascular Disease

## 2024-10-20 ENCOUNTER — Other Ambulatory Visit: Payer: Self-pay

## 2024-10-23 MED ORDER — EZETIMIBE 10 MG PO TABS: ORAL_TABLET | ORAL | 2 refills | Status: AC

## 2024-10-23 NOTE — Telephone Encounter (Signed)
 Pt had labs done on 1/302026 in careeverywhere within 365 days within normal range and lipid panel done on 12/14/2023 within 12 months in careeverywhere.
